# Patient Record
Sex: Male | Born: 1960
Health system: Southern US, Community
[De-identification: ages and names within clinical notes are randomized; demographics above are authoritative.]

## PROBLEM LIST (undated history)

## (undated) DIAGNOSIS — M1990 Unspecified osteoarthritis, unspecified site: Secondary | ICD-10-CM

## (undated) DIAGNOSIS — I739 Peripheral vascular disease, unspecified: Secondary | ICD-10-CM

## (undated) DIAGNOSIS — F40240 Claustrophobia: Secondary | ICD-10-CM

## (undated) DIAGNOSIS — T8189XA Other complications of procedures, not elsewhere classified, initial encounter: Secondary | ICD-10-CM

## (undated) DIAGNOSIS — J449 Chronic obstructive pulmonary disease, unspecified: Secondary | ICD-10-CM

## (undated) DIAGNOSIS — K469 Unspecified abdominal hernia without obstruction or gangrene: Secondary | ICD-10-CM

## (undated) DIAGNOSIS — E119 Type 2 diabetes mellitus without complications: Secondary | ICD-10-CM

## (undated) DIAGNOSIS — J40 Bronchitis, not specified as acute or chronic: Secondary | ICD-10-CM

## (undated) HISTORY — PX: CARPAL TUNNEL RELEASE: SHX101

## (undated) HISTORY — PX: SHOULDER SURGERY: SHX246

---

## 2010-08-06 ENCOUNTER — Emergency Department (HOSPITAL_COMMUNITY): Admission: EM | Admit: 2010-08-06 | Discharge: 2010-08-06 | Payer: Self-pay | Admitting: Emergency Medicine

## 2011-03-17 ENCOUNTER — Emergency Department (HOSPITAL_COMMUNITY)
Admission: EM | Admit: 2011-03-17 | Discharge: 2011-03-17 | Disposition: A | Discharge status: Home / Self Care | Payer: Self-pay | Attending: Emergency Medicine | Admitting: Emergency Medicine

## 2011-03-17 DIAGNOSIS — S43429A Sprain of unspecified rotator cuff capsule, initial encounter: Secondary | ICD-10-CM | POA: Insufficient documentation

## 2011-03-17 DIAGNOSIS — E119 Type 2 diabetes mellitus without complications: Secondary | ICD-10-CM | POA: Insufficient documentation

## 2011-03-17 DIAGNOSIS — M25519 Pain in unspecified shoulder: Secondary | ICD-10-CM | POA: Insufficient documentation

## 2013-01-01 DIAGNOSIS — N476 Balanoposthitis: Secondary | ICD-10-CM | POA: Insufficient documentation

## 2014-09-05 DIAGNOSIS — E782 Mixed hyperlipidemia: Secondary | ICD-10-CM | POA: Insufficient documentation

## 2014-09-05 DIAGNOSIS — F17200 Nicotine dependence, unspecified, uncomplicated: Secondary | ICD-10-CM | POA: Insufficient documentation

## 2014-09-05 DIAGNOSIS — IMO0002 Reserved for concepts with insufficient information to code with codable children: Secondary | ICD-10-CM | POA: Insufficient documentation

## 2014-09-05 DIAGNOSIS — E1149 Type 2 diabetes mellitus with other diabetic neurological complication: Secondary | ICD-10-CM | POA: Insufficient documentation

## 2014-09-10 DIAGNOSIS — E8881 Metabolic syndrome: Secondary | ICD-10-CM | POA: Insufficient documentation

## 2015-01-24 DIAGNOSIS — F325 Major depressive disorder, single episode, in full remission: Secondary | ICD-10-CM | POA: Insufficient documentation

## 2016-12-15 DIAGNOSIS — G619 Inflammatory polyneuropathy, unspecified: Secondary | ICD-10-CM | POA: Insufficient documentation

## 2017-04-14 DIAGNOSIS — F324 Major depressive disorder, single episode, in partial remission: Secondary | ICD-10-CM | POA: Diagnosis not present

## 2017-04-14 DIAGNOSIS — E1142 Type 2 diabetes mellitus with diabetic polyneuropathy: Secondary | ICD-10-CM | POA: Diagnosis not present

## 2017-04-14 DIAGNOSIS — E782 Mixed hyperlipidemia: Secondary | ICD-10-CM | POA: Diagnosis not present

## 2017-04-14 DIAGNOSIS — E8881 Metabolic syndrome: Secondary | ICD-10-CM | POA: Diagnosis not present

## 2017-04-17 DIAGNOSIS — E8881 Metabolic syndrome: Secondary | ICD-10-CM | POA: Diagnosis not present

## 2017-04-17 DIAGNOSIS — F17210 Nicotine dependence, cigarettes, uncomplicated: Secondary | ICD-10-CM | POA: Diagnosis not present

## 2017-04-17 DIAGNOSIS — E668 Other obesity: Secondary | ICD-10-CM | POA: Diagnosis not present

## 2017-04-17 DIAGNOSIS — E1165 Type 2 diabetes mellitus with hyperglycemia: Secondary | ICD-10-CM | POA: Diagnosis not present

## 2017-04-17 DIAGNOSIS — E782 Mixed hyperlipidemia: Secondary | ICD-10-CM | POA: Diagnosis not present

## 2017-07-24 ENCOUNTER — Emergency Department (HOSPITAL_COMMUNITY)
Admission: EM | Admit: 2017-07-24 | Discharge: 2017-07-24 | Disposition: A | Discharge status: Home / Self Care | Payer: BLUE CROSS/BLUE SHIELD | Attending: Emergency Medicine | Admitting: Emergency Medicine

## 2017-07-24 ENCOUNTER — Emergency Department (HOSPITAL_COMMUNITY): Payer: BLUE CROSS/BLUE SHIELD

## 2017-07-24 ENCOUNTER — Encounter (HOSPITAL_COMMUNITY): Payer: Self-pay | Admitting: *Deleted

## 2017-07-24 DIAGNOSIS — M25511 Pain in right shoulder: Secondary | ICD-10-CM

## 2017-07-24 DIAGNOSIS — E119 Type 2 diabetes mellitus without complications: Secondary | ICD-10-CM | POA: Insufficient documentation

## 2017-07-24 DIAGNOSIS — F17210 Nicotine dependence, cigarettes, uncomplicated: Secondary | ICD-10-CM | POA: Insufficient documentation

## 2017-07-24 DIAGNOSIS — R52 Pain, unspecified: Secondary | ICD-10-CM

## 2017-07-24 HISTORY — DX: Type 2 diabetes mellitus without complications: E11.9

## 2017-07-24 MED ORDER — HYDROCODONE-ACETAMINOPHEN 5-325 MG PO TABS: 1.0000 | ORAL_TABLET | Freq: Four times a day (QID) | ORAL | 0 refills | Status: DC | PRN

## 2017-07-24 MED ORDER — HYDROCODONE-ACETAMINOPHEN 5-325 MG PO TABS
1.0000 | ORAL_TABLET | Freq: Once | ORAL | Status: AC
  Administered 2017-07-24: 1 via ORAL
  Filled 2017-07-24: qty 1

## 2017-07-24 NOTE — Discharge Instructions (Signed)
Take Tylenol for mild pain or the pain medicine prescribed for severe pain. Don't take Tylenol together with the pain medicine prescribed as the combination can be dangerous to your liver. Call your primary care physician on Monday, 07/28/2017 if severe pain or limited motion of your shoulder continues. You may need referral to an orthopedic specialist

## 2017-07-24 NOTE — ED Provider Notes (Signed)
[facility] DEPT Provider Note   CSN: [id number] Arrival date & time: 07/24/17  1806     History   Chief Complaint Chief Complaint  Patient presents with  . Shoulder Pain    HPI Derrick Clark is a 56 y.o. male. [place] of right shoulder pain onset 4 days ago. Pain is overlying the deltoid area. He has exquisite pain on abduction of right shoulder and cannot fully abduct right shoulder due to pain. Pain is improved with holding his shoulder still. He's treated himself with ibuprofen without adequate relief. He is uncertain about weakness. No injury no fever no other associated symptoms. No neck pain pain is not exacerbated by moving his neck. No numbness. No trauma. HPI  Past Medical History:  Diagnosis Date  . Diabetes mellitus without complication ([place])     There are no active problems to display for this patient.   History reviewed. No pertinent surgical history.     Home Medications    Prior to Admission medications   Not on File    Family History No family history on file.  Social History Social History  Substance Use Topics  . Smoking status: Current Every Day Smoker    Packs/day: 0.75    Types: Cigarettes  . Smokeless tobacco: Never Used  . Alcohol use No     Allergies   Jardiance [empagliflozin] and Sulfa antibiotics   Review of Systems Review of Systems  Musculoskeletal: Positive for arthralgias.       Right shoulder pain  Allergic/Immunologic: Positive for immunocompromised state.       Diabetic  All other systems reviewed and are negative.    Physical Exam Updated Vital Signs BP 134/81   Pulse 64   Temp 98.4 F (36.9 C) (Oral)   Resp 16   Ht 5\' 10"  (1.778 m)   Wt 127 kg (280 lb)   SpO2 96%   BMI 40.18 kg/m   Physical Exam  Constitutional: He appears well-developed and well-nourished.  HENT:  Head: Normocephalic and atraumatic.  Eyes: Pupils are equal, round, and reactive to light. Conjunctivae are normal.  Neck: Neck  supple. No tracheal deviation present. No thyromegaly present.  Cardiovascular: Normal rate and regular rhythm.   No murmur heard. Pulmonary/Chest: Effort normal and breath sounds normal.  Abdominal: Soft. Bowel sounds are normal. He exhibits no distension. There is no tenderness.  Musculoskeletal: Normal range of motion. He exhibits no edema or tenderness.  Right upper extremity without redness swelling or muscular atrophy. Limited abduction secondary to pain. He is tender over lying deltoid area. Radial pulse 2+. Good capillary refill. Grip strength 5 over 5 bilaterally in all other extremities without redness swelling or tenderness neurovascularly intact  Neurological: He is alert. Coordination normal.  Skin: Skin is warm and dry. No rash noted.  Psychiatric: He has a normal mood and affect.  Nursing note and vitals reviewed.    ED Treatments / Results  Labs (all labs ordered are listed, but only abnormal results are displayed) Labs Reviewed - No data to display  EKG  EKG Interpretation None       Radiology No results found.  Procedures Procedures (including critical care time)  Medications Ordered in ED Medications - No data to display   Initial Impression / Assessment and Plan / ED Course  I have reviewed the triage vital signs and the nursing notes.  Pertinent labs & imaging results that were available during my care of the patient were reviewed by me and  considered in my medical decision making (see chart for details).    No results found for this or any previous visit. Dg Shoulder Right  Result Date: 07/24/2017 CLINICAL DATA:  Right shoulder pain for 1 week, no known injury, initial encounter EXAM: RIGHT SHOULDER - 2+ VIEW COMPARISON:  None. FINDINGS: Degenerative changes of the acromioclavicular joint are seen. No acute fracture or dislocation is noted. Underlying bony thorax is within normal limits. No gross soft tissue abnormality is seen. IMPRESSION: No acute  abnormality noted. Electronically Signed   By: Inez Catalina M.D.   On: 07/24/2017 20:57  X-ray viewed by me. Symptoms consistent with degenerative arthritis. I strongly doubt cervical radiculopathy. Plan prescription [place] Controlled Substance reporting System queried Follow-up with primary care physician in 3 days Final Clinical Impressions(s) / ED Diagnoses  Diagnosis right shoulder pain Final diagnoses:  Pain    New Prescriptions New Prescriptions   No medications on file     Orlie Dakin, MD 07/24/17 2133

## 2017-07-24 NOTE — ED Notes (Signed)
R shoulder pain

## 2017-07-24 NOTE — ED Triage Notes (Signed)
Pt c/o right shoulder pain and weakness x 1 week. Denies injury. Pt reports the pain goes up into his neck and down to his elbow. Pt has limited movement of left arm.

## 2017-08-18 DIAGNOSIS — E1142 Type 2 diabetes mellitus with diabetic polyneuropathy: Secondary | ICD-10-CM | POA: Diagnosis not present

## 2017-08-18 DIAGNOSIS — E1165 Type 2 diabetes mellitus with hyperglycemia: Secondary | ICD-10-CM | POA: Diagnosis not present

## 2017-08-18 DIAGNOSIS — E8881 Metabolic syndrome: Secondary | ICD-10-CM | POA: Diagnosis not present

## 2017-08-18 DIAGNOSIS — E782 Mixed hyperlipidemia: Secondary | ICD-10-CM | POA: Diagnosis not present

## 2017-08-21 DIAGNOSIS — F17210 Nicotine dependence, cigarettes, uncomplicated: Secondary | ICD-10-CM | POA: Diagnosis not present

## 2017-08-21 DIAGNOSIS — M545 Low back pain: Secondary | ICD-10-CM | POA: Diagnosis not present

## 2017-08-21 DIAGNOSIS — E8881 Metabolic syndrome: Secondary | ICD-10-CM | POA: Diagnosis not present

## 2017-08-21 DIAGNOSIS — E1165 Type 2 diabetes mellitus with hyperglycemia: Secondary | ICD-10-CM | POA: Diagnosis not present

## 2017-08-21 DIAGNOSIS — E782 Mixed hyperlipidemia: Secondary | ICD-10-CM | POA: Diagnosis not present

## 2017-09-16 DIAGNOSIS — M719 Bursopathy, unspecified: Secondary | ICD-10-CM | POA: Diagnosis not present

## 2017-09-16 DIAGNOSIS — M25511 Pain in right shoulder: Secondary | ICD-10-CM | POA: Diagnosis not present

## 2017-09-16 DIAGNOSIS — M67919 Unspecified disorder of synovium and tendon, unspecified shoulder: Secondary | ICD-10-CM | POA: Diagnosis not present

## 2017-09-16 DIAGNOSIS — IMO0002 Reserved for concepts with insufficient information to code with codable children: Secondary | ICD-10-CM | POA: Insufficient documentation

## 2017-09-22 DIAGNOSIS — M7551 Bursitis of right shoulder: Secondary | ICD-10-CM | POA: Diagnosis not present

## 2017-09-22 DIAGNOSIS — M19011 Primary osteoarthritis, right shoulder: Secondary | ICD-10-CM | POA: Diagnosis not present

## 2017-09-22 DIAGNOSIS — M7581 Other shoulder lesions, right shoulder: Secondary | ICD-10-CM | POA: Diagnosis not present

## 2017-09-30 DIAGNOSIS — Z6841 Body Mass Index (BMI) 40.0 and over, adult: Secondary | ICD-10-CM | POA: Diagnosis not present

## 2017-09-30 DIAGNOSIS — J189 Pneumonia, unspecified organism: Secondary | ICD-10-CM | POA: Diagnosis not present

## 2017-10-06 DIAGNOSIS — J189 Pneumonia, unspecified organism: Secondary | ICD-10-CM | POA: Diagnosis not present

## 2017-10-06 DIAGNOSIS — Z6841 Body Mass Index (BMI) 40.0 and over, adult: Secondary | ICD-10-CM | POA: Diagnosis not present

## 2017-10-21 DIAGNOSIS — M7542 Impingement syndrome of left shoulder: Secondary | ICD-10-CM | POA: Diagnosis not present

## 2017-10-21 DIAGNOSIS — M75121 Complete rotator cuff tear or rupture of right shoulder, not specified as traumatic: Secondary | ICD-10-CM | POA: Diagnosis not present

## 2017-10-21 DIAGNOSIS — M25511 Pain in right shoulder: Secondary | ICD-10-CM | POA: Diagnosis not present

## 2017-10-26 DIAGNOSIS — Z23 Encounter for immunization: Secondary | ICD-10-CM | POA: Diagnosis not present

## 2017-11-12 DIAGNOSIS — E785 Hyperlipidemia, unspecified: Secondary | ICD-10-CM | POA: Diagnosis not present

## 2017-11-12 DIAGNOSIS — M7522 Bicipital tendinitis, left shoulder: Secondary | ICD-10-CM | POA: Diagnosis not present

## 2017-11-12 DIAGNOSIS — F17210 Nicotine dependence, cigarettes, uncomplicated: Secondary | ICD-10-CM | POA: Diagnosis not present

## 2017-11-12 DIAGNOSIS — M75122 Complete rotator cuff tear or rupture of left shoulder, not specified as traumatic: Secondary | ICD-10-CM | POA: Diagnosis not present

## 2017-11-12 DIAGNOSIS — M1990 Unspecified osteoarthritis, unspecified site: Secondary | ICD-10-CM | POA: Diagnosis not present

## 2017-11-12 DIAGNOSIS — F419 Anxiety disorder, unspecified: Secondary | ICD-10-CM | POA: Diagnosis not present

## 2017-11-12 DIAGNOSIS — Z888 Allergy status to other drugs, medicaments and biological substances status: Secondary | ICD-10-CM | POA: Diagnosis not present

## 2017-11-12 DIAGNOSIS — I10 Essential (primary) hypertension: Secondary | ICD-10-CM | POA: Diagnosis not present

## 2017-11-12 DIAGNOSIS — M7542 Impingement syndrome of left shoulder: Secondary | ICD-10-CM | POA: Diagnosis not present

## 2017-11-12 DIAGNOSIS — Z882 Allergy status to sulfonamides status: Secondary | ICD-10-CM | POA: Diagnosis not present

## 2017-11-12 DIAGNOSIS — E119 Type 2 diabetes mellitus without complications: Secondary | ICD-10-CM | POA: Diagnosis not present

## 2017-11-13 DIAGNOSIS — M7522 Bicipital tendinitis, left shoulder: Secondary | ICD-10-CM | POA: Diagnosis not present

## 2017-11-13 DIAGNOSIS — E119 Type 2 diabetes mellitus without complications: Secondary | ICD-10-CM | POA: Diagnosis not present

## 2017-11-13 DIAGNOSIS — M75122 Complete rotator cuff tear or rupture of left shoulder, not specified as traumatic: Secondary | ICD-10-CM | POA: Diagnosis not present

## 2017-11-13 DIAGNOSIS — F17210 Nicotine dependence, cigarettes, uncomplicated: Secondary | ICD-10-CM | POA: Diagnosis not present

## 2017-11-13 DIAGNOSIS — M7542 Impingement syndrome of left shoulder: Secondary | ICD-10-CM | POA: Diagnosis not present

## 2017-11-13 DIAGNOSIS — E785 Hyperlipidemia, unspecified: Secondary | ICD-10-CM | POA: Diagnosis not present

## 2017-11-13 DIAGNOSIS — I10 Essential (primary) hypertension: Secondary | ICD-10-CM | POA: Diagnosis not present

## 2017-11-13 DIAGNOSIS — M1990 Unspecified osteoarthritis, unspecified site: Secondary | ICD-10-CM | POA: Diagnosis not present

## 2017-11-13 DIAGNOSIS — Z888 Allergy status to other drugs, medicaments and biological substances status: Secondary | ICD-10-CM | POA: Diagnosis not present

## 2017-11-13 DIAGNOSIS — F419 Anxiety disorder, unspecified: Secondary | ICD-10-CM | POA: Diagnosis not present

## 2017-11-13 DIAGNOSIS — Z882 Allergy status to sulfonamides status: Secondary | ICD-10-CM | POA: Diagnosis not present

## 2017-11-23 DIAGNOSIS — Z9889 Other specified postprocedural states: Secondary | ICD-10-CM | POA: Diagnosis not present

## 2017-12-01 ENCOUNTER — Encounter (HOSPITAL_COMMUNITY): Payer: Self-pay

## 2017-12-01 ENCOUNTER — Ambulatory Visit (HOSPITAL_COMMUNITY): Payer: BLUE CROSS/BLUE SHIELD | Attending: Orthopedic Surgery

## 2017-12-01 DIAGNOSIS — M25612 Stiffness of left shoulder, not elsewhere classified: Secondary | ICD-10-CM | POA: Insufficient documentation

## 2017-12-01 DIAGNOSIS — R29898 Other symptoms and signs involving the musculoskeletal system: Secondary | ICD-10-CM | POA: Insufficient documentation

## 2017-12-01 DIAGNOSIS — M25512 Pain in left shoulder: Secondary | ICD-10-CM

## 2017-12-01 NOTE — Therapy (Signed)
Derrick Clark, [place], [address] Phone: [phone]   Fax:  [phone]  Occupational Therapy Evaluation  Patient Details  Name: Derrick Clark MRN: [id number] Date of Birth: February 10, 1961 Referring Provider: Remo Lipps Case, MD   Encounter Date: 12/01/2017  OT End of Session - 12/01/17 1539    Visit Number  1    Number of Visits  24    Date for OT Re-Evaluation  01/30/18 mini reassess: 12/30/17    Authorization Type  BC[employer] other     Authorization Time Period  30 visits combined OT/chiropractor 0 used.    Authorization - Visit Number  1    Authorization - Number of Visits  30    OT Start Time  1430    OT Stop Time  1515    OT Time Calculation (min)  45 min    Activity Tolerance  Patient tolerated treatment well    Behavior During Therapy  WFL for tasks assessed/performed       Past Medical History:  Diagnosis Date  . Diabetes mellitus without complication ([place])     History reviewed. No pertinent surgical history.  There were no vitals filed for this visit.  Subjective Assessment - 12/01/17 1440    Subjective   S: I think it was just from work and overuse.    Pertinent History  Patient is a 57 y/o male S/P left RCR which was completed on 11/13/17. Pt is currently wearing sling upon arrival to therapy. Dr. Case has referred patient to occupational therapy for evaluation and treatment.     Special Tests  FOTO score: 36/100     Patient Stated Goals  To return to work.    Currently in Pain?  Yes    Pain Score  4     Pain Location  Shoulder    Pain Orientation  Right    Pain Descriptors / Indicators  Dull    Pain Type  Surgical pain    Pain Radiating Towards  down to elbow    Pain Onset  1 to 4 weeks ago    Pain Frequency  Constant    Aggravating Factors   quick movements.     Pain Relieving Factors  rest. sleep, pain medication, ice pack    Effect of Pain on Daily Activities  Pt is unable to use LUE right now for any  tasks.     Multiple Pain Sites  No        [facility] OT Assessment - 12/01/17 1443      Assessment   Diagnosis  Left RCR    Referring Provider  Remo Lipps Case, MD    Onset Date  11/13/17 surgery    Assessment  12/23/17 - follow up with Case    Prior Therapy  None      Precautions   Precautions  Shoulder    Type of Shoulder Precautions  See protocol from Dr. 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      ADL   ADL comments  Pt is unable to use his left UE for any daily tasks at this time.       Mobility   Mobility Status  Independent      Written Expression   Dominant Hand  Right      Vision - History   Baseline Vision  No visual deficits      Cognition   Overall Cognitive Status  Within Functional Limits for tasks assessed      ROM / Strength   AROM / PROM / Strength  AROM;PROM;Strength      Palpation   Palpation comment  Mod fascial restrictions in left upper arm, trapezius, and scapularis region.       AROM   Overall AROM   Unable to assess;Due to precautions    AROM Assessment Site  Shoulder      PROM   Overall PROM Comments  Assessed supine. IR/er adducted.    PROM Assessment Site  Shoulder    Right/Left Shoulder  Left    Left Shoulder Flexion  114 Degrees    Left Shoulder ABduction  60 Degrees per protocol    Left Shoulder Internal Rotation  90 Degrees    Left Shoulder External Rotation  40 Degrees      Strength   Overall Strength  Unable to assess;Due to precautions                       OT Education - 12/01/17 1538    Education provided  Yes    Education Details  table slides, Wrist and elbow A/ROM, pendulums    Person(s) Educated  Patient    Methods  Explanation;Demonstration;Tactile cues;Verbal cues;Handout    Comprehension  Returned demonstration;Verbalized understanding       OT Short Term Goals - 12/01/17 1545      OT SHORT TERM GOAL #1   Title  Patient will be educated and independent with HEP to increase functional use of LUE during daily tasks.     Time  6    Period  Weeks    Status  New    Target Date  01/12/18      OT SHORT TERM GOAL #2   Title  patient will increase P/ROM of LUE to [facility] in order to increase ability to complete dressing tasks.    Time  6    Period  Weeks    Status  New      OT SHORT TERM GOAL #3   Title  Patient will increase LUE strength to 3+/5 to increase ability to complete waist level activities.     Time  6    Period  Weeks    Status  New      OT SHORT TERM GOAL #4   Title  Patient will decrease pain level in LUE during daily tasks to 3/10 or less.     Time  6    Period  Weeks    Status  New      OT SHORT TERM GOAL #5   Title  Patient will decrease fascial restrictions in LUE to min amount in order to increase functional mobility needed for reaching tasks.     Time  6    Period  Weeks    Status  New        OT Long Term Goals - 12/01/17  Reader #1   Title  Patient will return to highest level of independence using his LUE for all daily tasks.     Time  12    Period  Weeks    Status  New    Target Date  02/23/18      OT LONG TERM GOAL #2   Title  Patient will increase A/ROM to WNL to increase ability to complete overhead reaching tasks.     Time  12    Period  Weeks    Status  New      OT LONG TERM GOAL #3   Title  Patient will increase LUE strength to 4+/5 to increase ability to return to work related tasks.     Time  12    Period  Weeks    Status   New      OT LONG TERM GOAL #4   Title  Patient will decrease pain level to 2/10 or less when completing daily tasks.     Time  12    Period  Weeks    Status  New      OT LONG TERM GOAL #5   Title  Patient to decrease fascial restrictions in LUE to trace amount in order to increase functional mobility needed to return to reaching tasks.     Time  12    Period  Weeks    Status  New            Plan - 12/01/17 1541    Clinical Impression Statement  A: patient is a 56 y/o male S/P left RCR causing increased pain, fascial restrictions, and decreased ROM and strengthening resulting in difficulty completing daily tasks with LUE.     Occupational Profile and client history currently impacting functional performance  motivated to return to prior level of function, strong social support at home.     Occupational performance deficits (Please refer to evaluation for details):  ADL's;Rest and Sleep;Work;Leisure;IADL's    Rehab Potential  Excellent    Current Impairments/barriers affecting progress:  None    OT Frequency  2x / week    OT Duration  12 weeks    OT Treatment/Interventions  Self-care/ADL training;Ultrasound;DME and/or AE instruction;Patient/family education;Passive range of motion;Cryotherapy;Electrical Stimulation;Moist Heat;Manual Therapy;Therapeutic activities;Therapeutic exercise    Plan  P: Patient will benefit from skilled OT services to increase functional performance during daily tasks using his LUE. Treatment plan: follow protocol from Dr. Case. myofascial release, manual stretching, P/ROM, AA/ROM, A/ROM, and general strengthening.     Clinical Decision Making  Limited treatment options, no task modification necessary    OT Home Exercise Plan  12/4: table slides, elbow/wrist A/ROM, pendulums    Consulted and Agree with Plan of Care  Patient       Patient will benefit from skilled therapeutic intervention in order to improve the following deficits and impairments:   Decreased strength, Decreased range of motion, Pain, Impaired UE functional use, Increased fascial restrictions  Visit Diagnosis: Acute pain of left shoulder - Plan: Ot plan of care cert/re-cert  Other symptoms and signs involving the musculoskeletal system - Plan: Ot plan of care cert/re-cert  Stiffness of left shoulder, not elsewhere classified - Plan: Ot plan of care cert/re-cert  G-Codes - 09/32/67 1554    Functional Assessment Tool Used (Outpatient only)  FOTO score: 36/100 (64% impaired)    Functional Limitation  Carrying, moving and handling objects  Carrying, Moving and Handling Objects Current Status [phone])  At least 60 percent but less than 80 percent impaired, limited or restricted    Carrying, Moving and Handling Objects Goal Status (M7672)  At least 20 percent but less than 40 percent impaired, limited or restricted       Problem List There are no active problems to display for this patient.  Ailene Ravel, OTR/L,CBIS  [phone]  12/01/2017, 3:56 PM  [facility] [address] [place], [place], [address] Phone: [phone]   Fax:  [phone]  Name: Derrick Clark MRN: [id number] Date of Birth: [birth year]

## 2017-12-01 NOTE — Patient Instructions (Signed)
TOWEL SLIDES COMPLETE FOR 1-3 MINUTES, 3-5 TIMES PER DAY  SHOULDER: Flexion On Table   Place hands on table, elbows straight. Move hips away from body. Press hands down into table. Hold ___ seconds. ___ reps per set, ___ sets per day, ___ days per week     Internal Rotation (Assistive)   Seated with elbow bent at right angle and held against side, slide arm on table surface in an inward arc. Repeat ____ times. Do ____ sessions per day. Activity: Use this motion to brush crumbs off the table.  Copyright  VHI. All rights reserved.    COMPLETE PENDULUM EXERCISES FOR 30 SECONDS TO A MINUTE EACH, 3-5 TIMES PER DAY. ROM: Pendulum (Side-to-Side)   Deje que el brazo derecho se balancee suavemente de lado a lado mientras oscila el cuerpo de [place]. Repita ____ veces por rutina. Realice ____ rutinas por sesin. Realice ____ sesiones por da.  http://orth.exer.us/792   Copyright  VHI. All rights reserved.  Pendulum Forward/Back   Bend forward 90 at waist, using table for support. Rock body forward and back to swing arm. Repeat ____ times. Do ____ sessions per day.   AROM: Wrist Extension   With right palm down, bend wrist up. Repeat 10____ times per set. Do ____ sets per session. Do __3__ sessions per day.  Copyright  VHI. All rights reserved.   AROM: Wrist Flexion   With right palm up, bend wrist up. Repeat ___10_ times per set. Do ____ sets per session. Do __3__ sessions per day.  Copyright  VHI. All rights reserved.   AROM: Forearm Pronation / Supination   With right arm in handshake position, slowly rotate palm down until stretch is felt. Relax. Then rotate palm up until stretch is felt. Repeat __10__ times per set. Do ____ sets per session. Do __3__ sessions per day.  Copyright  VHI. All rights reserved.   AFlexion (Passive)   Use other hand to bend elbow, with thumb toward same shoulder. Do NOT force this motion. Hold ____ seconds. Repeat ____  times. Do ____ sessions per day.

## 2017-12-02 ENCOUNTER — Ambulatory Visit (HOSPITAL_COMMUNITY): Payer: BLUE CROSS/BLUE SHIELD

## 2017-12-02 DIAGNOSIS — R29898 Other symptoms and signs involving the musculoskeletal system: Secondary | ICD-10-CM

## 2017-12-02 DIAGNOSIS — M25612 Stiffness of left shoulder, not elsewhere classified: Secondary | ICD-10-CM | POA: Diagnosis not present

## 2017-12-02 DIAGNOSIS — M25512 Pain in left shoulder: Secondary | ICD-10-CM

## 2017-12-03 NOTE — Therapy (Signed)
[facility] [place], [place], [address] Phone: [phone]   Fax:  [phone]  Occupational Therapy Treatment  Patient Details  Name: Derrick Clark MRN: [id number] Date of Birth: [birth year] Referring Provider: Remo Lipps Case, MD   Encounter Date: 12/02/2017  OT End of Session - 12/02/17 6384    Visit Number  2    Number of Visits  24    Date for OT Re-Evaluation  01/30/18 mini reassess: 12/30/17    Authorization Type  BCBS other     Authorization Time Period  30 visits combined OT/chiropractor 0 used.    Authorization - Visit Number  2    Authorization - Number of Visits  30    OT Start Time  1520    OT Stop Time  1600    OT Time Calculation (min)  40 min    Activity Tolerance  Patient tolerated treatment well    Behavior During Therapy  WFL for tasks assessed/performed       Past Medical History:  Diagnosis Date  . Diabetes mellitus without complication ([place])     No past surgical history on file.  There were no vitals filed for this visit.  Subjective Assessment - 12/02/17 0818    Subjective   S: I did the exercises yesterday.    Currently in Pain?  Yes    Pain Score  5     Pain Location  Shoulder    Pain Orientation  Right    Pain Descriptors / Indicators  Dull    Pain Type  Surgical pain         [facility] OT Assessment - 12/02/17 0819      Assessment   Diagnosis  Left RCR      Precautions   Precautions  Shoulder    Type of Shoulder Precautions  See protocol from Dr. Remo Lipps Case    Shoulder Interventions  Shoulder sling/immobilizer               OT Treatments/Exercises (OP) - 12/02/17 1546      Exercises   Exercises  Shoulder      Shoulder Exercises: Supine   Protraction  PROM;10 reps    Horizontal ABduction  PROM;10 reps    External Rotation  PROM;10 reps    Internal Rotation  PROM;10 reps    Flexion  PROM;10 reps    ABduction  PROM;10 reps      Shoulder Exercises: Seated   Extension   AROM;10 reps    Row  AROM;10 reps    Other Seated Exercises  Shoulder depression; 10X      Shoulder Exercises: Therapy Ball   Flexion  10 reps    ABduction  10 reps protocol      Shoulder Exercises: ROM/Strengthening   Thumb Tacks  1'    Anterior Glide  3x10"    Caudal Glide  3x10"      Manual Therapy   Manual Therapy  Myofascial release    Manual therapy comments  Manual therapy completed prior to exercises    Myofascial Release  Myofascial release and manual stretching completed to left upper arm, trapezius, and scapularis region to decrease fascial restrictions and increase joint mobility in a pain free zone.              OT Education - 12/02/17 0820    Education provided  Yes    Education Details  pt provided with OT evaluation print out. Reviewed goals  and plan of care.    Person(s) Educated  Patient    Methods  Explanation;Handout    Comprehension  Verbalized understanding       OT Short Term Goals - 12/02/17 0824      OT SHORT TERM GOAL #1   Title  Patient will be educated and independent with HEP to increase functional use of LUE during daily tasks.     Time  6    Period  Weeks    Status  On-going      OT SHORT TERM GOAL #2   Title  patient will increase P/ROM of LUE to [facility] in order to increase ability to complete dressing tasks.    Time  6    Period  Weeks    Status  On-going      OT SHORT TERM GOAL #3   Title  Patient will increase LUE strength to 3+/5 to increase ability to complete waist level activities.     Time  6    Period  Weeks    Status  On-going      OT SHORT TERM GOAL #4   Title  Patient will decrease pain level in LUE during daily tasks to 3/10 or less.     Time  6    Period  Weeks    Status  On-going      OT SHORT TERM GOAL #5   Title  Patient will decrease fascial restrictions in LUE to min amount in order to increase functional mobility needed for reaching tasks.     Time  6    Period  Weeks    Status  On-going        OT  Long Term Goals - 12/03/17 0825      OT LONG TERM GOAL #1   Title  Patient will return to highest level of independence using his LUE for all daily tasks.     Time  12    Period  Weeks    Status  On-going      OT LONG TERM GOAL #2   Title  Patient will increase A/ROM to WNL to increase ability to complete overhead reaching tasks.     Time  12    Period  Weeks    Status  On-going      OT LONG TERM GOAL #3   Title  Patient will increase LUE strength to 4+/5 to increase ability to return to work related tasks.     Time  12    Period  Weeks    Status  On-going      OT LONG TERM GOAL #4   Title  Patient will decrease pain level to 2/10 or less when completing daily tasks.     Time  12    Period  Weeks    Status  On-going      OT LONG TERM GOAL #5   Title  Patient to decrease fascial restrictions in LUE to trace amount in order to increase functional mobility needed to return to reaching tasks.     Time  12    Period  Weeks    Status  On-going            Plan - 12/02/17 5631    Clinical Impression Statement  A: Initiated myofascial release, manual stretching, therapy ball stretching following protocol. Patient was able to achieve more passive ROM for shoulder flexion this session. VC for form and technique.    Plan  P: Add pro/ret/elev/dep       Patient will benefit from skilled therapeutic intervention in order to improve the following deficits and impairments:  Decreased strength, Decreased range of motion, Pain, Impaired UE functional use, Increased fascial restrictions  Visit Diagnosis: Acute pain of left shoulder  Other symptoms and signs involving the musculoskeletal system  Stiffness of left shoulder, not elsewhere classified    Problem List There are no active problems to display for this patient.  Ailene Ravel, OTR/L,CBIS  [phone]  12/03/2017, 10:10 AM  [facility] [address] [place],  [place], [address] Phone: [phone]   Fax:  [phone]  Name: Derrick Clark MRN: [id number] Date of Birth: October 03, 1961

## 2017-12-09 ENCOUNTER — Ambulatory Visit (HOSPITAL_COMMUNITY): Payer: BLUE CROSS/BLUE SHIELD

## 2017-12-09 ENCOUNTER — Encounter (HOSPITAL_COMMUNITY): Payer: Self-pay

## 2017-12-09 DIAGNOSIS — M25612 Stiffness of left shoulder, not elsewhere classified: Secondary | ICD-10-CM | POA: Diagnosis not present

## 2017-12-09 DIAGNOSIS — R29898 Other symptoms and signs involving the musculoskeletal system: Secondary | ICD-10-CM

## 2017-12-09 DIAGNOSIS — M25512 Pain in left shoulder: Secondary | ICD-10-CM | POA: Diagnosis not present

## 2017-12-09 NOTE — Therapy (Signed)
[place], [place], [address] Phone: [phone]   Fax:  [phone]  Occupational Therapy Treatment  Patient Details  Name: Derrick Clark MRN: [id number] Date of Birth: 1961/09/27 Referring Provider (Historical): Remo Lipps Case, MD   Encounter Date: 12/09/2017  OT End of Session - 12/09/17 1723    Visit Number  3    Number of Visits  24    Date for OT Re-Evaluation  01/30/18 mini reassess: 12/30/17    Authorization Type  [employer] other     Authorization Time Period  30 visits combined OT/chiropractor 0 used.    Authorization - Visit Number  3    Authorization - Number of Visits  30    OT Start Time  2563    OT Stop Time  8937    OT Time Calculation (min)  30 min    Activity Tolerance  Patient tolerated treatment well    Behavior During Therapy  WFL for tasks assessed/performed       Past Medical History:  Diagnosis Date  . Diabetes mellitus without complication ([place])     History reviewed. No pertinent surgical history.  There were no vitals filed for this visit.  Subjective Assessment - 12/09/17 1704    Subjective   S: Nothing new to report.    Currently in Pain?  Yes    Pain Score  6     Pain Location  Shoulder    Pain Orientation  Left    Pain Descriptors / Indicators  Dull    Pain Type  Surgical pain    Pain Radiating Towards  down to elbow    Pain Onset  1 to 4 weeks ago    Pain Frequency  Constant    Aggravating Factors   quick movements    Pain Relieving Factors  rest, sleep, pain medication, ice pack    Effect of Pain on Daily Activities  Unable to use LUE for daily tasks at this time.                   OT Treatments/Exercises (OP) - 12/09/17 1706      Exercises   Exercises  Shoulder      Shoulder Exercises: Supine   Protraction  PROM;10 reps    Horizontal ABduction  PROM;10 reps    External Rotation  PROM;10 reps    Internal Rotation  PROM;10 reps    Flexion  PROM;10 reps     ABduction  PROM;10 reps      Shoulder Exercises: Seated   Extension  AROM;12 reps    Row  AROM;12 reps    Other Seated Exercises  Shoulder depression; 12X      Shoulder Exercises: Therapy Ball   Flexion  10 reps    ABduction  10 reps protocol      Shoulder Exercises: ROM/Strengthening   Thumb Tacks  1'    Anterior Glide  3x10"    Caudal Glide  3x10"    Prot/Ret//Elev/Dep  1'      Shoulder Exercises: Isometric Strengthening   Flexion  Supine;3X5"    Extension  Supine;3X5"    External Rotation  Supine;3X5"    Internal Rotation  Supine;3X5"    ABduction  Supine;3X5"    ADduction  Supine;3X5"      Manual Therapy   Manual Therapy  Myofascial release    Manual therapy comments  Manual therapy completed prior to exercises    Myofascial Release  Myofascial release and manual stretching completed to left upper arm, trapezius, and scapularis region to decrease fascial restrictions and increase joint mobility in a pain free zone.                OT Short Term Goals - 12/02/17 0998      OT SHORT TERM GOAL #1   Title  Patient will be educated and independent with HEP to increase functional use of LUE during daily tasks.     Time  6    Period  Weeks    Status  On-going      OT SHORT TERM GOAL #2   Title  patient will increase P/ROM of LUE to [facility] in order to increase ability to complete dressing tasks.    Time  6    Period  Weeks    Status  On-going      OT SHORT TERM GOAL #3   Title  Patient will increase LUE strength to 3+/5 to increase ability to complete waist level activities.     Time  6    Period  Weeks    Status  On-going      OT SHORT TERM GOAL #4   Title  Patient will decrease pain level in LUE during daily tasks to 3/10 or less.     Time  6    Period  Weeks    Status  On-going      OT SHORT TERM GOAL #5   Title  Patient will decrease fascial restrictions in LUE to min amount in order to increase functional mobility needed for reaching tasks.     Time   6    Period  Weeks    Status  On-going        OT Long Term Goals - 12/03/17 0825      OT LONG TERM GOAL #1   Title  Patient will return to highest level of independence using his LUE for all daily tasks.     Time  12    Period  Weeks    Status  On-going      OT LONG TERM GOAL #2   Title  Patient will increase A/ROM to WNL to increase ability to complete overhead reaching tasks.     Time  12    Period  Weeks    Status  On-going      OT LONG TERM GOAL #3   Title  Patient will increase LUE strength to 4+/5 to increase ability to return to work related tasks.     Time  12    Period  Weeks    Status  On-going      OT LONG TERM GOAL #4   Title  Patient will decrease pain level to 2/10 or less when completing daily tasks.     Time  12    Period  Weeks    Status  On-going      OT LONG TERM GOAL #5   Title  Patient to decrease fascial restrictions in LUE to trace amount in order to increase functional mobility needed to return to reaching tasks.     Time  12    Period  Weeks    Status  On-going            Plan - 12/09/17 1729    Clinical Impression Statement  A: Continued to follow protocol. Added isometrics this session and pro/ret/elev/dep. VC for form and technique.     Plan  P: Continue to follow protocol.        Patient will benefit from skilled therapeutic intervention in order to improve the following deficits and impairments:  Decreased strength, Decreased range of motion, Pain, Impaired UE functional use, Increased fascial restrictions  Visit Diagnosis: Other symptoms and signs involving the musculoskeletal system  Stiffness of left shoulder, not elsewhere classified  Acute pain of left shoulder    Problem List There are no active problems to display for this patient.  Ailene Ravel, OTR/L,CBIS  [phone]  12/09/2017, 5:31 PM  [facility] [address] [place], [place], [address] Phone:  [phone]   Fax:  [phone]  Name: Derrick Clark MRN: [id number] Date of Birth: [birth year]

## 2017-12-11 ENCOUNTER — Encounter (HOSPITAL_COMMUNITY): Payer: Self-pay

## 2017-12-11 ENCOUNTER — Ambulatory Visit (HOSPITAL_COMMUNITY): Payer: BLUE CROSS/BLUE SHIELD

## 2017-12-11 DIAGNOSIS — M25612 Stiffness of left shoulder, not elsewhere classified: Secondary | ICD-10-CM | POA: Diagnosis not present

## 2017-12-11 DIAGNOSIS — M25512 Pain in left shoulder: Secondary | ICD-10-CM | POA: Diagnosis not present

## 2017-12-11 DIAGNOSIS — R29898 Other symptoms and signs involving the musculoskeletal system: Secondary | ICD-10-CM | POA: Diagnosis not present

## 2017-12-11 NOTE — Therapy (Signed)
[place], [place], [address] Phone: [phone]   Fax:  [phone]  Occupational Therapy Treatment  Patient Details  Name: Derrick Clark MRN: [id number] Date of Birth: 06/10/1961 Referring Provider (Historical): Remo Lipps Case, MD   Encounter Date: 12/11/2017  OT End of Session - 12/11/17 2239    Visit Number  4    Number of Visits  24    Date for OT Re-Evaluation  01/30/18 mini reassess: 12/30/17    Authorization Type  [employer] other     Authorization Time Period  30 visits combined OT/chiropractor 0 used.    Authorization - Visit Number  3    Authorization - Number of Visits  30    OT Start Time  3295    OT Stop Time  1115    OT Time Calculation (min)  40 min    Activity Tolerance  Patient tolerated treatment well    Behavior During Therapy  WFL for tasks assessed/performed       Past Medical History:  Diagnosis Date  . Diabetes mellitus without complication ([place])     History reviewed. No pertinent surgical history.  There were no vitals filed for this visit.  Subjective Assessment - 12/11/17 1054    Subjective   S: Nothing new to report.    Currently in Pain?  Yes    Pain Score  6     Pain Location  Shoulder    Pain Orientation  Left                   OT Treatments/Exercises (OP) - 12/11/17 1055      Exercises   Exercises  Shoulder      Shoulder Exercises: Supine   Protraction  PROM;10 reps    External Rotation  PROM;10 reps    Internal Rotation  PROM;10 reps    Flexion  PROM;10 reps    ABduction  PROM;10 reps      Shoulder Exercises: Seated   Extension  AROM;12 reps    Row  AROM;12 reps    Other Seated Exercises  Shoulder depression; 12X      Shoulder Exercises: Therapy Ball   Flexion  15 reps    ABduction  15 reps protocol      Shoulder Exercises: ROM/Strengthening   Pendulum  forward/back and side to side using theraband for visual cue. 1 minute each.    Anterior Glide   5x10"    Caudal Glide  5x10"      Shoulder Exercises: Isometric Strengthening   Flexion  -- Hold isometrics until week 8.      Manual Therapy   Manual Therapy  Myofascial release    Manual therapy comments  Manual therapy completed prior to exercises    Myofascial Release  Myofascial release and manual stretching completed to left upper arm, trapezius, and scapularis region to decrease fascial restrictions and increase joint mobility in a pain free zone.                OT Short Term Goals - 12/02/17 1884      OT SHORT TERM GOAL #1   Title  Patient will be educated and independent with HEP to increase functional use of LUE during daily tasks.     Time  6    Period  Weeks    Status  On-going      OT SHORT TERM GOAL #2   Title  patient will increase P/ROM  of LUE to [facility] in order to increase ability to complete dressing tasks.    Time  6    Period  Weeks    Status  On-going      OT SHORT TERM GOAL #3   Title  Patient will increase LUE strength to 3+/5 to increase ability to complete waist level activities.     Time  6    Period  Weeks    Status  On-going      OT SHORT TERM GOAL #4   Title  Patient will decrease pain level in LUE during daily tasks to 3/10 or less.     Time  6    Period  Weeks    Status  On-going      OT SHORT TERM GOAL #5   Title  Patient will decrease fascial restrictions in LUE to min amount in order to increase functional mobility needed for reaching tasks.     Time  6    Period  Weeks    Status  On-going        OT Long Term Goals - 12/03/17 0825      OT LONG TERM GOAL #1   Title  Patient will return to highest level of independence using his LUE for all daily tasks.     Time  12    Period  Weeks    Status  On-going      OT LONG TERM GOAL #2   Title  Patient will increase A/ROM to WNL to increase ability to complete overhead reaching tasks.     Time  12    Period  Weeks    Status  On-going      OT LONG TERM GOAL #3   Title  Patient  will increase LUE strength to 4+/5 to increase ability to return to work related tasks.     Time  12    Period  Weeks    Status  On-going      OT LONG TERM GOAL #4   Title  Patient will decrease pain level to 2/10 or less when completing daily tasks.     Time  12    Period  Weeks    Status  On-going      OT LONG TERM GOAL #5   Title  Patient to decrease fascial restrictions in LUE to trace amount in order to increase functional mobility needed to return to reaching tasks.     Time  12    Period  Weeks    Status  On-going            Plan - 12/11/17 2240    Clinical Impression Statement  A: Continued to follow protocol. Pt required VC for form and technique especially for pendulum exercises.     Plan  P: Continue to follow protocol.        Patient will benefit from skilled therapeutic intervention in order to improve the following deficits and impairments:     Visit Diagnosis: Other symptoms and signs involving the musculoskeletal system  Stiffness of left shoulder, not elsewhere classified  Acute pain of left shoulder    Problem List There are no active problems to display for this patient.  Ailene Ravel, OTR/L,CBIS  [phone]  12/11/2017, 10:49 PM  [facility] [address] [place], [place], [address] Phone: [phone]   Fax:  [phone]  Name: Derrick Clark MRN: [id number] Date of Birth: [birth year]

## 2017-12-14 ENCOUNTER — Telehealth (HOSPITAL_COMMUNITY): Payer: Self-pay

## 2017-12-14 ENCOUNTER — Ambulatory Visit (HOSPITAL_COMMUNITY): Payer: BLUE CROSS/BLUE SHIELD

## 2017-12-14 NOTE — Telephone Encounter (Signed)
Patient is out of town and will not be back in time for his appt.

## 2017-12-16 ENCOUNTER — Encounter (HOSPITAL_COMMUNITY): Payer: Self-pay

## 2017-12-16 ENCOUNTER — Ambulatory Visit (HOSPITAL_COMMUNITY): Payer: BLUE CROSS/BLUE SHIELD

## 2017-12-16 DIAGNOSIS — R29898 Other symptoms and signs involving the musculoskeletal system: Secondary | ICD-10-CM | POA: Diagnosis not present

## 2017-12-16 DIAGNOSIS — M25512 Pain in left shoulder: Secondary | ICD-10-CM | POA: Diagnosis not present

## 2017-12-16 DIAGNOSIS — M25612 Stiffness of left shoulder, not elsewhere classified: Secondary | ICD-10-CM

## 2017-12-16 NOTE — Therapy (Signed)
[place], [place], [address] Phone: [phone]   Fax:  [phone]  Occupational Therapy Treatment  Patient Details  Name: Derrick Clark MRN: [id number] Date of Birth: 02/18/1961 Referring Provider (Historical): Remo Lipps Case, MD   Encounter Date: 12/16/2017  OT End of Session - 12/16/17 1736    Visit Number  5    Number of Visits  24    Date for OT Re-Evaluation  01/30/18 mini reassess: 12/30/17    Authorization Type  [employer] other     Authorization Time Period  30 visits combined OT/chiropractor 0 used.    Authorization - Visit Number  4    Authorization - Number of Visits  30    OT Start Time  1975    OT Stop Time  1730    OT Time Calculation (min)  40 min    Activity Tolerance  Patient tolerated treatment well    Behavior During Therapy  WFL for tasks assessed/performed       Past Medical History:  Diagnosis Date  . Diabetes mellitus without complication ([place])     History reviewed. No pertinent surgical history.  There were no vitals filed for this visit.  Subjective Assessment - 12/16/17 2244    Subjective   S: I have a little soreness.    Currently in Pain?  Yes    Pain Score  3     Pain Location  Shoulder    Pain Orientation  Left    Pain Descriptors / Indicators  Dull    Pain Type  Surgical pain    Pain Radiating Towards  down to elbow    Pain Frequency  Constant    Aggravating Factors   quick movements    Pain Relieving Factors  rest, sleep, ice pack, pain medication    Effect of Pain on Daily Activities  unable to use LUE for daily tasks at this time.    Multiple Pain Sites  No         [facility] OT Assessment - 12/16/17 2245      Assessment   Medical Diagnosis  Left RCR    Referring Provider  Remo Lipps Case, MD    Onset Date/Surgical Date  11/13/17      Precautions   Precautions  Shoulder    Type of Shoulder Precautions  See protocol from Dr. Remo Lipps Case    Shoulder Interventions   Shoulder sling/immobilizer               OT Treatments/Exercises (OP) - 12/16/17 2246      Exercises   Exercises  Shoulder      Shoulder Exercises: Supine   Protraction  PROM;10 reps    External Rotation  PROM;10 reps    Internal Rotation  PROM;10 reps    Flexion  PROM;10 reps    ABduction  PROM;10 reps      Shoulder Exercises: Seated   Extension  AROM;15 reps    Row  AROM;15 reps    Other Seated Exercises  Shoulder depression; 15X      Shoulder Exercises: Therapy Ball   Flexion  20 reps    ABduction  25 reps protocol      Shoulder Exercises: ROM/Strengthening   Thumb Tacks  1'    Anterior Glide  5x10"    Caudal Glide  5x10"    Prot/Ret//Elev/Dep  1'      Manual Therapy   Manual Therapy  Myofascial release  Manual therapy comments  Manual therapy completed prior to exercises    Myofascial Release  Myofascial release and manual stretching completed to left upper arm, trapezius, and scapularis region to decrease fascial restrictions and increase joint mobility in a pain free zone.                OT Short Term Goals - 12/02/17 2025      OT SHORT TERM GOAL #1   Title  Patient will be educated and independent with HEP to increase functional use of LUE during daily tasks.     Time  6    Period  Weeks    Status  On-going      OT SHORT TERM GOAL #2   Title  patient will increase P/ROM of LUE to [facility] in order to increase ability to complete dressing tasks.    Time  6    Period  Weeks    Status  On-going      OT SHORT TERM GOAL #3   Title  Patient will increase LUE strength to 3+/5 to increase ability to complete waist level activities.     Time  6    Period  Weeks    Status  On-going      OT SHORT TERM GOAL #4   Title  Patient will decrease pain level in LUE during daily tasks to 3/10 or less.     Time  6    Period  Weeks    Status  On-going      OT SHORT TERM GOAL #5   Title  Patient will decrease fascial restrictions in LUE to min amount in  order to increase functional mobility needed for reaching tasks.     Time  6    Period  Weeks    Status  On-going        OT Long Term Goals - 12/03/17 0825      OT LONG TERM GOAL #1   Title  Patient will return to highest level of independence using his LUE for all daily tasks.     Time  12    Period  Weeks    Status  On-going      OT LONG TERM GOAL #2   Title  Patient will increase A/ROM to WNL to increase ability to complete overhead reaching tasks.     Time  12    Period  Weeks    Status  On-going      OT LONG TERM GOAL #3   Title  Patient will increase LUE strength to 4+/5 to increase ability to return to work related tasks.     Time  12    Period  Weeks    Status  On-going      OT LONG TERM GOAL #4   Title  Patient will decrease pain level to 2/10 or less when completing daily tasks.     Time  12    Period  Weeks    Status  On-going      OT LONG TERM GOAL #5   Title  Patient to decrease fascial restrictions in LUE to trace amount in order to increase functional mobility needed to return to reaching tasks.     Time  12    Period  Weeks    Status  On-going            Plan - 12/16/17 2248    Clinical Impression Statement  A: Increased repetitions for seated A/ROM  scapular exercises and therapy ball stretches. VC for form and technique. Patient did have a muscle knot in left anterior deltoid region.     Plan  P: By 12/28, patient should be ready to progress to next step of protocol.    Consulted and Agree with Plan of Care  Patient       Patient will benefit from skilled therapeutic intervention in order to improve the following deficits and impairments:  Decreased strength, Decreased range of motion, Pain, Impaired UE functional use, Increased fascial restrictions  Visit Diagnosis: Other symptoms and signs involving the musculoskeletal system  Stiffness of left shoulder, not elsewhere classified  Acute pain of left shoulder    Problem List There are  no active problems to display for this patient.  Ailene Ravel, OTR/L,CBIS  [phone]  12/16/2017, 10:50 PM  [facility] [address] [place], [place], [address] Phone: [phone]   Fax:  [phone]  Name: Derrick Clark MRN: [id number] Date of Birth: 25-Jun-1961

## 2017-12-18 DIAGNOSIS — E1142 Type 2 diabetes mellitus with diabetic polyneuropathy: Secondary | ICD-10-CM | POA: Diagnosis not present

## 2017-12-18 DIAGNOSIS — E782 Mixed hyperlipidemia: Secondary | ICD-10-CM | POA: Diagnosis not present

## 2017-12-18 DIAGNOSIS — E1165 Type 2 diabetes mellitus with hyperglycemia: Secondary | ICD-10-CM | POA: Diagnosis not present

## 2017-12-18 DIAGNOSIS — E8881 Metabolic syndrome: Secondary | ICD-10-CM | POA: Diagnosis not present

## 2017-12-24 DIAGNOSIS — E1165 Type 2 diabetes mellitus with hyperglycemia: Secondary | ICD-10-CM | POA: Diagnosis not present

## 2017-12-24 DIAGNOSIS — M545 Low back pain: Secondary | ICD-10-CM | POA: Diagnosis not present

## 2017-12-24 DIAGNOSIS — E8881 Metabolic syndrome: Secondary | ICD-10-CM | POA: Diagnosis not present

## 2017-12-24 DIAGNOSIS — F17210 Nicotine dependence, cigarettes, uncomplicated: Secondary | ICD-10-CM | POA: Diagnosis not present

## 2017-12-24 DIAGNOSIS — E782 Mixed hyperlipidemia: Secondary | ICD-10-CM | POA: Diagnosis not present

## 2017-12-24 DIAGNOSIS — Z1212 Encounter for screening for malignant neoplasm of rectum: Secondary | ICD-10-CM | POA: Diagnosis not present

## 2017-12-25 ENCOUNTER — Encounter (HOSPITAL_COMMUNITY): Payer: Self-pay | Admitting: Occupational Therapy

## 2017-12-25 ENCOUNTER — Other Ambulatory Visit: Payer: Self-pay

## 2017-12-25 ENCOUNTER — Ambulatory Visit (HOSPITAL_COMMUNITY): Payer: BLUE CROSS/BLUE SHIELD | Admitting: Occupational Therapy

## 2017-12-25 DIAGNOSIS — M25612 Stiffness of left shoulder, not elsewhere classified: Secondary | ICD-10-CM

## 2017-12-25 DIAGNOSIS — R29898 Other symptoms and signs involving the musculoskeletal system: Secondary | ICD-10-CM | POA: Diagnosis not present

## 2017-12-25 DIAGNOSIS — M25512 Pain in left shoulder: Secondary | ICD-10-CM | POA: Diagnosis not present

## 2017-12-25 NOTE — Therapy (Signed)
[place], [place], [address] Phone: [phone]   Fax:  [phone]  Occupational Therapy Treatment  Patient Details  Name: Derrick Clark MRN: [id number] Date of Birth: [birth year] Referring Provider (Historical): Remo Lipps Case, MD   Encounter Date: 12/25/2017  OT End of Session - 12/25/17 1558    Visit Number  6    Number of Visits  24    Date for OT Re-Evaluation  01/30/18 mini reassess: 12/30/17    Authorization Type  [employer] other     Authorization Time Period  30 visits combined OT/chiropractor 0 used.    Authorization - Visit Number  5    Authorization - Number of Visits  30    OT Start Time  9390    OT Stop Time  1600    OT Time Calculation (min)  44 min    Activity Tolerance  Patient tolerated treatment well    Behavior During Therapy  WFL for tasks assessed/performed       Past Medical History:  Diagnosis Date  . Diabetes mellitus without complication ([place])     History reviewed. No pertinent surgical history.  There were no vitals filed for this visit.  Subjective Assessment - 12/25/17 1514    Subjective   S: It's been really sore the last two days.     Currently in Pain?  Yes    Pain Score  9     Pain Location  Shoulder    Pain Orientation  Left    Pain Descriptors / Indicators  Aching;Sore    Pain Type  Acute pain    Pain Radiating Towards  down to elbow    Pain Onset  More than a month ago    Pain Frequency  Constant    Aggravating Factors   certain movements    Pain Relieving Factors  rest, sleep, pain medication, heat    Effect of Pain on Daily Activities  unable to use LUE for daily tasks at this time.          [facility] OT Assessment - 12/25/17 1514      Assessment   Medical Diagnosis  Left RCR      Precautions   Precautions  Shoulder    Type of Shoulder Precautions  See protocol from Dr. Remo Lipps Case    Shoulder Interventions  Shoulder sling/immobilizer               OT  Treatments/Exercises (OP) - 12/25/17 1521      Exercises   Exercises  Shoulder      Shoulder Exercises: Supine   Protraction  PROM;AAROM;10 reps    Horizontal ABduction  PROM;AAROM;10 reps    External Rotation  PROM;AAROM;10 reps    Internal Rotation  PROM;AAROM;10 reps    Flexion  PROM;AAROM;10 reps    ABduction  PROM;AAROM;10 reps      Shoulder Exercises: Seated   Extension  AROM;15 reps    Row  AROM;15 reps    Protraction  AAROM;10 reps    External Rotation  AAROM;10 reps    Internal Rotation  AAROM;10 reps    Other Seated Exercises  Shoulder depression; 15X      Shoulder Exercises: Therapy Ball   Flexion  20 reps    ABduction  20 reps protocol      Shoulder Exercises: ROM/Strengthening   Wall Wash  1'    Thumb Tacks  1'    Prot/Ret//Elev/Dep  1'  Manual Therapy   Manual Therapy  Myofascial release    Manual therapy comments  Manual therapy completed prior to exercises    Myofascial Release  Myofascial release and manual stretching completed to left upper arm, trapezius, and scapularis region to decrease fascial restrictions and increase joint mobility in a pain free zone.                OT Short Term Goals - 12/02/17 8811      OT SHORT TERM GOAL #1   Title  Patient will be educated and independent with HEP to increase functional use of LUE during daily tasks.     Time  6    Period  Weeks    Status  On-going      OT SHORT TERM GOAL #2   Title  patient will increase P/ROM of LUE to [facility] in order to increase ability to complete dressing tasks.    Time  6    Period  Weeks    Status  On-going      OT SHORT TERM GOAL #3   Title  Patient will increase LUE strength to 3+/5 to increase ability to complete waist level activities.     Time  6    Period  Weeks    Status  On-going      OT SHORT TERM GOAL #4   Title  Patient will decrease pain level in LUE during daily tasks to 3/10 or less.     Time  6    Period  Weeks    Status  On-going      OT SHORT  TERM GOAL #5   Title  Patient will decrease fascial restrictions in LUE to min amount in order to increase functional mobility needed for reaching tasks.     Time  6    Period  Weeks    Status  On-going        OT Long Term Goals - 12/03/17 0825      OT LONG TERM GOAL #1   Title  Patient will return to highest level of independence using his LUE for all daily tasks.     Time  12    Period  Weeks    Status  On-going      OT LONG TERM GOAL #2   Title  Patient will increase A/ROM to WNL to increase ability to complete overhead reaching tasks.     Time  12    Period  Weeks    Status  On-going      OT LONG TERM GOAL #3   Title  Patient will increase LUE strength to 4+/5 to increase ability to return to work related tasks.     Time  12    Period  Weeks    Status  On-going      OT LONG TERM GOAL #4   Title  Patient will decrease pain level to 2/10 or less when completing daily tasks.     Time  12    Period  Weeks    Status  On-going      OT LONG TERM GOAL #5   Title  Patient to decrease fascial restrictions in LUE to trace amount in order to increase functional mobility needed to return to reaching tasks.     Time  12    Period  Weeks    Status  On-going            Plan - 12/25/17 1539  Clinical Impression Statement  A: Progressed to phase II of protocol adding AA/ROM in supine and some exercises in sitting, and added wall wash. Pt required initial verbal cuing for form and technique. Continued with therapy ball stretches and AA/ROM exercises at doorway. No increased pain reported, moderate muscle fatigue.     Plan  P: Continue with phase II adding remaining seated AA/ROM when able to tolerate. Add AA/ROM to HEP    Consulted and Agree with Plan of Care  Patient       Patient will benefit from skilled therapeutic intervention in order to improve the following deficits and impairments:  Decreased strength, Decreased range of motion, Pain, Impaired UE functional use,  Increased fascial restrictions  Visit Diagnosis: Other symptoms and signs involving the musculoskeletal system  Stiffness of left shoulder, not elsewhere classified  Acute pain of left shoulder    Problem List There are no active problems to display for this patient.  Guadelupe Sabin, OTR/L  [phone] 12/25/2017, 4:01 PM  [facility] [address] [place], [place], [address] Phone: [phone]   Fax:  [phone]  Name: Derrick Clark MRN: [id number] Date of Birth: 11/17/1961

## 2017-12-29 HISTORY — PX: COLONOSCOPY: SHX174

## 2017-12-30 ENCOUNTER — Ambulatory Visit (HOSPITAL_COMMUNITY): Payer: BLUE CROSS/BLUE SHIELD | Attending: Orthopedic Surgery

## 2017-12-30 ENCOUNTER — Encounter (HOSPITAL_COMMUNITY): Payer: Self-pay

## 2017-12-30 DIAGNOSIS — R29898 Other symptoms and signs involving the musculoskeletal system: Secondary | ICD-10-CM | POA: Insufficient documentation

## 2017-12-30 DIAGNOSIS — M25612 Stiffness of left shoulder, not elsewhere classified: Secondary | ICD-10-CM

## 2017-12-30 DIAGNOSIS — M25512 Pain in left shoulder: Secondary | ICD-10-CM

## 2017-12-30 NOTE — Patient Instructions (Signed)

## 2017-12-30 NOTE — Therapy (Signed)
[facility] [place], [place], [address] Phone: [phone]   Fax:  [phone]  Occupational Therapy Treatment  Patient Details  Name: Derrick Clark MRN: [id number] Date of Birth: 08-19-1961 Referring Provider (Historical): Remo Lipps Case, MD   Encounter Date: 12/30/2017  OT End of Session - 12/30/17 1711    Visit Number  7    Number of Visits  24    Date for OT Re-Evaluation  01/30/18 mini reassess: 12/30/17    Authorization Type  [employer] other     Authorization Time Period  30 visits combined OT/chiropractor 0 used. Benefit period: calendar year    Authorization - Visit Number  1    Authorization - Number of Visits  30    OT Start Time  8250 mini reasssess    OT Stop Time  1730    OT Time Calculation (min)  40 min    Activity Tolerance  Patient tolerated treatment well    Behavior During Therapy  WFL for tasks assessed/performed       Past Medical History:  Diagnosis Date  . Diabetes mellitus without complication ([place])     History reviewed. No pertinent surgical history.  There were no vitals filed for this visit.  Subjective Assessment - 12/30/17 1651    Subjective   S: It's not as sore as it was before.    Currently in Pain?  Yes    Pain Score  5     Pain Location  Shoulder    Pain Orientation  Left    Pain Descriptors / Indicators  Aching;Sore    Pain Type  Acute pain         [facility] OT Assessment - 12/30/17 1652      Assessment   Medical Diagnosis  Left RCR    Onset Date/Surgical Date  11/13/17      Precautions   Precautions  Shoulder    Type of Shoulder Precautions  See protocol from Dr. Remo Lipps Case    Shoulder Interventions  Shoulder sling/immobilizer      Prior Function   Level of Independence  Independent      ROM / Strength   AROM / PROM / Strength  AROM;PROM;Strength      AROM   Overall AROM   Unable to assess;Due to precautions      PROM   Overall PROM Comments  Assessed supine. IR/er  adducted.    PROM Assessment Site  Shoulder    Right/Left Shoulder  Left    Left Shoulder Flexion  150 Degrees previous: 114    Left Shoulder ABduction  160 Degrees previous: 60    Left Shoulder Internal Rotation  90 Degrees previous: same    Left Shoulder External Rotation  50 Degrees previous: 40      Strength   Overall Strength  Unable to assess;Due to precautions               OT Treatments/Exercises (OP) - 12/30/17 1708      Exercises   Exercises  Shoulder      Shoulder Exercises: Supine   Protraction  PROM;10 reps;AAROM;12 reps    Horizontal ABduction  PROM;10 reps;AAROM;12 reps    External Rotation  PROM;10 reps;AAROM;12 reps    Internal Rotation  PROM;10 reps;AAROM;12 reps    Flexion  PROM;10 reps;AAROM;12 reps    ABduction  PROM;10 reps;AAROM;12 reps      Shoulder Exercises: Seated   Protraction  AAROM;10 reps  External Rotation  AAROM;10 reps    Internal Rotation  AAROM;10 reps    Flexion  AAROM;10 reps    Abduction  AAROM;10 reps      Shoulder Exercises: Pulleys   Flexion  1 minute    ABduction  1 minute      Manual Therapy   Manual Therapy  Myofascial release    Manual therapy comments  Manual therapy completed prior to exercises             OT Education - 12/30/17 1710    Education provided  Yes    Education Details  AA/ROM shoulder exercises    Person(s) Educated  Patient    Methods  Explanation;Handout;Verbal cues;Demonstration    Comprehension  Verbalized understanding       OT Short Term Goals - 12/30/17 1712      OT SHORT TERM GOAL #1   Title  Patient will be educated and independent with HEP to increase functional use of LUE during daily tasks.     Time  6    Period  Weeks      OT SHORT TERM GOAL #2   Title  patient will increase P/ROM of LUE to [facility] in order to increase ability to complete dressing tasks.    Time  6    Period  Weeks    Status  Achieved      OT SHORT TERM GOAL #3   Title  Patient will increase LUE  strength to 3+/5 to increase ability to complete waist level activities.     Time  6    Period  Weeks    Status  On-going      OT SHORT TERM GOAL #4   Title  Patient will decrease pain level in LUE during daily tasks to 3/10 or less.     Time  6    Period  Weeks    Status  On-going      OT SHORT TERM GOAL #5   Title  Patient will decrease fascial restrictions in LUE to min amount in order to increase functional mobility needed for reaching tasks.     Time  6    Period  Weeks    Status  Achieved        OT Long Term Goals - 12/03/17 0825      OT LONG TERM GOAL #1   Title  Patient will return to highest level of independence using his LUE for all daily tasks.     Time  12    Period  Weeks    Status  On-going      OT LONG TERM GOAL #2   Title  Patient will increase A/ROM to WNL to increase ability to complete overhead reaching tasks.     Time  12    Period  Weeks    Status  On-going      OT LONG TERM GOAL #3   Title  Patient will increase LUE strength to 4+/5 to increase ability to return to work related tasks.     Time  12    Period  Weeks    Status  On-going      OT LONG TERM GOAL #4   Title  Patient will decrease pain level to 2/10 or less when completing daily tasks.     Time  12    Period  Weeks    Status  On-going      OT LONG TERM GOAL #5  Title  Patient to decrease fascial restrictions in LUE to trace amount in order to increase functional mobility needed to return to reaching tasks.     Time  12    Period  Weeks    Status  On-going            Plan - 12/30/17 1713    Clinical Impression Statement  A: Mini reassessment completed this date. 2/5 short term goals met at this time. Patient is now out of the sling and is progressing to AA/ROM exercises. HEP was updated.     Plan  P: Continue with AA/ROM and increase repetitions as able. Follow up on HEP.    Consulted and Agree with Plan of Care  Patient       Patient will benefit from skilled  therapeutic intervention in order to improve the following deficits and impairments:  Decreased strength, Decreased range of motion, Pain, Impaired UE functional use, Increased fascial restrictions  Visit Diagnosis: Other symptoms and signs involving the musculoskeletal system  Stiffness of left shoulder, not elsewhere classified  Acute pain of left shoulder    Problem List There are no active problems to display for this patient.  Ailene Ravel, OTR/L,CBIS  [phone]  12/30/2017, 5:39 PM  [facility] [address] [place], [place], [address] Phone: [phone]   Fax:  [phone]  Name: RENARD CAPERTON MRN: [id number] Date of Birth: 19-Jun-1961

## 2018-01-01 ENCOUNTER — Ambulatory Visit (HOSPITAL_COMMUNITY): Payer: BLUE CROSS/BLUE SHIELD

## 2018-01-04 ENCOUNTER — Ambulatory Visit (HOSPITAL_COMMUNITY): Payer: BLUE CROSS/BLUE SHIELD

## 2018-01-04 ENCOUNTER — Encounter (HOSPITAL_COMMUNITY): Payer: Self-pay

## 2018-01-04 DIAGNOSIS — M25512 Pain in left shoulder: Secondary | ICD-10-CM

## 2018-01-04 DIAGNOSIS — R29898 Other symptoms and signs involving the musculoskeletal system: Secondary | ICD-10-CM | POA: Diagnosis not present

## 2018-01-04 DIAGNOSIS — M25612 Stiffness of left shoulder, not elsewhere classified: Secondary | ICD-10-CM | POA: Diagnosis not present

## 2018-01-04 NOTE — Therapy (Signed)
[place], [place], [address] Phone: [phone]   Fax:  [phone]  Occupational Therapy Treatment  Patient Details  Name: Derrick Clark MRN: [id number] Date of Birth: 12/07/1961 Referring Provider (Historical): Remo Lipps Case, MD   Encounter Date: 01/04/2018  OT End of Session - 01/04/18 1719    Visit Number  8    Number of Visits  24    Date for OT Re-Evaluation  01/30/18    Authorization Type  [employer] other     Authorization Time Period  30 visits combined OT/chiropractor 0 used. Benefit period: calendar year    Authorization - Visit Number  2    Authorization - Number of Visits  30    OT Start Time  2094    OT Stop Time  1730    OT Time Calculation (min)  42 min    Activity Tolerance  Patient tolerated treatment well    Behavior During Therapy  WFL for tasks assessed/performed       Past Medical History:  Diagnosis Date  . Diabetes mellitus without complication ([place])     History reviewed. No pertinent surgical history.  There were no vitals filed for this visit.  Subjective Assessment - 01/04/18 1712    Currently in Pain?  Yes    Pain Score  7     Pain Location  Shoulder    Pain Orientation  Left    Pain Descriptors / Indicators  Sore    Pain Type  Acute pain    Pain Radiating Towards  N/A    Pain Onset  More than a month ago    Pain Frequency  Constant    Aggravating Factors   new exercises    Pain Relieving Factors  rest, sleep, pain medication, Heat    Effect of Pain on Daily Activities  Mod effect    Multiple Pain Sites  No         [facility] OT Assessment - 01/04/18 1713      Assessment   Medical Diagnosis  Left RCR      Precautions   Precautions  Shoulder    Type of Shoulder Precautions  See protocol from Dr. Remo Lipps Case               OT Treatments/Exercises (OP) - 01/04/18 1713      Exercises   Exercises  Shoulder      Shoulder Exercises: Supine   Protraction  PROM;5  reps;AAROM;15 reps;AROM;10 reps    Horizontal ABduction  PROM;5 reps;AROM;10 reps    External Rotation  PROM;5 reps;AROM;10 reps    Internal Rotation  PROM;5 reps;AROM;10 reps    Flexion  PROM;5 reps;AAROM;15 reps;AROM;10 reps    ABduction  PROM;5 reps;AAROM;15 reps      Shoulder Exercises: Seated   Protraction  AAROM;15 reps    Horizontal ABduction  AAROM;15 reps    External Rotation  AAROM;15 reps    Internal Rotation  AAROM;15 reps    Flexion  AAROM;15 reps    Abduction  AAROM;15 reps      Shoulder Exercises: Pulleys   Flexion  1 minute    ABduction  1 minute      Shoulder Exercises: ROM/Strengthening   Wall Wash  1'    Proximal Shoulder Strengthening, Seated  10 times no rest breaks      Manual Therapy   Manual Therapy  Myofascial release    Manual therapy comments  Manual therapy completed  prior to exercises    Myofascial Release  Myofascial release and manual stretching completed to left upper arm, trapezius, and scapularis region to decrease fascial restrictions and increase joint mobility in a pain free zone.                OT Short Term Goals - 01/04/18 1738      OT SHORT TERM GOAL #1   Title  Patient will be educated and independent with HEP to increase functional use of LUE during daily tasks.     Time  6    Period  Weeks      OT SHORT TERM GOAL #2   Title  patient will increase P/ROM of LUE to [facility] in order to increase ability to complete dressing tasks.    Time  6    Period  Weeks      OT SHORT TERM GOAL #3   Title  Patient will increase LUE strength to 3+/5 to increase ability to complete waist level activities.     Time  6    Period  Weeks    Status  On-going      OT SHORT TERM GOAL #4   Title  Patient will decrease pain level in LUE during daily tasks to 3/10 or less.     Time  6    Period  Weeks    Status  On-going      OT SHORT TERM GOAL #5   Title  Patient will decrease fascial restrictions in LUE to min amount in order to increase  functional mobility needed for reaching tasks.     Time  6    Period  Weeks        OT Long Term Goals - 12/03/17 0825      OT LONG TERM GOAL #1   Title  Patient will return to highest level of independence using his LUE for all daily tasks.     Time  12    Period  Weeks    Status  On-going      OT LONG TERM GOAL #2   Title  Patient will increase A/ROM to WNL to increase ability to complete overhead reaching tasks.     Time  12    Period  Weeks    Status  On-going      OT LONG TERM GOAL #3   Title  Patient will increase LUE strength to 4+/5 to increase ability to return to work related tasks.     Time  12    Period  Weeks    Status  On-going      OT LONG TERM GOAL #4   Title  Patient will decrease pain level to 2/10 or less when completing daily tasks.     Time  12    Period  Weeks    Status  On-going      OT LONG TERM GOAL #5   Title  Patient to decrease fascial restrictions in LUE to trace amount in order to increase functional mobility needed to return to reaching tasks.     Time  12    Period  Weeks    Status  On-going            Plan - 01/04/18 1726    Clinical Impression Statement  A: Increased to A/ROM supine for a portion of shoulder exercises. Increased AA/ROM seated repetitions. Pt reports a catching sensation in left shoulder. Sensation was felt during passive stretching with abduction.  Plan  P: Continue with A/ROM supine as able. Continue with AA/ROM seated. Add therapy ball circles.    Consulted and Agree with Plan of Care  Patient       Patient will benefit from skilled therapeutic intervention in order to improve the following deficits and impairments:  Decreased strength, Decreased range of motion, Pain, Impaired UE functional use, Increased fascial restrictions  Visit Diagnosis: Other symptoms and signs involving the musculoskeletal system  Stiffness of left shoulder, not elsewhere classified  Acute pain of left shoulder    Problem  List There are no active problems to display for this patient.  Ailene Ravel, OTR/L,CBIS  [phone]  01/04/2018, 5:40 PM  [facility] [address] [place], [place], [address] Phone: [phone]   Fax:  [phone]  Name: Derrick Clark MRN: [id number] Date of Birth: [birth year]

## 2018-01-06 ENCOUNTER — Encounter (HOSPITAL_COMMUNITY): Payer: Self-pay

## 2018-01-06 ENCOUNTER — Ambulatory Visit (HOSPITAL_COMMUNITY): Payer: BLUE CROSS/BLUE SHIELD

## 2018-01-06 DIAGNOSIS — M25512 Pain in left shoulder: Secondary | ICD-10-CM

## 2018-01-06 DIAGNOSIS — R29898 Other symptoms and signs involving the musculoskeletal system: Secondary | ICD-10-CM | POA: Diagnosis not present

## 2018-01-06 DIAGNOSIS — M25612 Stiffness of left shoulder, not elsewhere classified: Secondary | ICD-10-CM

## 2018-01-07 NOTE — Therapy (Signed)
[place], [place], [address] Phone: [phone]   Fax:  [phone]  Occupational Therapy Treatment  Patient Details  Name: Derrick Clark MRN: [id number] Date of Birth: Sep 04, 1961 Referring Provider: Remo Lipps Case, MD   Encounter Date: 01/06/2018  OT End of Session - 01/06/18 1712    Visit Number  9    Number of Visits  24    Date for OT Re-Evaluation  01/30/18    Authorization Type  [employer] other     Authorization Time Period  30 visits combined OT/chiropractor 0 used. Benefit period: calendar year    Authorization - Visit Number  3    Authorization - Number of Visits  30    OT Start Time  2355    OT Stop Time  1730    OT Time Calculation (min)  40 min    Activity Tolerance  Patient tolerated treatment well    Behavior During Therapy  WFL for tasks assessed/performed       Past Medical History:  Diagnosis Date  . Diabetes mellitus without complication ([place])     History reviewed. No pertinent surgical history.  There were no vitals filed for this visit.  Subjective Assessment - 01/06/18 1710    Subjective   S: It's sore from last time.     Currently in Pain?  Yes    Pain Score  7     Pain Location  Shoulder    Pain Orientation  Left    Pain Descriptors / Indicators  Sore    Pain Type  Acute pain                   OT Treatments/Exercises (OP) - 01/06/18 1710      Exercises   Exercises  Shoulder      Shoulder Exercises: Supine   Protraction  PROM;5 reps;AAROM;15 reps;AROM;10 reps    Horizontal ABduction  PROM;5 reps;AROM;10 reps    External Rotation  PROM;5 reps;AROM;10 reps    Internal Rotation  PROM;5 reps;AROM;10 reps    Flexion  PROM;5 reps;AAROM;15 reps;AROM;10 reps    ABduction  PROM;5 reps;AAROM;15 reps      Shoulder Exercises: Seated   Protraction  AAROM;15 reps    Horizontal ABduction  AAROM;15 reps    External Rotation  AAROM;15 reps    Internal Rotation  AAROM;15 reps     Flexion  AAROM;15 reps    Abduction  AAROM;15 reps      Shoulder Exercises: Pulleys   Flexion  1 minute    ABduction  1 minute      Shoulder Exercises: Therapy Ball   Right/Left  5 reps      Shoulder Exercises: ROM/Strengthening   Wall Wash  1'    Over Head Lace  2'    Proximal Shoulder Strengthening, Seated  10 times no rest breaks      Manual Therapy   Manual Therapy  Myofascial release    Manual therapy comments  Manual therapy completed prior to exercises    Myofascial Release  Myofascial release and manual stretching completed to left upper arm, trapezius, and scapularis region to decrease fascial restrictions and increase joint mobility in a pain free zone.                OT Short Term Goals - 01/04/18 1738      OT SHORT TERM GOAL #1   Title  Patient will be educated and independent with HEP  to increase functional use of LUE during daily tasks.     Time  6    Period  Weeks      OT SHORT TERM GOAL #2   Title  patient will increase P/ROM of LUE to [facility] in order to increase ability to complete dressing tasks.    Time  6    Period  Weeks      OT SHORT TERM GOAL #3   Title  Patient will increase LUE strength to 3+/5 to increase ability to complete waist level activities.     Time  6    Period  Weeks    Status  On-going      OT SHORT TERM GOAL #4   Title  Patient will decrease pain level in LUE during daily tasks to 3/10 or less.     Time  6    Period  Weeks    Status  On-going      OT SHORT TERM GOAL #5   Title  Patient will decrease fascial restrictions in LUE to min amount in order to increase functional mobility needed for reaching tasks.     Time  6    Period  Weeks        OT Long Term Goals - 12/03/17 0825      OT LONG TERM GOAL #1   Title  Patient will return to highest level of independence using his LUE for all daily tasks.     Time  12    Period  Weeks    Status  On-going      OT LONG TERM GOAL #2   Title  Patient will increase A/ROM  to WNL to increase ability to complete overhead reaching tasks.     Time  12    Period  Weeks    Status  On-going      OT LONG TERM GOAL #3   Title  Patient will increase LUE strength to 4+/5 to increase ability to return to work related tasks.     Time  12    Period  Weeks    Status  On-going      OT LONG TERM GOAL #4   Title  Patient will decrease pain level to 2/10 or less when completing daily tasks.     Time  12    Period  Weeks    Status  On-going      OT LONG TERM GOAL #5   Title  Patient to decrease fascial restrictions in LUE to trace amount in order to increase functional mobility needed to return to reaching tasks.     Time  12    Period  Weeks    Status  On-going            Plan - 01/06/18 1713    Clinical Impression Statement  A: Continued with A/ROM supine and AA/ROM seated. Pt reports some soreness and fatigue during exercises. VC for form and technique.    Plan  P: Progress to A/ROM when able to tolerate.     Consulted and Agree with Plan of Care  Patient       Patient will benefit from skilled therapeutic intervention in order to improve the following deficits and impairments:  Decreased strength, Decreased range of motion, Pain, Impaired UE functional use, Increased fascial restrictions  Visit Diagnosis: Other symptoms and signs involving the musculoskeletal system  Stiffness of left shoulder, not elsewhere classified  Acute pain of left shoulder  Problem List There are no active problems to display for this patient.    Ailene Ravel, OTR/L,CBIS  [phone]  01/07/2018, 9:22 AM  [facility] [address] [place], [place], [address] Phone: [phone]   Fax:  [phone]  Name: MIKO SIRICO MRN: [id number] Date of Birth: 1961/09/17

## 2018-01-11 ENCOUNTER — Ambulatory Visit (HOSPITAL_COMMUNITY): Payer: BLUE CROSS/BLUE SHIELD | Admitting: Specialist

## 2018-01-11 DIAGNOSIS — M25612 Stiffness of left shoulder, not elsewhere classified: Secondary | ICD-10-CM

## 2018-01-11 DIAGNOSIS — M25512 Pain in left shoulder: Secondary | ICD-10-CM

## 2018-01-11 DIAGNOSIS — R29898 Other symptoms and signs involving the musculoskeletal system: Secondary | ICD-10-CM | POA: Diagnosis not present

## 2018-01-12 ENCOUNTER — Encounter (HOSPITAL_COMMUNITY): Payer: Self-pay | Admitting: Specialist

## 2018-01-12 NOTE — Therapy (Signed)
[place], [place], [address] Phone: [phone]   Fax:  [phone]  Occupational Therapy Treatment  Patient Details  Name: Derrick Clark MRN: [id number] Date of Birth: [birth year] Referring Provider: Remo Lipps Case, MD   Encounter Date: 01/11/2018  OT End of Session - 01/12/18 0856    Visit Number  10    Number of Visits  24    Date for OT Re-Evaluation  01/30/18    Authorization Type  [employer] other     Authorization Time Period  30 visits combined OT/chiropractor 0 used. Benefit period: calendar year    Authorization - Visit Number  4    Authorization - Number of Visits  30    OT Start Time  7106    OT Stop Time  1645    OT Time Calculation (min)  40 min    Activity Tolerance  Patient tolerated treatment well    Behavior During Therapy  WFL for tasks assessed/performed       Past Medical History:  Diagnosis Date  . Diabetes mellitus without complication ([place])     History reviewed. No pertinent surgical history.  There were no vitals filed for this visit.  Subjective Assessment - 01/12/18 0855    Subjective   S:  I have had a tight feeling in my shoulder since last week.     Currently in Pain?  Yes    Pain Score  4     Pain Location  Shoulder    Pain Orientation  Left    Pain Descriptors / Indicators  Sore    Pain Type  Acute pain         [facility] OT Assessment - 01/12/18 0001      Assessment   Medical Diagnosis  Left RCR      Precautions   Precautions  Shoulder    Type of Shoulder Precautions  See protocol from Dr. Remo Lipps Case               OT Treatments/Exercises (OP) - 01/12/18 0001      Exercises   Exercises  Shoulder      Manual Therapy   Manual Therapy  Myofascial release    Manual therapy comments  Manual therapy completed prior to exercises    Myofascial Release  Myofascial release and manual stretching completed to left upper arm, trapezius, and scapularis region to decrease  fascial restrictions and increase joint mobility in a pain free zone.                OT Short Term Goals - 01/04/18 1738      OT SHORT TERM GOAL #1   Title  Patient will be educated and independent with HEP to increase functional use of LUE during daily tasks.     Time  6    Period  Weeks      OT SHORT TERM GOAL #2   Title  patient will increase P/ROM of LUE to [facility] in order to increase ability to complete dressing tasks.    Time  6    Period  Weeks      OT SHORT TERM GOAL #3   Title  Patient will increase LUE strength to 3+/5 to increase ability to complete waist level activities.     Time  6    Period  Weeks    Status  On-going      OT SHORT TERM GOAL #4   Title  Patient will decrease pain level in LUE during daily tasks to 3/10 or less.     Time  6    Period  Weeks    Status  On-going      OT SHORT TERM GOAL #5   Title  Patient will decrease fascial restrictions in LUE to min amount in order to increase functional mobility needed for reaching tasks.     Time  6    Period  Weeks        OT Long Term Goals - 12/03/17 0825      OT LONG TERM GOAL #1   Title  Patient will return to highest level of independence using his LUE for all daily tasks.     Time  12    Period  Weeks    Status  On-going      OT LONG TERM GOAL #2   Title  Patient will increase A/ROM to WNL to increase ability to complete overhead reaching tasks.     Time  12    Period  Weeks    Status  On-going      OT LONG TERM GOAL #3   Title  Patient will increase LUE strength to 4+/5 to increase ability to return to work related tasks.     Time  12    Period  Weeks    Status  On-going      OT LONG TERM GOAL #4   Title  Patient will decrease pain level to 2/10 or less when completing daily tasks.     Time  12    Period  Weeks    Status  On-going      OT LONG TERM GOAL #5   Title  Patient to decrease fascial restrictions in LUE to trace amount in order to increase functional mobility needed  to return to reaching tasks.     Time  12    Period  Weeks    Status  On-going            Plan - 01/12/18 3532    Clinical Impression Statement  A:  Patient has some difficulty with A/ROM in seated this date.  Noted popping and grinding sound/sensation with abduction at 90 and above in supine and seated position.    Plan  P:  Attempt A/ROM flexion in seated as proximal shoulder strength improves.         Patient will benefit from skilled therapeutic intervention in order to improve the following deficits and impairments:  Decreased strength, Decreased range of motion, Pain, Impaired UE functional use, Increased fascial restrictions  Visit Diagnosis: Other symptoms and signs involving the musculoskeletal system  Stiffness of left shoulder, not elsewhere classified  Acute pain of left shoulder    Problem List There are no active problems to display for this patient.   Vangie Bicker, [place], OTR/L [phone]  01/12/2018, 9:15 AM  [facility] [place], [place], [address] Phone: [phone]   Fax:  [phone]  Name: Derrick Clark MRN: [id number] Date of Birth: [birth year]

## 2018-01-13 ENCOUNTER — Ambulatory Visit (HOSPITAL_COMMUNITY): Payer: BLUE CROSS/BLUE SHIELD

## 2018-01-13 ENCOUNTER — Encounter (HOSPITAL_COMMUNITY): Payer: Self-pay

## 2018-01-13 DIAGNOSIS — R29898 Other symptoms and signs involving the musculoskeletal system: Secondary | ICD-10-CM | POA: Diagnosis not present

## 2018-01-13 DIAGNOSIS — M25612 Stiffness of left shoulder, not elsewhere classified: Secondary | ICD-10-CM

## 2018-01-13 DIAGNOSIS — M25512 Pain in left shoulder: Secondary | ICD-10-CM | POA: Diagnosis not present

## 2018-01-13 NOTE — Therapy (Signed)
[place], [place], [address] Phone: [phone]   Fax:  [phone]  Occupational Therapy Treatment  Patient Details  Name: Derrick Clark MRN: [id number] Date of Birth: [birth year] Referring Provider: Remo Lipps Case, MD   Encounter Date: 01/13/2018  OT End of Session - 01/13/18 1645    Visit Number  11    Number of Visits  24    Date for OT Re-Evaluation  01/30/18    Authorization Type  [employer] other     Authorization Time Period  30 visits combined OT/chiropractor 0 used. Benefit period: calendar year    Authorization - Visit Number  5    Authorization - Number of Visits  30    OT Start Time  1520    OT Stop Time  1600    OT Time Calculation (min)  40 min    Activity Tolerance  Patient tolerated treatment well    Behavior During Therapy  WFL for tasks assessed/performed       Past Medical History:  Diagnosis Date  . Diabetes mellitus without complication ([place])     History reviewed. No pertinent surgical history.  There were no vitals filed for this visit.  Subjective Assessment - 01/13/18 1545    Subjective   S: It's sore from the new exercises last time.     Currently in Pain?  Yes    Pain Score  5     Pain Location  Shoulder    Pain Descriptors / Indicators  Sore    Pain Type  Acute pain    Pain Radiating Towards  N/A    Pain Onset  In the past 7 days    Pain Frequency  Constant    Aggravating Factors   New exercises    Pain Relieving Factors  rest, sleep, pain medication, heat    Effect of Pain on Daily Activities  mod effect    Multiple Pain Sites  No         [facility] OT Assessment - 01/13/18 0001      Assessment   Medical Diagnosis  Left RCR      Precautions   Precautions  Shoulder    Type of Shoulder Precautions  See protocol from Dr. Remo Lipps Case               OT Treatments/Exercises (OP) - 01/13/18 1523      Exercises   Exercises  Shoulder      Shoulder Exercises: Supine   Protraction  PROM;5 reps;AROM;10 reps    Horizontal ABduction  PROM;5 reps;AROM;10 reps    External Rotation  PROM;5 reps    Internal Rotation  PROM;5 reps;AROM;10 reps    Flexion  PROM;5 reps;AROM;10 reps    ABduction  PROM;5 reps;AAROM;10 reps      Shoulder Exercises: Standing   Protraction  AROM;10 reps    Horizontal ABduction  AROM;10 reps    External Rotation  AROM;10 reps    Internal Rotation  AROM;10 reps    Flexion  AROM;10 reps    ABduction  AROM;10 reps;Limitations    ABduction Limitations  90 degrees      Shoulder Exercises: ROM/Strengthening   Wall Wash  1'    Over Head Lace  2'    Proximal Shoulder Strengthening, Supine  10 times no rest breaks     Proximal Shoulder Strengthening, Seated  10 times no rest breaks    Other ROM/Strengthening Exercises  finger ladder ascending and  descending to 18 3 repetitions. flexion and abduction complete.       Manual Therapy   Manual Therapy  Myofascial release    Manual therapy comments  Manual therapy completed prior to exercises    Myofascial Release  Myofascial release and manual stretching completed to left upper arm, trapezius, and scapularis region to decrease fascial restrictions and increase joint mobility in a pain free zone.                OT Short Term Goals - 01/04/18 1738      OT SHORT TERM GOAL #1   Title  Patient will be educated and independent with HEP to increase functional use of LUE during daily tasks.     Time  6    Period  Weeks      OT SHORT TERM GOAL #2   Title  patient will increase P/ROM of LUE to [facility] in order to increase ability to complete dressing tasks.    Time  6    Period  Weeks      OT SHORT TERM GOAL #3   Title  Patient will increase LUE strength to 3+/5 to increase ability to complete waist level activities.     Time  6    Period  Weeks    Status  On-going      OT SHORT TERM GOAL #4   Title  Patient will decrease pain level in LUE during daily tasks to 3/10 or less.     Time   6    Period  Weeks    Status  On-going      OT SHORT TERM GOAL #5   Title  Patient will decrease fascial restrictions in LUE to min amount in order to increase functional mobility needed for reaching tasks.     Time  6    Period  Weeks        OT Long Term Goals - 12/03/17 0825      OT LONG TERM GOAL #1   Title  Patient will return to highest level of independence using his LUE for all daily tasks.     Time  12    Period  Weeks    Status  On-going      OT LONG TERM GOAL #2   Title  Patient will increase A/ROM to WNL to increase ability to complete overhead reaching tasks.     Time  12    Period  Weeks    Status  On-going      OT LONG TERM GOAL #3   Title  Patient will increase LUE strength to 4+/5 to increase ability to return to work related tasks.     Time  12    Period  Weeks    Status  On-going      OT LONG TERM GOAL #4   Title  Patient will decrease pain level to 2/10 or less when completing daily tasks.     Time  12    Period  Weeks    Status  On-going      OT LONG TERM GOAL #5   Title  Patient to decrease fascial restrictions in LUE to trace amount in order to increase functional mobility needed to return to reaching tasks.     Time  12    Period  Weeks    Status  On-going            Plan - 01/13/18 1646    Clinical  Impression Statement  A: Continued with A/ROM supine and standing. While supine, patient was unable to complete Abduction without assistance from dowel rod. Unable to achieve full ROM at this time when standing. Abduction is limited the most. VC for form and technique.     Plan  P: Continue to work on proximal shoulder strengthening in order to be able to complete A/ROM exercises with better form and technique. Complete scapular strengthening.     Consulted and Agree with Plan of Care  Patient       Patient will benefit from skilled therapeutic intervention in order to improve the following deficits and impairments:  Decreased strength,  Decreased range of motion, Pain, Impaired UE functional use, Increased fascial restrictions  Visit Diagnosis: Other symptoms and signs involving the musculoskeletal system  Stiffness of left shoulder, not elsewhere classified  Acute pain of left shoulder    Problem List There are no active problems to display for this patient.   Ailene Ravel, OTR/L,CBIS  [phone]  01/13/2018, 4:48 PM  [place] [address] [place], [place], [address] Phone: [phone]   Fax:  [phone]  Name: Derrick Clark MRN: [id number] Date of Birth: May 25, 1961

## 2018-01-18 ENCOUNTER — Ambulatory Visit (HOSPITAL_COMMUNITY): Payer: BLUE CROSS/BLUE SHIELD

## 2018-01-18 ENCOUNTER — Encounter (HOSPITAL_COMMUNITY): Payer: Self-pay

## 2018-01-18 DIAGNOSIS — M25512 Pain in left shoulder: Secondary | ICD-10-CM

## 2018-01-18 DIAGNOSIS — R29898 Other symptoms and signs involving the musculoskeletal system: Secondary | ICD-10-CM

## 2018-01-18 DIAGNOSIS — M25612 Stiffness of left shoulder, not elsewhere classified: Secondary | ICD-10-CM

## 2018-01-18 NOTE — Therapy (Signed)
[place], [place], [address] Phone: [phone]   Fax:  [phone]  Occupational Therapy Treatment  Patient Details  Name: Derrick Clark MRN: [id number] Date of Birth: May 15, 1961 Referring Provider: Remo Lipps Case, MD   Encounter Date: 01/18/2018  OT End of Session - 01/18/18 1728    Visit Number  12    Number of Visits  24    Date for OT Re-Evaluation  01/30/18    Authorization Type  [employer] other     Authorization Time Period  30 visits combined OT/chiropractor 0 used. Benefit period: calendar year    Authorization - Visit Number  6    Authorization - Number of Visits  30    OT Start Time  6160    OT Stop Time  1730    OT Time Calculation (min)  43 min    Activity Tolerance  Patient tolerated treatment well    Behavior During Therapy  WFL for tasks assessed/performed       Past Medical History:  Diagnosis Date  . Diabetes mellitus without complication ([place])     History reviewed. No pertinent surgical history.  There were no vitals filed for this visit.  Subjective Assessment - 01/18/18 1700    Subjective   S: It's grinding a lot in there.     Currently in Pain?  Yes    Pain Score  5     Pain Location  Shoulder    Pain Orientation  Left    Pain Descriptors / Indicators  Sore         [facility] OT Assessment - 01/18/18 1700      Assessment   Medical Diagnosis  Left RCR      Precautions   Precautions  Shoulder    Type of Shoulder Precautions  See protocol from Dr. Remo Lipps Case               OT Treatments/Exercises (OP) - 01/18/18 1700      Exercises   Exercises  Shoulder      Shoulder Exercises: Supine   Protraction  PROM;5 reps;AROM;15 reps    Horizontal ABduction  PROM;5 reps;AROM;15 reps    External Rotation  PROM;5 reps;AROM;15 reps    Internal Rotation  PROM;5 reps;AROM;15 reps    Flexion  PROM;5 reps;AROM;15 reps    ABduction  PROM;5 reps;AAROM AA/ROM: 3-4 reps. A/ROM completed  with remaining repetitions      Shoulder Exercises: Standing   Protraction  AROM;12 reps    Horizontal ABduction  AROM;12 reps    External Rotation  AROM;Theraband;12 reps slightly abducted    Theraband Level (Shoulder External Rotation)  Level 2 (Red)    Internal Rotation  AROM;12 reps slightly abducted    Flexion  AROM;15 reps    ABduction  AROM;12 reps;Limitations    ABduction Limitations  90 degrees    Extension  Theraband;12 reps    Theraband Level (Shoulder Extension)  Level 2 (Red)    Row  Theraband;12 reps    Theraband Level (Shoulder Row)  Level 2 (Red)    Retraction  Theraband;12 reps    Theraband Level (Shoulder Retraction)  Level 2 (Red)    Other Standing Exercises  Horzontal abduction with serratus anterior plus, red band, 3 times going up.      Shoulder Exercises: ROM/Strengthening   UBE (Upper Arm Bike)  Level 1 2' forward 2' reverse    Over Head Lace  2'  Proximal Shoulder Strengthening, Supine  15 times no rest breaks     Proximal Shoulder Strengthening, Seated  12X no rest breaks      Manual Therapy   Manual Therapy  Joint mobilization    Manual therapy comments  Manual therapy completed prior to exercises    Joint Mobilization  Joint mobilization completed to left upper arm to increase ROM and mobilize soft tissue and joint.                OT Short Term Goals - 01/04/18 1738      OT SHORT TERM GOAL #1   Title  Patient will be educated and independent with HEP to increase functional use of LUE during daily tasks.     Time  6    Period  Weeks      OT SHORT TERM GOAL #2   Title  patient will increase P/ROM of LUE to [facility] in order to increase ability to complete dressing tasks.    Time  6    Period  Weeks      OT SHORT TERM GOAL #3   Title  Patient will increase LUE strength to 3+/5 to increase ability to complete waist level activities.     Time  6    Period  Weeks    Status  On-going      OT SHORT TERM GOAL #4   Title  Patient will decrease  pain level in LUE during daily tasks to 3/10 or less.     Time  6    Period  Weeks    Status  On-going      OT SHORT TERM GOAL #5   Title  Patient will decrease fascial restrictions in LUE to min amount in order to increase functional mobility needed for reaching tasks.     Time  6    Period  Weeks        OT Long Term Goals - 12/03/17 0825      OT LONG TERM GOAL #1   Title  Patient will return to highest level of independence using his LUE for all daily tasks.     Time  12    Period  Weeks    Status  On-going      OT LONG TERM GOAL #2   Title  Patient will increase A/ROM to WNL to increase ability to complete overhead reaching tasks.     Time  12    Period  Weeks    Status  On-going      OT LONG TERM GOAL #3   Title  Patient will increase LUE strength to 4+/5 to increase ability to return to work related tasks.     Time  12    Period  Weeks    Status  On-going      OT LONG TERM GOAL #4   Title  Patient will decrease pain level to 2/10 or less when completing daily tasks.     Time  12    Period  Weeks    Status  On-going      OT LONG TERM GOAL #5   Title  Patient to decrease fascial restrictions in LUE to trace amount in order to increase functional mobility needed to return to reaching tasks.     Time  12    Period  Weeks    Status  On-going            Plan - 01/18/18 1728  Clinical Impression Statement  A: While supine, patient did use dowel rod for 3-4 repetitions then completed remaining repetitions without. Added scapular theraband exercises and UBE bike. Form was better during overhead lacing.     Plan  P: Continue to work on scapular strengthening. Attempt sidelying A/ROM.        Patient will benefit from skilled therapeutic intervention in order to improve the following deficits and impairments:  Decreased strength, Decreased range of motion, Pain, Impaired UE functional use, Increased fascial restrictions  Visit Diagnosis: Other symptoms and  signs involving the musculoskeletal system  Stiffness of left shoulder, not elsewhere classified  Acute pain of left shoulder    Problem List There are no active problems to display for this patient.   Ailene Ravel, OTR/L,CBIS  [phone]  01/18/2018, 5:37 PM  [facility] [address] [place], [place], [address] Phone: [phone]   Fax:  [phone]  Name: TORSTEN WENIGER MRN: [id number] Date of Birth: [birth year]

## 2018-01-20 ENCOUNTER — Encounter (HOSPITAL_COMMUNITY): Payer: Self-pay

## 2018-01-20 ENCOUNTER — Ambulatory Visit (HOSPITAL_COMMUNITY): Payer: BLUE CROSS/BLUE SHIELD

## 2018-01-20 DIAGNOSIS — M25612 Stiffness of left shoulder, not elsewhere classified: Secondary | ICD-10-CM

## 2018-01-20 DIAGNOSIS — R29898 Other symptoms and signs involving the musculoskeletal system: Secondary | ICD-10-CM | POA: Diagnosis not present

## 2018-01-20 DIAGNOSIS — M25512 Pain in left shoulder: Secondary | ICD-10-CM

## 2018-01-20 NOTE — Therapy (Signed)
[place], [place], [address] Phone: [phone]   Fax:  [phone]  Occupational Therapy Treatment  Patient Details  Name: Derrick Clark MRN: [id number] Date of Birth: 02-24-1961 Referring Provider: Remo Lipps Case, MD   Encounter Date: 01/20/2018  OT End of Session - 01/20/18 1341    Visit Number  13    Number of Visits  24    Date for OT Re-Evaluation  01/30/18    Authorization Type  [employer] other     Authorization Time Period  30 visits combined OT/chiropractor 0 used. Benefit period: calendar year    Authorization - Visit Number  7    Authorization - Number of Visits  30    OT Start Time  2542    OT Stop Time  1345    OT Time Calculation (min)  42 min    Activity Tolerance  Patient tolerated treatment well    Behavior During Therapy  WFL for tasks assessed/performed       Past Medical History:  Diagnosis Date  . Diabetes mellitus without complication ([place])     History reviewed. No pertinent surgical history.  There were no vitals filed for this visit.  Subjective Assessment - 01/20/18 1323    Subjective   S: It feels tight in the front today.    Currently in Pain?  Yes    Pain Score  5     Pain Location  Shoulder    Pain Orientation  Left    Pain Descriptors / Indicators  Sore         [facility] OT Assessment - 01/20/18 1325      Assessment   Medical Diagnosis  Left RCR      Precautions   Precautions  Shoulder    Type of Shoulder Precautions  See protocol from Dr. Remo Lipps Case               OT Treatments/Exercises (OP) - 01/20/18 1324      Exercises   Exercises  Shoulder      Shoulder Exercises: Supine   Protraction  PROM;5 reps;AROM;15 reps    Horizontal ABduction  PROM;5 reps;AROM;15 reps    External Rotation  PROM;5 reps;AROM;15 reps    Internal Rotation  PROM;5 reps;AROM;15 reps    Flexion  PROM;5 reps;AROM;15 reps    ABduction  PROM;5 reps;AROM;15 reps      Shoulder  Exercises: Sidelying   External Rotation  AROM;15 reps    Internal Rotation  AROM;15 reps    Flexion  AROM;15 reps    ABduction  AROM;15 reps    Other Sidelying Exercises  Protraction, 15X, A/ROM    Other Sidelying Exercises  Horziontal abduction; 15X; A/ROM      Shoulder Exercises: Standing   Internal Rotation  Theraband;12 reps    Theraband Level (Shoulder Internal Rotation)  Level 2 (Red)    Extension  Theraband;12 reps    Theraband Level (Shoulder Extension)  Level 2 (Red)    Row  Theraband;12 reps    Theraband Level (Shoulder Row)  Level 2 (Red)    Retraction  Theraband;12 reps    Theraband Level (Shoulder Retraction)  Level 2 (Red)    Other Standing Exercises  Horzontal abduction with serratus anterior plus, red band, 3 times going up.      Shoulder Exercises: ROM/Strengthening   UBE (Upper Arm Bike)  Level 2 2' forward 2' reverse    Proximal Shoulder Strengthening, Supine  15  times no rest breaks       Manual Therapy   Manual Therapy  Joint mobilization;Myofascial release    Manual therapy comments  Manual therapy completed prior to exercises    Joint Mobilization  Joint mobilization completed to left upper arm to increase ROM and mobilize soft tissue and joint.     Myofascial Release  Myofascial release and manual stretching completed to left upper arm, trapezius, and scapularis region to decrease fascial restrictions and increase joint mobility in a pain free zone.              OT Education - 01/20/18 1405    Education provided  Yes    Education Details  scapular theraband strengthening and external rotation strengthening with red band.     Person(s) Educated  Patient    Methods  Explanation;Handout;Demonstration;Verbal cues    Comprehension  Verbalized understanding;Returned demonstration       OT Short Term Goals - 01/04/18 1738      OT SHORT TERM GOAL #1   Title  Patient will be educated and independent with HEP to increase functional use of LUE during  daily tasks.     Time  6    Period  Weeks      OT SHORT TERM GOAL #2   Title  patient will increase P/ROM of LUE to [facility] in order to increase ability to complete dressing tasks.    Time  6    Period  Weeks      OT SHORT TERM GOAL #3   Title  Patient will increase LUE strength to 3+/5 to increase ability to complete waist level activities.     Time  6    Period  Weeks    Status  On-going      OT SHORT TERM GOAL #4   Title  Patient will decrease pain level in LUE during daily tasks to 3/10 or less.     Time  6    Period  Weeks    Status  On-going      OT SHORT TERM GOAL #5   Title  Patient will decrease fascial restrictions in LUE to min amount in order to increase functional mobility needed for reaching tasks.     Time  6    Period  Weeks        OT Long Term Goals - 12/03/17 0825      OT LONG TERM GOAL #1   Title  Patient will return to highest level of independence using his LUE for all daily tasks.     Time  12    Period  Weeks    Status  On-going      OT LONG TERM GOAL #2   Title  Patient will increase A/ROM to WNL to increase ability to complete overhead reaching tasks.     Time  12    Period  Weeks    Status  On-going      OT LONG TERM GOAL #3   Title  Patient will increase LUE strength to 4+/5 to increase ability to return to work related tasks.     Time  12    Period  Weeks    Status  On-going      OT LONG TERM GOAL #4   Title  Patient will decrease pain level to 2/10 or less when completing daily tasks.     Time  12    Period  Weeks    Status  On-going  OT LONG TERM GOAL #5   Title  Patient to decrease fascial restrictions in LUE to trace amount in order to increase functional mobility needed to return to reaching tasks.     Time  12    Period  Weeks    Status  On-going            Plan - 01/20/18 1406    Clinical Impression Statement  A: Continued with joint mobilization techniques. patient did have some audible popping in joint prior to  joint positioning. Updated HEP and reviwed. VC for form and technique.    Plan  P: Add 1# weight to supine exercises.        Patient will benefit from skilled therapeutic intervention in order to improve the following deficits and impairments:  Decreased strength, Decreased range of motion, Pain, Impaired UE functional use, Increased fascial restrictions  Visit Diagnosis: Other symptoms and signs involving the musculoskeletal system  Stiffness of left shoulder, not elsewhere classified  Acute pain of left shoulder    Problem List There are no active problems to display for this patient.  Ailene Ravel, OTR/L,CBIS  [phone]  01/20/2018, 2:08 PM  [facility] [address] [place], [place], [address] Phone: [phone]   Fax:  [phone]  Name: Derrick Clark MRN: [id number] Date of Birth: 1961-12-01

## 2018-01-20 NOTE — Patient Instructions (Signed)
(  Home) Extension: Isometric / Bilateral Arm Retraction - Sitting   Facing anchor, hold hands and elbow at shoulder height, with elbow bent.  Pull arms back to squeeze shoulder blades together. Repeat 10-15 times. 1-3 times/day.   (Clinic) Extension / Flexion (Assist)   Face anchor, pull arms back, keeping elbow straight, and squeze shoulder blades together. Repeat 10-15 times. 1-3 times/day.   Copyright  VHI. All rights reserved.   (Home) Retraction: Row - Bilateral (Anchor)   Facing anchor, arms reaching forward, pull hands toward stomach, keeping elbows bent and at your sides and pinching shoulder blades together. Repeat 10-15 times. 1-3 times/day.   Copyright  VHI. All rights reserved.   ELASTIC BAND SHOULDER EXTERNAL ROTATION - ER  While holding an elastic band at your side with your elbow bent, start with your hand near your stomach and then pull the band away. Keep your elbow at your side the entire time. Complete 12 times.

## 2018-01-25 ENCOUNTER — Telehealth (HOSPITAL_COMMUNITY): Payer: Self-pay | Admitting: *Deleted

## 2018-01-25 ENCOUNTER — Ambulatory Visit (HOSPITAL_COMMUNITY): Payer: BLUE CROSS/BLUE SHIELD

## 2018-01-25 NOTE — Telephone Encounter (Signed)
01/25/18  wife called and cx appt said her husband just wasn't feeling good

## 2018-01-27 ENCOUNTER — Ambulatory Visit (HOSPITAL_COMMUNITY): Payer: BLUE CROSS/BLUE SHIELD

## 2018-01-27 ENCOUNTER — Telehealth (HOSPITAL_COMMUNITY): Payer: Self-pay | Admitting: *Deleted

## 2018-01-27 NOTE — Telephone Encounter (Signed)
01/27/18  pt cx said something going on with stomach... has some kind of a virus

## 2018-02-01 ENCOUNTER — Ambulatory Visit (HOSPITAL_COMMUNITY): Payer: BLUE CROSS/BLUE SHIELD | Attending: Orthopedic Surgery

## 2018-02-01 DIAGNOSIS — M25612 Stiffness of left shoulder, not elsewhere classified: Secondary | ICD-10-CM | POA: Diagnosis not present

## 2018-02-01 DIAGNOSIS — M25512 Pain in left shoulder: Secondary | ICD-10-CM | POA: Diagnosis not present

## 2018-02-01 DIAGNOSIS — R29898 Other symptoms and signs involving the musculoskeletal system: Secondary | ICD-10-CM | POA: Insufficient documentation

## 2018-02-01 NOTE — Therapy (Signed)
[place], [place], [address] Phone: [phone]   Fax:  [phone]  Occupational Therapy Treatment  Patient Details  Name: Derrick Clark MRN: [id number] Date of Birth: [birth year] Referring Provider: Remo Lipps Case, MD   Encounter Date: 02/01/2018  OT End of Session - 02/01/18 1754    Visit Number  14    Number of Visits  24    Date for OT Re-Evaluation  03/03/18    Authorization Type  [employer] other     Authorization Time Period  30 visits combined OT/chiropractor 0 used. Benefit period: calendar year    Authorization - Visit Number  8    Authorization - Number of Visits  30    OT Start Time  1324    OT Stop Time  1730    OT Time Calculation (min)  40 min    Activity Tolerance  Patient tolerated treatment well    Behavior During Therapy  WFL for tasks assessed/performed       Past Medical History:  Diagnosis Date  . Diabetes mellitus without complication ([place])     No past surgical history on file.  There were no vitals filed for this visit.  Subjective Assessment - 02/01/18 1751    Subjective   S: I saw the doctor today and he gave me a whole bunch of exercises that I should be doing.     Special Tests  FOTO score: 59/100    Currently in Pain?  Yes    Pain Score  4     Pain Location  Shoulder    Pain Orientation  Left    Pain Descriptors / Indicators  Sore    Pain Type  Acute pain    Pain Radiating Towards  N/A    Pain Onset  1 to 4 weeks ago    Pain Frequency  Constant    Aggravating Factors   new exercises and increased use.    Pain Relieving Factors  rest, sleep, pain medication, heat    Effect of Pain on Daily Activities  mod effect    Multiple Pain Sites  No         [facility] OT Assessment - 02/01/18 1653      Assessment   Medical Diagnosis  Left RCR    Onset Date/Surgical Date  11/13/17      Precautions   Precautions  Shoulder    Type of Shoulder Precautions  See protocol from Dr. Remo Lipps  Case      Prior Function   Level of Independence  Independent      Observation/Other Assessments   Focus on Therapeutic Outcomes (FOTO)   59/100      ROM / Strength   AROM / PROM / Strength  AROM;PROM;Strength      AROM   Overall AROM Comments  Assessed standing. IR/er adducted.    AROM Assessment Site  Shoulder    Right/Left Shoulder  Left    Left Shoulder Flexion  150 Degrees    Left Shoulder ABduction  131 Degrees    Left Shoulder Internal Rotation  90 Degrees    Left Shoulder External Rotation  55 Degrees      PROM   Overall PROM Comments  Assessed supine. IR/er adducted.    PROM Assessment Site  Shoulder    Right/Left Shoulder  Left    Left Shoulder Flexion  160 Degrees previous: 160    Left Shoulder ABduction  180 Degrees previous:  160    Left Shoulder Internal Rotation  90 Degrees previous: same    Left Shoulder External Rotation  70 Degrees previous: 50      Strength   Overall Strength Comments  Assessed standing. IR/er adducted    Strength Assessment Site  Shoulder    Right/Left Shoulder  Left    Left Shoulder Flexion  3/5    Left Shoulder ABduction  3/5    Left Shoulder Internal Rotation  5/5    Left Shoulder External Rotation  3/5               OT Treatments/Exercises (OP) - 02/01/18 1704      Exercises   Exercises  Shoulder      Shoulder Exercises: Supine   Protraction  PROM;5 reps;Strengthening;10 reps    Protraction Weight (lbs)  1    Horizontal ABduction  PROM;5 reps;Strengthening;10 reps    Horizontal ABduction Weight (lbs)  1    External Rotation  PROM;5 reps;Strengthening;10 reps    External Rotation Weight (lbs)  1    Internal Rotation  PROM;5 reps;Strengthening;10 reps    Internal Rotation Weight (lbs)  1    Flexion  PROM;5 reps;Strengthening;10 reps    Shoulder Flexion Weight (lbs)  1    ABduction  PROM;5 reps;Strengthening;10 reps    Shoulder ABduction Weight (lbs)  1      Shoulder Exercises: Standing   Protraction   Strengthening;10 reps    Protraction Weight (lbs)  1    Horizontal ABduction  Strengthening;10 reps;Theraband;12 reps    Theraband Level (Shoulder Horizontal ABduction)  Level 3 (Green)    Horizontal ABduction Weight (lbs)  1    External Rotation  Strengthening;10 reps;Theraband;12 reps    Theraband Level (Shoulder External Rotation)  Level 2 (Red)    External Rotation Weight (lbs)  1    Internal Rotation  Strengthening;10 reps    Internal Rotation Weight (lbs)  1    Flexion  Strengthening;10 reps    Shoulder Flexion Weight (lbs)  1    ABduction  AROM;12 reps    ABduction Limitations  90 degrees    Extension  Theraband;12 reps    Theraband Level (Shoulder Extension)  Level 3 (Green)    Row  [employer] reps    Theraband Level (Shoulder Row)  Level 3 (Green)    Retraction  Theraband;12 reps    Theraband Level (Shoulder Retraction)  Level 3 (Green)      Shoulder Exercises: ROM/Strengthening   UBE (Upper Arm Bike)  Level 2 2' forward 2' reverse    X to V Arms  10X    Proximal Shoulder Strengthening, Supine  10X no rest breaks. 1#    Proximal Shoulder Strengthening, Seated  10X with13      Manual Therapy   Manual Therapy  Myofascial release    Manual therapy comments  Manual therapy completed prior to exercises    Myofascial Release  Myofascial release and manual stretching completed to left upper arm, trapezius, and scapularis region to decrease fascial restrictions and increase joint mobility in a pain free zone.                OT Short Term Goals - 02/01/18 1720      OT SHORT TERM GOAL #1   Title  Patient will be educated and independent with HEP to increase functional use of LUE during daily tasks.     Time  6    Period  Weeks  OT SHORT TERM GOAL #2   Title  patient will increase P/ROM of LUE to [facility] in order to increase ability to complete dressing tasks.    Time  6    Period  Weeks      OT SHORT TERM GOAL #3   Title  Patient will increase LUE strength to  3+/5 to increase ability to complete waist level activities.     Time  6    Period  Weeks    Status  On-going      OT SHORT TERM GOAL #4   Title  Patient will decrease pain level in LUE during daily tasks to 3/10 or less.     Time  6    Period  Weeks    Status  Partially Met      OT SHORT TERM GOAL #5   Title  Patient will decrease fascial restrictions in LUE to min amount in order to increase functional mobility needed for reaching tasks.     Time  6    Period  Weeks        OT Long Term Goals - 02/01/18 1721      OT LONG TERM GOAL #1   Title  Patient will return to highest level of independence using his LUE for all daily tasks.     Time  12    Period  Weeks    Status  On-going      OT LONG TERM GOAL #2   Title  Patient will increase A/ROM to WNL to increase ability to complete overhead reaching tasks.     Time  12    Period  Weeks    Status  On-going      OT LONG TERM GOAL #3   Title  Patient will increase LUE strength to 4+/5 to increase ability to return to work related tasks.     Time  12    Period  Weeks    Status  On-going      OT LONG TERM GOAL #4   Title  Patient will decrease pain level to 2/10 or less when completing daily tasks.     Time  12    Period  Weeks    Status  On-going      OT LONG TERM GOAL #5   Title  Patient to decrease fascial restrictions in LUE to trace amount in order to increase functional mobility needed to return to reaching tasks.     Time  12    Period  Weeks    Status  On-going            Plan - 02/01/18 1755    Clinical Impression Statement  A: Reassessment completed this date. Patient has progressed to strengthening with a 1# hand weight and using green theraband at this point in therapy. He has met 3/5 short term goals overall. Continues to have deficits with achieving full A/ROM, as well as his LUE strength. Pt reports that his MD appointment went well and he was given exercises in addition to the ones he has been  provided in clinic. Therapist requested that he bring those exercises in so we can review and select the appropriate ones for him to continue.     Plan  P: Review exercises from MD and recommend which ones are appropriate for him to continue. Continue OT services to focus on mentioned deficits. Complete sidelying-strengthening. Update HEP. Add Cybex row and press.     Consulted and Agree  with Plan of Care  Patient       Patient will benefit from skilled therapeutic intervention in order to improve the following deficits and impairments:  Decreased strength, Decreased range of motion, Pain, Impaired UE functional use, Increased fascial restrictions  Visit Diagnosis: Other symptoms and signs involving the musculoskeletal system - Plan: Ot plan of care cert/re-cert  Stiffness of left shoulder, not elsewhere classified - Plan: Ot plan of care cert/re-cert  Acute pain of left shoulder - Plan: Ot plan of care cert/re-cert    Problem List There are no active problems to display for this patient.  Ailene Ravel, OTR/L,CBIS  [phone]  02/01/2018, 6:00 PM  [facility] [address] [place], [place], [address] Phone: [phone]   Fax:  [phone]  Name: Derrick Clark MRN: [id number] Date of Birth: [birth year]

## 2018-02-03 ENCOUNTER — Ambulatory Visit (HOSPITAL_COMMUNITY): Payer: BLUE CROSS/BLUE SHIELD

## 2018-02-03 ENCOUNTER — Telehealth (HOSPITAL_COMMUNITY): Payer: Self-pay

## 2018-02-03 NOTE — Telephone Encounter (Signed)
he had to take his wife to the MD and they will not get back in town to make this apptment

## 2018-02-08 ENCOUNTER — Other Ambulatory Visit: Payer: Self-pay

## 2018-02-08 ENCOUNTER — Ambulatory Visit (HOSPITAL_COMMUNITY): Payer: BLUE CROSS/BLUE SHIELD

## 2018-02-08 ENCOUNTER — Encounter (HOSPITAL_COMMUNITY): Payer: Self-pay

## 2018-02-08 DIAGNOSIS — M25512 Pain in left shoulder: Secondary | ICD-10-CM | POA: Diagnosis not present

## 2018-02-08 DIAGNOSIS — R29898 Other symptoms and signs involving the musculoskeletal system: Secondary | ICD-10-CM | POA: Diagnosis not present

## 2018-02-08 DIAGNOSIS — M25612 Stiffness of left shoulder, not elsewhere classified: Secondary | ICD-10-CM

## 2018-02-08 NOTE — Therapy (Signed)
[place], [place], [address] Phone: [phone]   Fax:  [phone]  Occupational Therapy Treatment  Patient Details  Name: Derrick Clark MRN: [id number] Date of Birth: [birth year] Referring Provider: Remo Lipps Case, MD   Encounter Date: 02/08/2018  OT End of Session - 02/08/18 1724    Visit Number  15    Number of Visits  24    Date for OT Re-Evaluation  03/03/18    Authorization Type  [employer] other     Authorization Time Period  30 visits combined OT/chiropractor 0 used. Benefit period: calendar year    Authorization - Visit Number  9    Authorization - Number of Visits  30    OT Start Time  6945    OT Stop Time  1730    OT Time Calculation (min)  45 min    Activity Tolerance  Patient tolerated treatment well    Behavior During Therapy  WFL for tasks assessed/performed       Past Medical History:  Diagnosis Date  . Diabetes mellitus without complication ([place])     History reviewed. No pertinent surgical history.  There were no vitals filed for this visit.  Subjective Assessment - 02/08/18 1730    Subjective   S: My back pain is taking my mind off my arm.     Currently in Pain?  No/denies         [facility] OT Assessment - 02/08/18 1703      Assessment   Medical Diagnosis  Left RCR      Precautions   Precautions  Shoulder    Type of Shoulder Precautions  See protocol from Dr. Remo Lipps Case               OT Treatments/Exercises (OP) - 02/08/18 1703      Bed Mobility   Bed Mobility  --      Exercises   Exercises  Shoulder      Shoulder Exercises: Supine   Protraction  PROM;5 reps    Horizontal ABduction  PROM;5 reps    External Rotation  PROM;5 reps    Internal Rotation  PROM;5 reps    Flexion  PROM;5 reps;Strengthening;15 reps    Shoulder Flexion Weight (lbs)  1    ABduction  PROM;5 reps;AROM;15 reps      Shoulder Exercises: Sidelying   External Rotation  Strengthening;15 reps    External Rotation Weight (lbs)  1    Internal Rotation  Strengthening;15 reps    Internal Rotation Weight (lbs)  1    Flexion  Strengthening;15 reps    Flexion Weight (lbs)  1    ABduction  Strengthening;15 reps    ABduction Weight (lbs)  1    Other Sidelying Exercises  Protraction, 15X, 1@      Shoulder Exercises: Standing   Protraction  Strengthening;15 reps    Protraction Weight (lbs)  1    Horizontal ABduction  Strengthening;15 reps    Horizontal ABduction Weight (lbs)  1    External Rotation  Strengthening;15 reps    External Rotation Weight (lbs)  1    Internal Rotation  Strengthening;15 reps    Internal Rotation Weight (lbs)  1    Flexion  Strengthening;15 reps    Shoulder Flexion Weight (lbs)  1    ABduction  Strengthening;15 reps    ABduction Limitations  90 degrees      Shoulder Exercises: ROM/Strengthening   UBE (Upper Arm  Bike)  level 3 reverse 2' forward 2'     Proximal Shoulder Strengthening, Supine  15X no rest breaks. 1#    Proximal Shoulder Strengthening, Seated  12X with1#    Ball on Wall  1' flexion 1' abduction      Shoulder Exercises: Stretch   Corner Stretch  2 reps 15 seconds. Doorway    External Rotation Stretch  2 reps 15 seconds    Wall Stretch - Flexion  2 reps 15 seconds      Manual Therapy   Manual Therapy  Myofascial release    Manual therapy comments  Manual therapy completed prior to exercises    Myofascial Release  Myofascial release and manual stretching completed to left upper arm, trapezius, and scapularis region to decrease fascial restrictions and increase joint mobility in a pain free zone.                OT Short Term Goals - 02/01/18 1720      OT SHORT TERM GOAL #1   Title  Patient will be educated and independent with HEP to increase functional use of LUE during daily tasks.     Time  6    Period  Weeks      OT SHORT TERM GOAL #2   Title  patient will increase P/ROM of LUE to [facility] in order to increase ability to  complete dressing tasks.    Time  6    Period  Weeks      OT SHORT TERM GOAL #3   Title  Patient will increase LUE strength to 3+/5 to increase ability to complete waist level activities.     Time  6    Period  Weeks    Status  On-going      OT SHORT TERM GOAL #4   Title  Patient will decrease pain level in LUE during daily tasks to 3/10 or less.     Time  6    Period  Weeks    Status  Partially Met      OT SHORT TERM GOAL #5   Title  Patient will decrease fascial restrictions in LUE to min amount in order to increase functional mobility needed for reaching tasks.     Time  6    Period  Weeks        OT Long Term Goals - 02/01/18 1721      OT LONG TERM GOAL #1   Title  Patient will return to highest level of independence using his LUE for all daily tasks.     Time  12    Period  Weeks    Status  On-going      OT LONG TERM GOAL #2   Title  Patient will increase A/ROM to WNL to increase ability to complete overhead reaching tasks.     Time  12    Period  Weeks    Status  On-going      OT LONG TERM GOAL #3   Title  Patient will increase LUE strength to 4+/5 to increase ability to return to work related tasks.     Time  12    Period  Weeks    Status  On-going      OT LONG TERM GOAL #4   Title  Patient will decrease pain level to 2/10 or less when completing daily tasks.     Time  12    Period  Weeks    Status  On-going      OT LONG TERM GOAL #5   Title  Patient to decrease fascial restrictions in LUE to trace amount in order to increase functional mobility needed to return to reaching tasks.     Time  12    Period  Weeks    Status  On-going            Plan - 02/08/18 1727    Clinical Impression Statement  A: Increased repetitions for strengthening using 1#. Pt has increased popping and grinding with supine and standing abduction. Added shoulder stretches and increased UBE resistance. VC for form and technique as needed.     Plan  P: Add cybex row and press.  Continue with scapular strengthening with bands.     Consulted and Agree with Plan of Care  Patient       Patient will benefit from skilled therapeutic intervention in order to improve the following deficits and impairments:  Decreased strength, Decreased range of motion, Pain, Impaired UE functional use, Increased fascial restrictions  Visit Diagnosis: Other symptoms and signs involving the musculoskeletal system  Stiffness of left shoulder, not elsewhere classified  Acute pain of left shoulder    Problem List There are no active problems to display for this patient.  Ailene Ravel, OTR/L,CBIS  [phone]  02/08/2018, 5:31 PM  [facility] [address] [place], [place], [address] Phone: [phone]   Fax:  [phone]  Name: Derrick Clark MRN: [id number] Date of Birth: 1961/03/11

## 2018-02-10 ENCOUNTER — Ambulatory Visit (HOSPITAL_COMMUNITY): Payer: BLUE CROSS/BLUE SHIELD

## 2018-02-10 ENCOUNTER — Encounter (HOSPITAL_COMMUNITY): Payer: Self-pay

## 2018-02-10 ENCOUNTER — Other Ambulatory Visit: Payer: Self-pay

## 2018-02-10 DIAGNOSIS — M25612 Stiffness of left shoulder, not elsewhere classified: Secondary | ICD-10-CM | POA: Diagnosis not present

## 2018-02-10 DIAGNOSIS — R29898 Other symptoms and signs involving the musculoskeletal system: Secondary | ICD-10-CM | POA: Diagnosis not present

## 2018-02-10 DIAGNOSIS — M25512 Pain in left shoulder: Secondary | ICD-10-CM

## 2018-02-10 NOTE — Therapy (Signed)
[place], [place], [address] Phone: [phone]   Fax:  [phone]  Occupational Therapy Treatment  Patient Details  Name: Derrick Clark MRN: [id number] Date of Birth: [birth year] Referring Provider: Remo Lipps Case, MD   Encounter Date: 02/10/2018  OT End of Session - 02/10/18 1739    Visit Number  16    Number of Visits  24    Date for OT Re-Evaluation  03/03/18    Authorization Type  [employer] other     Authorization Time Period  30 visits combined OT/chiropractor 0 used. Benefit period: calendar year    Authorization - Visit Number  10    Authorization - Number of Visits  30    OT Start Time  1540    OT Stop Time  1745    OT Time Calculation (min)  48 min    Activity Tolerance  Patient tolerated treatment well    Behavior During Therapy  WFL for tasks assessed/performed       Past Medical History:  Diagnosis Date  . Diabetes mellitus without complication ([place])     History reviewed. No pertinent surgical history.  There were no vitals filed for this visit.      [facility] OT Assessment - 02/10/18 1715      Assessment   Medical Diagnosis  Left RCR      Precautions   Precautions  Shoulder    Type of Shoulder Precautions  See protocol from Dr. Remo Lipps Case               OT Treatments/Exercises (OP) - 02/10/18 1715      Exercises   Exercises  Shoulder      Shoulder Exercises: Seated   Other Seated Exercises  [place] A/ROM, H1, H3 10X      Shoulder Exercises: Standing   Protraction  Strengthening;15 reps    Protraction Weight (lbs)  1    Horizontal ABduction  Strengthening;15 reps    Horizontal ABduction Weight (lbs)  1    External Rotation  Strengthening;15 reps    External Rotation Weight (lbs)  1    Internal Rotation  Strengthening;15 reps    Internal Rotation Weight (lbs)  1    Flexion  Strengthening;15 reps    Shoulder Flexion Weight (lbs)  1    ABduction  Strengthening;15 reps    ABduction Limitations  90 degrees    Extension  Theraband;12 reps    Theraband Level (Shoulder Extension)  Level 3 (Green)    Row  [employer] reps    Theraband Level (Shoulder Row)  Level 3 (Green)    Retraction  Theraband;12 reps    Theraband Level (Shoulder Retraction)  Level 3 (Green)    Other Standing Exercises  Horzontal abduction with serratus anterior plus, red band, 5 times going up.      Shoulder Exercises: ROM/Strengthening   UBE (Upper Arm Bike)  level 3 reverse 2' forward 2'     Cybex Press  15 reps 7 plate    Cybex Row  15 reps 5 plate    X to V Arms  10X with 1#    Proximal Shoulder Strengthening, Seated  12X with1#    Ball on Wall  1' flexion 1' abduction      Manual Therapy   Manual Therapy  Myofascial release    Manual therapy comments  Manual therapy completed prior to exercises    Myofascial Release  Myofascial release and manual stretching completed  to left upper arm, trapezius, and scapularis region to decrease fascial restrictions and increase joint mobility in a pain free zone.                OT Short Term Goals - 02/01/18 1720      OT SHORT TERM GOAL #1   Title  Patient will be educated and independent with HEP to increase functional use of LUE during daily tasks.     Time  6    Period  Weeks      OT SHORT TERM GOAL #2   Title  patient will increase P/ROM of LUE to [facility] in order to increase ability to complete dressing tasks.    Time  6    Period  Weeks      OT SHORT TERM GOAL #3   Title  Patient will increase LUE strength to 3+/5 to increase ability to complete waist level activities.     Time  6    Period  Weeks    Status  On-going      OT SHORT TERM GOAL #4   Title  Patient will decrease pain level in LUE during daily tasks to 3/10 or less.     Time  6    Period  Weeks    Status  Partially Met      OT SHORT TERM GOAL #5   Title  Patient will decrease fascial restrictions in LUE to min amount in order to increase functional mobility  needed for reaching tasks.     Time  6    Period  Weeks        OT Long Term Goals - 02/01/18 1721      OT LONG TERM GOAL #1   Title  Patient will return to highest level of independence using his LUE for all daily tasks.     Time  12    Period  Weeks    Status  On-going      OT LONG TERM GOAL #2   Title  Patient will increase A/ROM to WNL to increase ability to complete overhead reaching tasks.     Time  12    Period  Weeks    Status  On-going      OT LONG TERM GOAL #3   Title  Patient will increase LUE strength to 4+/5 to increase ability to return to work related tasks.     Time  12    Period  Weeks    Status  On-going      OT LONG TERM GOAL #4   Title  Patient will decrease pain level to 2/10 or less when completing daily tasks.     Time  12    Period  Weeks    Status  On-going      OT LONG TERM GOAL #5   Title  Patient to decrease fascial restrictions in LUE to trace amount in order to increase functional mobility needed to return to reaching tasks.     Time  12    Period  Weeks    Status  On-going            Plan - 02/10/18 1740    Clinical Impression Statement  A: Added cybex row and press. Patient continues to have limitations with strength and has decreased ROM during flexion and abduction when strengthening. VC for form and technique as needed. Trigger points decreased this session.    Plan  P: Add horizontal abduction and  flexion with red band supine to increase scapular strengthening.        Patient will benefit from skilled therapeutic intervention in order to improve the following deficits and impairments:  Decreased strength, Decreased range of motion, Pain, Impaired UE functional use, Increased fascial restrictions  Visit Diagnosis: Other symptoms and signs involving the musculoskeletal system  Stiffness of left shoulder, not elsewhere classified  Acute pain of left shoulder    Problem List There are no active problems to display for this  patient.  Derrick Clark, OTR/L,CBIS  [phone]  02/10/2018, 5:53 PM  [facility] [address] [place], [place], [address] Phone: [phone]   Fax:  [phone]  Name: Derrick Clark MRN: [id number] Date of Birth: Jul 02, 1961

## 2018-02-15 ENCOUNTER — Ambulatory Visit (HOSPITAL_COMMUNITY): Payer: BLUE CROSS/BLUE SHIELD

## 2018-02-15 ENCOUNTER — Telehealth (HOSPITAL_COMMUNITY): Payer: Self-pay

## 2018-02-15 NOTE — Telephone Encounter (Signed)
Wife called stating he is stuck in traffic coming back to town from his job and will not be able to keep this appointment.

## 2018-02-17 ENCOUNTER — Encounter (HOSPITAL_COMMUNITY): Payer: Self-pay

## 2018-02-17 ENCOUNTER — Other Ambulatory Visit: Payer: Self-pay

## 2018-02-17 ENCOUNTER — Ambulatory Visit (HOSPITAL_COMMUNITY): Payer: BLUE CROSS/BLUE SHIELD

## 2018-02-17 DIAGNOSIS — R29898 Other symptoms and signs involving the musculoskeletal system: Secondary | ICD-10-CM | POA: Diagnosis not present

## 2018-02-17 DIAGNOSIS — M25612 Stiffness of left shoulder, not elsewhere classified: Secondary | ICD-10-CM

## 2018-02-17 DIAGNOSIS — M25512 Pain in left shoulder: Secondary | ICD-10-CM | POA: Diagnosis not present

## 2018-02-17 NOTE — Therapy (Signed)
[place], [place], [address] Phone: [phone]   Fax:  [phone]  Occupational Therapy Treatment  Patient Details  Name: Derrick Clark MRN: [id number] Date of Birth: Apr 25, 1961 Referring Provider: Remo Lipps Case, MD   Encounter Date: 02/17/2018  OT End of Session - 02/17/18 1749    Visit Number  17    Number of Visits  24    Date for OT Re-Evaluation  03/03/18    Authorization Type  [employer] other     Authorization Time Period  30 visits combined OT/chiropractor 0 used. Benefit period: calendar year    Authorization - Visit Number  11    Authorization - Number of Visits  30    OT Start Time  2426    OT Stop Time  1730    OT Time Calculation (min)  40 min    Activity Tolerance  Patient tolerated treatment well    Behavior During Therapy  WFL for tasks assessed/performed       Past Medical History:  Diagnosis Date  . Diabetes mellitus without complication ([place])     History reviewed. No pertinent surgical history.  There were no vitals filed for this visit.  Subjective Assessment - 02/17/18 1707    Subjective   S: It feels better now that we've done the weights.     Currently in Pain?  No/denies                   OT Treatments/Exercises (OP) - 02/17/18 1707      Exercises   Exercises  Shoulder      Shoulder Exercises: Supine   Protraction  PROM;Theraband;5 reps    Theraband Level (Shoulder Protraction)  Level 2 (Red)    Horizontal ABduction  PROM;5 reps;Theraband;10 reps    Theraband Level (Shoulder Horizontal ABduction)  Level 2 (Red)    External Rotation  PROM;5 reps    Internal Rotation  PROM;5 reps    Flexion  PROM;5 reps;Strengthening;15 reps;Theraband;10 reps    Theraband Level (Shoulder Flexion)  Level 2 (Red)    ABduction  PROM;5 reps;AROM;15 reps      Shoulder Exercises: Seated   Other Seated Exercises  [place] A/ROM, H1, H3 10X. Facing the wall.      Shoulder Exercises:  Sidelying   External Rotation  Strengthening;12 reps    External Rotation Weight (lbs)  2    Internal Rotation  Strengthening;12 reps    Internal Rotation Weight (lbs)  2    Flexion  Strengthening;12 reps    Flexion Weight (lbs)  2    ABduction  Strengthening;12 reps    ABduction Weight (lbs)  2    Other Sidelying Exercises  Protraction, 12X, 2#    Other Sidelying Exercises  Horzion[place] abduction; 12X 3#      Shoulder Exercises: Standing   Protraction  Strengthening;10 reps    Protraction Weight (lbs)  2    Horizontal ABduction  Strengthening;10 reps;Theraband;12 reps    Theraband Level (Shoulder Horizontal ABduction)  Level 3 (Green)    Horizontal ABduction Weight (lbs)  2    External Rotation  Strengthening;10 reps    External Rotation Weight (lbs)  2    Internal Rotation  Strengthening;10 reps    Internal Rotation Weight (lbs)  2    Flexion  Strengthening;10 reps;Theraband;12 reps    Theraband Level (Shoulder Flexion)  Level 3 (Green)    Shoulder Flexion Weight (lbs)  2    ABduction  Strengthening;12 reps    Shoulder ABduction Weight (lbs)  1    ABduction Limitations  90 degrees      Shoulder Exercises: ROM/Strengthening   X to V Arms  10X with 1#    Proximal Shoulder Strengthening, Seated  12X with1#    Ball on Wall  1' flexion 1' abduction    Other ROM/Strengthening Exercises  Ball drop 1' 90 degrees flexion      Manual Therapy   Manual Therapy  Myofascial release    Manual therapy comments  Manual therapy completed prior to exercises    Myofascial Release  Myofascial release and manual stretching completed to left upper arm, trapezius, and scapularis region to decrease fascial restrictions and increase joint mobility in a pain free zone.                OT Short Term Goals - 02/01/18 1720      OT SHORT TERM GOAL #1   Title  Patient will be educated and independent with HEP to increase functional use of LUE during daily tasks.     Time  6    Period  Weeks       OT SHORT TERM GOAL #2   Title  patient will increase P/ROM of LUE to [facility] in order to increase ability to complete dressing tasks.    Time  6    Period  Weeks      OT SHORT TERM GOAL #3   Title  Patient will increase LUE strength to 3+/5 to increase ability to complete waist level activities.     Time  6    Period  Weeks    Status  On-going      OT SHORT TERM GOAL #4   Title  Patient will decrease pain level in LUE during daily tasks to 3/10 or less.     Time  6    Period  Weeks    Status  Partially Met      OT SHORT TERM GOAL #5   Title  Patient will decrease fascial restrictions in LUE to min amount in order to increase functional mobility needed for reaching tasks.     Time  6    Period  Weeks        OT Long Term Goals - 02/01/18 1721      OT LONG TERM GOAL #1   Title  Patient will return to highest level of independence using his LUE for all daily tasks.     Time  12    Period  Weeks    Status  On-going      OT LONG TERM GOAL #2   Title  Patient will increase A/ROM to WNL to increase ability to complete overhead reaching tasks.     Time  12    Period  Weeks    Status  On-going      OT LONG TERM GOAL #3   Title  Patient will increase LUE strength to 4+/5 to increase ability to return to work related tasks.     Time  12    Period  Weeks    Status  On-going      OT LONG TERM GOAL #4   Title  Patient will decrease pain level to 2/10 or less when completing daily tasks.     Time  12    Period  Weeks    Status  On-going      OT LONG TERM GOAL #5  Title  Patient to decrease fascial restrictions in LUE to trace amount in order to increase functional mobility needed to return to reaching tasks.     Time  12    Period  Weeks    Status  On-going            Plan - 02/17/18 1750    Clinical Impression Statement  A: Pt continues to have deficits with strength more so when standing against gravity. Added theraband shoulder strengthening supine to focus on  weakness and patient posed with increased difficulty. Pt continues to have feeling of grinding and popping with shoulder movements especially abduction.     Plan  P: Continue with supine theraband strengthening.     Consulted and Agree with Plan of Care  Patient       Patient will benefit from skilled therapeutic intervention in order to improve the following deficits and impairments:  Decreased strength, Decreased range of motion, Pain, Impaired UE functional use, Increased fascial restrictions  Visit Diagnosis: Other symptoms and signs involving the musculoskeletal system  Stiffness of left shoulder, not elsewhere classified  Acute pain of left shoulder    Problem List There are no active problems to display for this patient.  Ailene Ravel, OTR/L,CBIS  [phone]  02/17/2018, 5:55 PM  [facility] [address] [place], [place], [address] Phone: [phone]   Fax:  [phone]  Name: LEAVY HEATHERLY MRN: [id number] Date of Birth: Oct 02, 1961

## 2018-02-24 ENCOUNTER — Encounter (HOSPITAL_COMMUNITY): Payer: Self-pay | Admitting: Occupational Therapy

## 2018-02-24 ENCOUNTER — Ambulatory Visit (HOSPITAL_COMMUNITY): Payer: BLUE CROSS/BLUE SHIELD | Admitting: Occupational Therapy

## 2018-02-24 DIAGNOSIS — M25512 Pain in left shoulder: Secondary | ICD-10-CM | POA: Diagnosis not present

## 2018-02-24 DIAGNOSIS — M25612 Stiffness of left shoulder, not elsewhere classified: Secondary | ICD-10-CM

## 2018-02-24 DIAGNOSIS — R29898 Other symptoms and signs involving the musculoskeletal system: Secondary | ICD-10-CM

## 2018-02-24 NOTE — Therapy (Signed)
[place], [place], [address] Phone: [phone]   Fax:  [phone]  Occupational Therapy Treatment  Patient Details  Name: Derrick Clark MRN: [id number] Date of Birth: May 03, 1961 Referring Provider: Remo Lipps Case, MD   Encounter Date: 02/24/2018  OT End of Session - 02/24/18 1601    Visit Number  18    Number of Visits  24    Date for OT Re-Evaluation  03/03/18    Authorization Type  [employer] other     Authorization Time Period  30 visits combined OT/chiropractor 0 used. Benefit period: calendar year    Authorization - Visit Number  12    Authorization - Number of Visits  30    OT Start Time  6962    OT Stop Time  1558    OT Time Calculation (min)  40 min    Activity Tolerance  Patient tolerated treatment well    Behavior During Therapy  WFL for tasks assessed/performed       Past Medical History:  Diagnosis Date  . Diabetes mellitus without complication ([place])     History reviewed. No pertinent surgical history.  There were no vitals filed for this visit.  Subjective Assessment - 02/24/18 1518    Subjective   S: It's a little sore today.     Currently in Pain?  Yes    Pain Score  6     Pain Location  Shoulder    Pain Orientation  Left    Pain Descriptors / Indicators  Aching;Sore    Pain Type  Acute pain    Pain Radiating Towards  n/a    Pain Onset  1 to 4 weeks ago    Pain Frequency  Constant    Aggravating Factors   weather, new exercises    Pain Relieving Factors  rest, sleep, pain medication, heat    Effect of Pain on Daily Activities  mod effect    Multiple Pain Sites  No         [facility] OT Assessment - 02/24/18 1518      Assessment   Medical Diagnosis  Left RCR      Precautions   Precautions  Shoulder    Type of Shoulder Precautions  See protocol from Dr. Remo Lipps Case               OT Treatments/Exercises (OP) - 02/24/18 1522      Exercises   Exercises  Shoulder      Shoulder Exercises: Supine   Protraction  PROM;5 reps;Theraband;10 reps    Theraband Level (Shoulder Protraction)  Level 2 (Red)    Horizontal ABduction  PROM;5 reps;Theraband;10 reps    Theraband Level (Shoulder Horizontal ABduction)  Level 2 (Red)    External Rotation  PROM;5 reps    Internal Rotation  PROM;5 reps    Flexion  PROM;5 reps;Strengthening;15 reps;Theraband;10 reps    Theraband Level (Shoulder Flexion)  Level 2 (Red)    ABduction  PROM;5 reps;AROM;15 reps      Shoulder Exercises: Sidelying   External Rotation  Strengthening;12 reps    External Rotation Weight (lbs)  2    Internal Rotation  Strengthening;12 reps    Internal Rotation Weight (lbs)  2    Flexion  Strengthening;12 reps    Flexion Weight (lbs)  2    ABduction  Strengthening;12 reps    ABduction Weight (lbs)  2    Other Sidelying Exercises  Protraction, 12X, 2#  Other Sidelying Exercises  Horziontal abduction; 12X 2#      Shoulder Exercises: Standing   Protraction  Strengthening;10 reps    Protraction Weight (lbs)  2    Horizontal ABduction  Strengthening;10 reps;Theraband;12 reps    Theraband Level (Shoulder Horizontal ABduction)  Level 3 (Green)    Horizontal ABduction Weight (lbs)  2    External Rotation  Strengthening;10 reps    External Rotation Weight (lbs)  2    Internal Rotation  Strengthening;10 reps    Internal Rotation Weight (lbs)  2    Flexion  Strengthening;10 reps    Theraband Level (Shoulder Flexion)  --    Shoulder Flexion Weight (lbs)  2    Flexion Limitations  90 degrees    ABduction  Strengthening;12 reps    Shoulder ABduction Weight (lbs)  1    ABduction Limitations  90 degrees    Extension  Theraband;15 reps    Theraband Level (Shoulder Extension)  Level 3 (Green)    Row  [employer] reps    Theraband Level (Shoulder Row)  Level 3 (Green)    Retraction  Theraband;15 reps    Theraband Level (Shoulder Retraction)  Level 3 (Green)    Other Standing Exercises  [place] A/ROM,  H1, H3 10X. Facing the wall.      Shoulder Exercises: ROM/Strengthening   X to V Arms  12X with 2#    Proximal Shoulder Strengthening, Seated  12X with 2#    Other ROM/Strengthening Exercises  Ball drop 1' 90 degrees flexion, 30" 90 degrees abduction      Manual Therapy   Manual Therapy  Myofascial release    Manual therapy comments  Manual therapy completed prior to exercises    Myofascial Release  Myofascial release and manual stretching completed to left upper arm, trapezius, and scapularis region to decrease fascial restrictions and increase joint mobility in a pain free zone.                OT Short Term Goals - 02/01/18 1720      OT SHORT TERM GOAL #1   Title  Patient will be educated and independent with HEP to increase functional use of LUE during daily tasks.     Time  6    Period  Weeks      OT SHORT TERM GOAL #2   Title  patient will increase P/ROM of LUE to [facility] in order to increase ability to complete dressing tasks.    Time  6    Period  Weeks      OT SHORT TERM GOAL #3   Title  Patient will increase LUE strength to 3+/5 to increase ability to complete waist level activities.     Time  6    Period  Weeks    Status  On-going      OT SHORT TERM GOAL #4   Title  Patient will decrease pain level in LUE during daily tasks to 3/10 or less.     Time  6    Period  Weeks    Status  Partially Met      OT SHORT TERM GOAL #5   Title  Patient will decrease fascial restrictions in LUE to min amount in order to increase functional mobility needed for reaching tasks.     Time  6    Period  Weeks        OT Long Term Goals - 02/01/18 1721      OT LONG  TERM GOAL #1   Title  Patient will return to highest level of independence using his LUE for all daily tasks.     Time  12    Period  Weeks    Status  On-going      OT LONG TERM GOAL #2   Title  Patient will increase A/ROM to WNL to increase ability to complete overhead reaching tasks.     Time  12    Period   Weeks    Status  On-going      OT LONG TERM GOAL #3   Title  Patient will increase LUE strength to 4+/5 to increase ability to return to work related tasks.     Time  12    Period  Weeks    Status  On-going      OT LONG TERM GOAL #4   Title  Patient will decrease pain level to 2/10 or less when completing daily tasks.     Time  12    Period  Weeks    Status  On-going      OT LONG TERM GOAL #5   Title  Patient to decrease fascial restrictions in LUE to trace amount in order to increase functional mobility needed to return to reaching tasks.     Time  12    Period  Weeks    Status  On-going            Plan - 02/24/18 1601    Clinical Impression Statement  A: Continued with shoulder strengthening using red band in supine, green band and 2# weights in stanidng. Pt struggles with achieving ROM greater than 90 degrees in standing during flexion and abduction. Pt continues to have popping with abduction. Verbal cuing for form and technique during session.      Plan  P: Add lateral and diagonal wall slides with red band       Patient will benefit from skilled therapeutic intervention in order to improve the following deficits and impairments:  Decreased strength, Decreased range of motion, Pain, Impaired UE functional use, Increased fascial restrictions  Visit Diagnosis: Other symptoms and signs involving the musculoskeletal system  Stiffness of left shoulder, not elsewhere classified  Acute pain of left shoulder    Problem List There are no active problems to display for this patient.  Guadelupe Sabin, OTR/L  [phone] 02/24/2018, 4:03 PM  [facility] [address] [place], [place], [address] Phone: [phone]   Fax:  [phone]  Name: Derrick Clark MRN: [id number] Date of Birth: Jul 19, 1961

## 2018-02-26 ENCOUNTER — Telehealth (HOSPITAL_COMMUNITY): Payer: Self-pay | Admitting: *Deleted

## 2018-02-26 ENCOUNTER — Ambulatory Visit (HOSPITAL_COMMUNITY): Payer: BLUE CROSS/BLUE SHIELD | Admitting: Occupational Therapy

## 2018-02-26 NOTE — Telephone Encounter (Signed)
02/26/18  caller said to cx because he is working out of town and doesn't think he will make it back in time for therapy

## 2018-03-02 ENCOUNTER — Encounter (HOSPITAL_COMMUNITY): Payer: Self-pay

## 2018-03-02 ENCOUNTER — Ambulatory Visit (HOSPITAL_COMMUNITY): Payer: BLUE CROSS/BLUE SHIELD | Attending: Orthopedic Surgery

## 2018-03-02 ENCOUNTER — Other Ambulatory Visit: Payer: Self-pay

## 2018-03-02 DIAGNOSIS — M25512 Pain in left shoulder: Secondary | ICD-10-CM

## 2018-03-02 DIAGNOSIS — M25612 Stiffness of left shoulder, not elsewhere classified: Secondary | ICD-10-CM | POA: Diagnosis not present

## 2018-03-02 DIAGNOSIS — R29898 Other symptoms and signs involving the musculoskeletal system: Secondary | ICD-10-CM | POA: Diagnosis not present

## 2018-03-02 NOTE — Therapy (Signed)
[place], [place], [address] Phone: [phone]   Fax:  [phone]  Occupational Therapy Treatment And reassessment Patient Details  Name: Derrick Clark MRN: [id number] Date of Birth: May 19, 1961 Referring Provider: Remo Lipps Case, MD   Encounter Date: 03/02/2018  OT End of Session - 03/02/18 1505    Visit Number  19    Number of Visits  24    Date for OT Re-Evaluation  04/01/18    Authorization Type  [employer] other     Authorization Time Period  30 visits combined OT/chiropractor 0 used. Benefit period: calendar year    Authorization - Visit Number  13    Authorization - Number of Visits  30    OT Start Time  0370    OT Stop Time  1520    OT Time Calculation (min)  44 min    Activity Tolerance  Patient tolerated treatment well    Behavior During Therapy  WFL for tasks assessed/performed       Past Medical History:  Diagnosis Date  . Diabetes mellitus without complication ([place])     History reviewed. No pertinent surgical history.  There were no vitals filed for this visit.  Subjective Assessment - 03/02/18 1527    Subjective   S: It pops when it gets up there. (90 degrees shoulder flexion and abduction)    Currently in Pain?  Yes    Pain Score  4     Pain Location  Shoulder    Pain Orientation  Left    Pain Descriptors / Indicators  Sore    Pain Type  Acute pain         [facility] OT Assessment - 03/02/18 1437      Assessment   Medical Diagnosis  Left RCR      Precautions   Precautions  Shoulder    Type of Shoulder Precautions  See protocol from Dr. Remo Lipps Case      Observation/Other Assessments   Focus on Therapeutic Outcomes (FOTO)   62/100      ROM / Strength   AROM / PROM / Strength  AROM;Strength      AROM   Overall AROM Comments  Assessed standing. IR/er adducted.    AROM Assessment Site  Shoulder    Right/Left Shoulder  Left    Left Shoulder Flexion  156 Degrees previous: 150    Left  Shoulder ABduction  134 Degrees previous: 131    Left Shoulder Internal Rotation  90 Degrees previous: same    Left Shoulder External Rotation  50 Degrees previous: 55      PROM   Overall PROM   Within functional limits for tasks performed      Strength   Overall Strength Comments  Assessed standing. IR/er adducted    Strength Assessment Site  Shoulder    Right/Left Shoulder  Left    Left Shoulder Flexion  3+/5 previous: 3/5    Left Shoulder ABduction  3+/5 previous; 3+/5    Left Shoulder Internal Rotation  5/5 previous: same    Left Shoulder External Rotation  4-/5 previous: 3/5               OT Treatments/Exercises (OP) - 03/02/18 1447      Exercises   Exercises  Shoulder      Shoulder Exercises: Supine   Protraction  PROM;5 reps    Horizontal ABduction  PROM;5 reps    External Rotation  PROM;5  reps    Internal Rotation  PROM;5 reps    Flexion  PROM;5 reps    ABduction  PROM;5 reps      Shoulder Exercises: Sidelying   External Rotation  Strengthening;10 reps    External Rotation Weight (lbs)  2    Internal Rotation  Strengthening;10 reps    Internal Rotation Weight (lbs)  2    Flexion  Strengthening;10 reps    Flexion Weight (lbs)  2    ABduction  Strengthening;10 reps    ABduction Weight (lbs)  2    Other Sidelying Exercises  Protraction 10X 2#    Other Sidelying Exercises  Horizontal abduction 10X 2#      Shoulder Exercises: Standing   External Rotation  Theraband;10 reps    Theraband Level (Shoulder External Rotation)  Level 2 (Red)    Flexion  Theraband;10 reps    Theraband Level (Shoulder Flexion)  Level 2 (Red)    ABduction  Theraband;10 reps    Theraband Level (Shoulder ABduction)  Level 2 (Red)    Extension  Theraband;10 reps    Theraband Level (Shoulder Extension)  Level 2 (Red)    Retraction  Theraband;10 reps    Theraband Level (Shoulder Retraction)  Level 2 (Red)             OT Education - 03/02/18 1536    Education provided  Yes     Education Details  shoulder theraband strengthening for abduction, horizontal abduction, and flexion.     Person(s) Educated  Patient    Methods  Explanation;Handout;Verbal cues;Demonstration    Comprehension  Returned demonstration;Verbalized understanding       OT Short Term Goals - 03/02/18 1457      OT SHORT TERM GOAL #1   Title  Patient will be educated and independent with HEP to increase functional use of LUE during daily tasks.     Time  6    Period  Weeks      OT SHORT TERM GOAL #2   Title  patient will increase P/ROM of LUE to [facility] in order to increase ability to complete dressing tasks.    Time  6    Period  Weeks      OT SHORT TERM GOAL #3   Title  Patient will increase LUE strength to 3+/5 to increase ability to complete waist level activities.     Time  6    Period  Weeks    Status  Achieved      OT SHORT TERM GOAL #4   Title  Patient will decrease pain level in LUE during daily tasks to 3/10 or less.     Time  6    Period  Weeks    Status  Partially Met      OT SHORT TERM GOAL #5   Title  Patient will decrease fascial restrictions in LUE to min amount in order to increase functional mobility needed for reaching tasks.     Time  6    Period  Weeks        OT Long Term Goals - 03/02/18 1458      OT LONG TERM GOAL #1   Title  Patient will return to highest level of independence using his LUE for all daily tasks.     Time  12    Period  Weeks    Status  On-going      OT LONG TERM GOAL #2   Title  Patient will increase A/ROM  to WNL to increase ability to complete overhead reaching tasks.     Time  12    Period  Weeks    Status  On-going      OT LONG TERM GOAL #3   Title  Patient will increase LUE strength to 4+/5 to increase ability to return to work related tasks.     Time  12    Period  Weeks    Status  On-going      OT LONG TERM GOAL #4   Title  Patient will decrease pain level to 2/10 or less when completing daily tasks.     Time  12     Period  Weeks    Status  On-going      OT LONG TERM GOAL #5   Title  Patient to decrease fascial restrictions in LUE to trace amount in order to increase functional mobility needed to return to reaching tasks.     Time  12    Period  Weeks    Status  On-going            Plan - 03/02/18 1539    Clinical Impression Statement  A: Reassessment completed today. Pt. has met 4/5 STGs with the fifth goal partially met. He hs not met any long term goals at this point although is making slow, steady progress towards meeting them. Pt.'s biggest deficit is his strength. Pt. states he has difficulty reaching above shoulder height and is unable to raise any type of weight about shoulder level. OT reccomends pt. continue therapy to focus on mainly strength. Discussed with pt. continuing updated HEP and desire to continue therapy. Pt. expressed desire to continue. therapy to continue increasing strength.     OT Frequency  2x / week    OT Duration  4 weeks    Plan  P: Continue therapy services to work on deficits mentioned above. Add lateral and diagonal wall slides with red band.    Consulted and Agree with Plan of Care  Patient       Patient will benefit from skilled therapeutic intervention in order to improve the following deficits and impairments:  Decreased strength, Decreased range of motion, Pain, Impaired UE functional use, Increased fascial restrictions  Visit Diagnosis: Other symptoms and signs involving the musculoskeletal system - Plan: Ot plan of care cert/re-cert  Stiffness of left shoulder, not elsewhere classified - Plan: Ot plan of care cert/re-cert  Acute pain of left shoulder - Plan: Ot plan of care cert/re-cert    Problem List There are no active problems to display for this patient.   Ailene Ravel, OTR/L,CBIS  [phone]  03/02/2018, 3:54 PM  [facility] [address] [place], [place], [address] Phone: [phone]    Fax:  [phone]  Name: Derrick Clark MRN: [id number] Date of Birth: 1961/05/17

## 2018-03-02 NOTE — Patient Instructions (Signed)
Complete 10-15 repetitions. 1 set. 2 times a day.  ELASTIC BAND SHOULDER FLEXION  While holding an elastic band at your side, draw up your arm up in front of you keeping your elbow straight.   ELASTIC BAND SHOULDER ABDUCTION  While holding an elastic band at your side, draw up your arm to the side keeping your elbow straight.      ELASTIC BAND - HORIZONTAL ABDUCTION  Start by holding an elastic band or tubing with your arm out-stretched in front of you and across your body towards the opposite side.  Next, pull the elastic band or cord horizontally and outward as shown.   Your elbow should be straight or slightly bent the entire time.

## 2018-03-05 ENCOUNTER — Ambulatory Visit (HOSPITAL_COMMUNITY): Payer: BLUE CROSS/BLUE SHIELD | Admitting: Occupational Therapy

## 2018-03-05 ENCOUNTER — Encounter (HOSPITAL_COMMUNITY): Payer: Self-pay | Admitting: Occupational Therapy

## 2018-03-05 DIAGNOSIS — M25512 Pain in left shoulder: Secondary | ICD-10-CM

## 2018-03-05 DIAGNOSIS — M25612 Stiffness of left shoulder, not elsewhere classified: Secondary | ICD-10-CM | POA: Diagnosis not present

## 2018-03-05 DIAGNOSIS — R29898 Other symptoms and signs involving the musculoskeletal system: Secondary | ICD-10-CM

## 2018-03-05 NOTE — Therapy (Signed)
[facility] [place], [place], [address] Phone: [phone]   Fax:  [phone]  Occupational Therapy Treatment  Patient Details  Name: Derrick Clark MRN: [id number] Date of Birth: March 25, 1961 Referring Provider: Remo Lipps Case, MD   Encounter Date: 03/05/2018  OT End of Session - 03/05/18 1736    Visit Number  20    Number of Visits  24    Date for OT Re-Evaluation  04/01/18    Authorization Type  [employer] other     Authorization Time Period  30 visits combined OT/chiropractor 0 used. Benefit period: calendar year    Authorization - Visit Number  14    Authorization - Number of Visits  30    OT Start Time  1324    OT Stop Time  1730    OT Time Calculation (min)  43 min    Activity Tolerance  Patient tolerated treatment well    Behavior During Therapy  WFL for tasks assessed/performed       Past Medical History:  Diagnosis Date  . Diabetes mellitus without complication ([place])     History reviewed. No pertinent surgical history.  There were no vitals filed for this visit.  Subjective Assessment - 03/05/18 1646    Subjective   S: The right arm is worse today than the left.     Currently in Pain?  Yes    Pain Score  2     Pain Location  Shoulder    Pain Orientation  Left    Pain Descriptors / Indicators  Sore    Pain Type  Acute pain    Pain Radiating Towards  n/a    Pain Onset  1 to 4 weeks ago    Pain Frequency  Constant    Aggravating Factors   weather, new exercises    Pain Relieving Factors  rest, sleep, pain medication, heat    Effect of Pain on Daily Activities  mod effect    Multiple Pain Sites  No         [facility] OT Assessment - 03/05/18 1646      Assessment   Medical Diagnosis  Left RCR      Precautions   Precautions  Shoulder    Type of Shoulder Precautions  See protocol from Dr. Remo Lipps Case               OT Treatments/Exercises (OP) - 03/05/18 1650      Exercises   Exercises  Shoulder       Shoulder Exercises: Supine   Protraction  PROM;5 reps    Horizontal ABduction  PROM;5 reps    External Rotation  PROM;5 reps    Internal Rotation  PROM;5 reps    Flexion  PROM;5 reps    ABduction  PROM;5 reps      Shoulder Exercises: Sidelying   External Rotation  Strengthening;12 reps    External Rotation Weight (lbs)  2    Internal Rotation  Strengthening;12 reps    Internal Rotation Weight (lbs)  2    Flexion  Strengthening;12 reps    Flexion Weight (lbs)  2    ABduction  Strengthening;12 reps    ABduction Weight (lbs)  2    Other Sidelying Exercises  Protraction 12X 2#    Other Sidelying Exercises  Horizontal abduction 12X 2#      Shoulder Exercises: Standing   Protraction  Strengthening;12 reps    Protraction Weight (lbs)  2  Horizontal ABduction  Theraband;12 reps    Theraband Level (Shoulder Horizontal ABduction)  Level 3 (Green)    External Rotation  Theraband;12 reps    Theraband Level (Shoulder External Rotation)  Level 3 (Green)    Flexion  Strengthening;10 reps;Theraband;12 reps    Theraband Level (Shoulder Flexion)  Level 3 (Green)    Shoulder Flexion Weight (lbs)  2    ABduction  Strengthening;10 reps    Shoulder ABduction Weight (lbs)  2    Other Standing Exercises  [place] A/ROM, H1, H2, H4, 10X. Facing the wall.      Shoulder Exercises: ROM/Strengthening   UBE (Upper Arm Bike)  Level 3 3' forward 3' reverse    Ball on Wall  1' flexion 1' abduction    Other ROM/Strengthening Exercises  green therapy ball: chest press, overhead press, wood chops right to left 10X; unable to complete wood chops left to right due to painful popping    Other ROM/Strengthening Exercises  horizontal abduction with serratus anterior, lateral wall walks, diagonal wall walks, 10X each      Functional Reaching Activities   High Level  Pt placed and removed 10 cones from top shelf of cabinet in flexion. Attempted to place cones on middle shelf with abduction, unable to place  more than five due to painful popping/grinding in shoulder.                OT Short Term Goals - 03/02/18 1457      OT SHORT TERM GOAL #1   Title  Patient will be educated and independent with HEP to increase functional use of LUE during daily tasks.     Time  6    Period  Weeks      OT SHORT TERM GOAL #2   Title  patient will increase P/ROM of LUE to [facility] in order to increase ability to complete dressing tasks.    Time  6    Period  Weeks      OT SHORT TERM GOAL #3   Title  Patient will increase LUE strength to 3+/5 to increase ability to complete waist level activities.     Time  6    Period  Weeks    Status  Achieved      OT SHORT TERM GOAL #4   Title  Patient will decrease pain level in LUE during daily tasks to 3/10 or less.     Time  6    Period  Weeks    Status  Partially Met      OT SHORT TERM GOAL #5   Title  Patient will decrease fascial restrictions in LUE to min amount in order to increase functional mobility needed for reaching tasks.     Time  6    Period  Weeks        OT Long Term Goals - 03/02/18 1458      OT LONG TERM GOAL #1   Title  Patient will return to highest level of independence using his LUE for all daily tasks.     Time  12    Period  Weeks    Status  On-going      OT LONG TERM GOAL #2   Title  Patient will increase A/ROM to WNL to increase ability to complete overhead reaching tasks.     Time  12    Period  Weeks    Status  On-going      OT LONG TERM GOAL #3  Title  Patient will increase LUE strength to 4+/5 to increase ability to return to work related tasks.     Time  12    Period  Weeks    Status  On-going      OT LONG TERM GOAL #4   Title  Patient will decrease pain level to 2/10 or less when completing daily tasks.     Time  12    Period  Weeks    Status  On-going      OT LONG TERM GOAL #5   Title  Patient to decrease fascial restrictions in LUE to trace amount in order to increase functional mobility needed to  return to reaching tasks.     Time  12    Period  Weeks    Status  On-going            Plan - 03/05/18 1736    Clinical Impression Statement  A: Focused on strengthening during session, adding lateral and diagonal wall slides, green therapy ball exercises, and functional reaching task. Pt has painful popping/grinding with overhead reaching moreso with abduction than flexion. Verbal cuing for form and technique during exercises, rest breaks as needed for fatigue.     Plan  P: Continue with strengthening and functional reaching, attempting to find tolerate plane for abduction exercises.        Patient will benefit from skilled therapeutic intervention in order to improve the following deficits and impairments:  Decreased strength, Decreased range of motion, Pain, Impaired UE functional use, Increased fascial restrictions  Visit Diagnosis: Other symptoms and signs involving the musculoskeletal system  Stiffness of left shoulder, not elsewhere classified  Acute pain of left shoulder    Problem List There are no active problems to display for this patient.   Guadelupe Sabin, OTR/L  [phone] 03/05/2018, 5:39 PM  [facility] [address] [place], [place], [address] Phone: [phone]   Fax:  [phone]  Name: Derrick Clark MRN: [id number] Date of Birth: 1961-07-21

## 2018-03-08 ENCOUNTER — Other Ambulatory Visit: Payer: Self-pay

## 2018-03-08 ENCOUNTER — Ambulatory Visit (HOSPITAL_COMMUNITY): Payer: BLUE CROSS/BLUE SHIELD

## 2018-03-08 ENCOUNTER — Encounter (HOSPITAL_COMMUNITY): Payer: Self-pay

## 2018-03-08 DIAGNOSIS — R29898 Other symptoms and signs involving the musculoskeletal system: Secondary | ICD-10-CM | POA: Diagnosis not present

## 2018-03-08 DIAGNOSIS — M25612 Stiffness of left shoulder, not elsewhere classified: Secondary | ICD-10-CM | POA: Diagnosis not present

## 2018-03-08 DIAGNOSIS — M25512 Pain in left shoulder: Secondary | ICD-10-CM | POA: Diagnosis not present

## 2018-03-09 NOTE — Therapy (Signed)
[facility] [place], [place], [address] Phone: [phone]   Fax:  [phone]  Occupational Therapy Treatment  Patient Details  Name: Derrick Clark MRN: [id number] Date of Birth: 07/28/1961 Referring Provider: Remo Lipps Case, MD   Encounter Date: 03/08/2018  OT End of Session - 03/09/18 0807    Visit Number  21    Number of Visits  24    Date for OT Re-Evaluation  04/01/18    Authorization Type  [employer] other     Authorization Time Period  30 visits combined OT/chiropractor 0 used. Benefit period: calendar year    Authorization - Visit Number  15    Authorization - Number of Visits  30    OT Start Time  7588    OT Stop Time  1730    OT Time Calculation (min)  40 min    Activity Tolerance  Patient tolerated treatment well    Behavior During Therapy  WFL for tasks assessed/performed       Past Medical History:  Diagnosis Date  . Diabetes mellitus without complication ([place])     History reviewed. No pertinent surgical history.  There were no vitals filed for this visit.  Subjective Assessment - 03/08/18 1651    Subjective   S: It was popping and grinding really bad last time. I hope it's not messed up.    Currently in Pain?  Yes    Pain Score  2     Pain Location  Shoulder    Pain Orientation  Left    Pain Descriptors / Indicators  Sore    Pain Type  Acute pain         [facility] OT Assessment - 03/08/18 0805      Assessment   Medical Diagnosis  Left RCR      Precautions   Precautions  Shoulder    Type of Shoulder Precautions  See protocol from Dr. Remo Lipps Case               OT Treatments/Exercises (OP) - 03/08/18 1702      Exercises   Exercises  Shoulder      Shoulder Exercises: Supine   Protraction  PROM;5 reps    Horizontal ABduction  PROM;5 reps    External Rotation  PROM;5 reps    Internal Rotation  PROM;5 reps    Flexion  PROM;5 reps    ABduction  PROM;5 reps      Shoulder Exercises:  Sidelying   External Rotation  Strengthening;12 reps    External Rotation Weight (lbs)  3    Internal Rotation  Strengthening;12 reps    Internal Rotation Weight (lbs)  3    Flexion  Strengthening;12 reps    Flexion Weight (lbs)  3    ABduction  Strengthening;12 reps    ABduction Weight (lbs)  3    Other Sidelying Exercises  Protraction 12X 3#    Other Sidelying Exercises  Horizontal abduction 12X 3#      Shoulder Exercises: Standing   Protraction  Strengthening;10 reps    Protraction Weight (lbs)  3    Horizontal ABduction  Strengthening;10 reps    Horizontal ABduction Weight (lbs)  3    External Rotation  Strengthening;10 reps    External Rotation Weight (lbs)  3    Internal Rotation  Strengthening;10 reps    Internal Rotation Weight (lbs)  3    Flexion  Strengthening;10 reps    Shoulder Flexion Weight (  lbs)  3    ABduction  Strengthening;10 reps    Shoulder ABduction Weight (lbs)  3      Shoulder Exercises: ROM/Strengthening   UBE (Upper Arm Bike)  Level 4 2' reverse 2' forward  pace: 4.5-5.5    Cybex Press  15 reps 5 plate    Cybex Row  15 reps 5 plate    Over Head Lace  2'    Ball on Wall  1' flexion 1' abduction green ball      Manual Therapy   Manual Therapy  Myofascial release    Manual therapy comments  Manual therapy completed prior to exercises    Myofascial Release  Myofascial release and manual stretching completed to left upper arm, trapezius, and scapularis region to decrease fascial restrictions and increase joint mobility in a pain free zone.                OT Short Term Goals - 03/02/18 1457      OT SHORT TERM GOAL #1   Title  Patient will be educated and independent with HEP to increase functional use of LUE during daily tasks.     Time  6    Period  Weeks      OT SHORT TERM GOAL #2   Title  patient will increase P/ROM of LUE to [facility] in order to increase ability to complete dressing tasks.    Time  6    Period  Weeks      OT SHORT TERM  GOAL #3   Title  Patient will increase LUE strength to 3+/5 to increase ability to complete waist level activities.     Time  6    Period  Weeks    Status  Achieved      OT SHORT TERM GOAL #4   Title  Patient will decrease pain level in LUE during daily tasks to 3/10 or less.     Time  6    Period  Weeks    Status  Partially Met      OT SHORT TERM GOAL #5   Title  Patient will decrease fascial restrictions in LUE to min amount in order to increase functional mobility needed for reaching tasks.     Time  6    Period  Weeks        OT Long Term Goals - 03/02/18 1458      OT LONG TERM GOAL #1   Title  Patient will return to highest level of independence using his LUE for all daily tasks.     Time  12    Period  Weeks    Status  On-going      OT LONG TERM GOAL #2   Title  Patient will increase A/ROM to WNL to increase ability to complete overhead reaching tasks.     Time  12    Period  Weeks    Status  On-going      OT LONG TERM GOAL #3   Title  Patient will increase LUE strength to 4+/5 to increase ability to return to work related tasks.     Time  12    Period  Weeks    Status  On-going      OT LONG TERM GOAL #4   Title  Patient will decrease pain level to 2/10 or less when completing daily tasks.     Time  12    Period  Weeks    Status  On-going      OT LONG TERM GOAL #5   Title  Patient to decrease fascial restrictions in LUE to trace amount in order to increase functional mobility needed to return to reaching tasks.     Time  12    Period  Weeks    Status  On-going            Plan - 03/09/18 0807    Clinical Impression Statement  A: Progressed to 3# sidelying and standing to continue working on scapular and shoulder strengthening. Patient continues to have a popping and grinding feeling when passively and actively completing abduction. This sensation happens when supine, sidelying and standing. Pt was educated to complete ROM only until right before that  sensation was to happen. VC for form and technique.     Plan  P: Follow up on MD appointment (wednesday). Continue with functional reaching and strengthening. Diagonal and lateral wall slides.     Consulted and Agree with Plan of Care  Patient       Patient will benefit from skilled therapeutic intervention in order to improve the following deficits and impairments:     Visit Diagnosis: Other symptoms and signs involving the musculoskeletal system  Stiffness of left shoulder, not elsewhere classified  Acute pain of left shoulder    Problem List There are no active problems to display for this patient.  Ailene Ravel, OTR/L,CBIS  [phone]  03/09/2018, 8:10 AM  [facility], [place], [address] Phone: [phone]   Fax:  [phone]  Name: Derrick Clark MRN: [id number] Date of Birth: 1961/02/20

## 2018-03-10 ENCOUNTER — Telehealth (HOSPITAL_COMMUNITY): Payer: Self-pay

## 2018-03-10 ENCOUNTER — Ambulatory Visit (HOSPITAL_COMMUNITY): Payer: BLUE CROSS/BLUE SHIELD

## 2018-03-10 DIAGNOSIS — Z6841 Body Mass Index (BMI) 40.0 and over, adult: Secondary | ICD-10-CM | POA: Diagnosis not present

## 2018-03-10 DIAGNOSIS — Z9889 Other specified postprocedural states: Secondary | ICD-10-CM | POA: Diagnosis not present

## 2018-03-10 DIAGNOSIS — M7542 Impingement syndrome of left shoulder: Secondary | ICD-10-CM | POA: Diagnosis not present

## 2018-03-10 DIAGNOSIS — M25512 Pain in left shoulder: Secondary | ICD-10-CM | POA: Diagnosis not present

## 2018-03-10 NOTE — Telephone Encounter (Signed)
Patient reports he saw the MD today and MD said not to do no more OT-requested to be d/c

## 2018-03-15 ENCOUNTER — Encounter (HOSPITAL_COMMUNITY): Payer: BLUE CROSS/BLUE SHIELD

## 2018-03-17 ENCOUNTER — Encounter (HOSPITAL_COMMUNITY): Payer: BLUE CROSS/BLUE SHIELD

## 2018-03-23 ENCOUNTER — Encounter (HOSPITAL_COMMUNITY): Payer: BLUE CROSS/BLUE SHIELD

## 2018-03-25 ENCOUNTER — Encounter (HOSPITAL_COMMUNITY): Payer: BLUE CROSS/BLUE SHIELD

## 2018-04-01 ENCOUNTER — Encounter (HOSPITAL_COMMUNITY): Payer: Self-pay | Admitting: Occupational Therapy

## 2018-04-01 NOTE — Therapy (Signed)
[facility] [address] [place], [place], [address] Phone: [phone]   Fax:  [phone]  Patient Details  Name: Derrick Clark MRN: [id number] Date of Birth: 03/26/1961 Referring Provider:  No ref. provider found  Encounter Date: 04/01/2018   OCCUPATIONAL THERAPY DISCHARGE SUMMARY  Visits from Start of Care: 21  Current functional level related to goals / functional outcomes: Pt has improved ROM, strength, and functional use during B/ADLs. Pt physician has requested discharge at this time.    Remaining deficits: Continues to have strength deficits above 50% range limiting ability to complete work tasks.    Education / Equipment: HEP for ROM and strengthening Plan: Patient agrees to discharge.  Patient goals were partially met. Patient is being discharged due to the physician's request.  ?????        , OTR/L  [phone] 04/01/2018, 9:22 AM  [facility] [address] [place], [place], [address] Phone: [phone]   Fax:  [phone] 

## 2018-04-15 DIAGNOSIS — M67919 Unspecified disorder of synovium and tendon, unspecified shoulder: Secondary | ICD-10-CM | POA: Diagnosis not present

## 2018-04-15 DIAGNOSIS — Z6841 Body Mass Index (BMI) 40.0 and over, adult: Secondary | ICD-10-CM | POA: Diagnosis not present

## 2018-04-15 DIAGNOSIS — M719 Bursopathy, unspecified: Secondary | ICD-10-CM | POA: Diagnosis not present

## 2018-04-15 DIAGNOSIS — M25511 Pain in right shoulder: Secondary | ICD-10-CM | POA: Diagnosis not present

## 2018-04-22 DIAGNOSIS — E1165 Type 2 diabetes mellitus with hyperglycemia: Secondary | ICD-10-CM | POA: Diagnosis not present

## 2018-04-22 DIAGNOSIS — Z720 Tobacco use: Secondary | ICD-10-CM | POA: Diagnosis not present

## 2018-04-22 DIAGNOSIS — E782 Mixed hyperlipidemia: Secondary | ICD-10-CM | POA: Diagnosis not present

## 2018-04-22 DIAGNOSIS — E1142 Type 2 diabetes mellitus with diabetic polyneuropathy: Secondary | ICD-10-CM | POA: Diagnosis not present

## 2018-04-22 DIAGNOSIS — Z9189 Other specified personal risk factors, not elsewhere classified: Secondary | ICD-10-CM | POA: Diagnosis not present

## 2018-04-22 DIAGNOSIS — F331 Major depressive disorder, recurrent, moderate: Secondary | ICD-10-CM | POA: Diagnosis not present

## 2018-04-26 DIAGNOSIS — E1165 Type 2 diabetes mellitus with hyperglycemia: Secondary | ICD-10-CM | POA: Diagnosis not present

## 2018-04-26 DIAGNOSIS — E1142 Type 2 diabetes mellitus with diabetic polyneuropathy: Secondary | ICD-10-CM | POA: Diagnosis not present

## 2018-04-26 DIAGNOSIS — Z23 Encounter for immunization: Secondary | ICD-10-CM | POA: Diagnosis not present

## 2018-04-26 DIAGNOSIS — Z1331 Encounter for screening for depression: Secondary | ICD-10-CM | POA: Diagnosis not present

## 2018-04-26 DIAGNOSIS — F17210 Nicotine dependence, cigarettes, uncomplicated: Secondary | ICD-10-CM | POA: Diagnosis not present

## 2018-04-26 DIAGNOSIS — E782 Mixed hyperlipidemia: Secondary | ICD-10-CM | POA: Diagnosis not present

## 2018-04-26 DIAGNOSIS — Z1389 Encounter for screening for other disorder: Secondary | ICD-10-CM | POA: Diagnosis not present

## 2018-05-10 DIAGNOSIS — Z7984 Long term (current) use of oral hypoglycemic drugs: Secondary | ICD-10-CM | POA: Diagnosis not present

## 2018-05-10 DIAGNOSIS — Z800 Family history of malignant neoplasm of digestive organs: Secondary | ICD-10-CM | POA: Diagnosis not present

## 2018-05-10 DIAGNOSIS — Z882 Allergy status to sulfonamides status: Secondary | ICD-10-CM | POA: Diagnosis not present

## 2018-05-10 DIAGNOSIS — Z79899 Other long term (current) drug therapy: Secondary | ICD-10-CM | POA: Diagnosis not present

## 2018-05-10 DIAGNOSIS — E785 Hyperlipidemia, unspecified: Secondary | ICD-10-CM | POA: Diagnosis not present

## 2018-05-10 DIAGNOSIS — Z888 Allergy status to other drugs, medicaments and biological substances status: Secondary | ICD-10-CM | POA: Diagnosis not present

## 2018-05-10 DIAGNOSIS — Z1211 Encounter for screening for malignant neoplasm of colon: Secondary | ICD-10-CM | POA: Diagnosis not present

## 2018-05-10 DIAGNOSIS — F17200 Nicotine dependence, unspecified, uncomplicated: Secondary | ICD-10-CM | POA: Diagnosis not present

## 2018-05-10 DIAGNOSIS — I10 Essential (primary) hypertension: Secondary | ICD-10-CM | POA: Diagnosis not present

## 2018-05-10 DIAGNOSIS — E119 Type 2 diabetes mellitus without complications: Secondary | ICD-10-CM | POA: Diagnosis not present

## 2018-05-21 DIAGNOSIS — M5430 Sciatica, unspecified side: Secondary | ICD-10-CM | POA: Insufficient documentation

## 2018-06-02 DIAGNOSIS — M25511 Pain in right shoulder: Secondary | ICD-10-CM | POA: Diagnosis not present

## 2018-06-02 DIAGNOSIS — M7502 Adhesive capsulitis of left shoulder: Secondary | ICD-10-CM | POA: Insufficient documentation

## 2018-06-02 DIAGNOSIS — Z6841 Body Mass Index (BMI) 40.0 and over, adult: Secondary | ICD-10-CM | POA: Diagnosis not present

## 2018-06-03 DIAGNOSIS — M545 Low back pain: Secondary | ICD-10-CM | POA: Diagnosis not present

## 2018-06-03 DIAGNOSIS — M48061 Spinal stenosis, lumbar region without neurogenic claudication: Secondary | ICD-10-CM | POA: Diagnosis not present

## 2018-06-03 DIAGNOSIS — M5136 Other intervertebral disc degeneration, lumbar region: Secondary | ICD-10-CM | POA: Diagnosis not present

## 2018-06-03 DIAGNOSIS — M4807 Spinal stenosis, lumbosacral region: Secondary | ICD-10-CM | POA: Diagnosis not present

## 2018-06-03 DIAGNOSIS — E278 Other specified disorders of adrenal gland: Secondary | ICD-10-CM | POA: Diagnosis not present

## 2018-06-07 DIAGNOSIS — E279 Disorder of adrenal gland, unspecified: Secondary | ICD-10-CM | POA: Insufficient documentation

## 2018-06-24 DIAGNOSIS — Z6841 Body Mass Index (BMI) 40.0 and over, adult: Secondary | ICD-10-CM | POA: Diagnosis not present

## 2018-06-24 DIAGNOSIS — M545 Low back pain: Secondary | ICD-10-CM | POA: Diagnosis not present

## 2018-06-29 DIAGNOSIS — K8020 Calculus of gallbladder without cholecystitis without obstruction: Secondary | ICD-10-CM | POA: Diagnosis not present

## 2018-06-29 DIAGNOSIS — E279 Disorder of adrenal gland, unspecified: Secondary | ICD-10-CM | POA: Diagnosis not present

## 2018-06-29 DIAGNOSIS — K760 Fatty (change of) liver, not elsewhere classified: Secondary | ICD-10-CM | POA: Diagnosis not present

## 2018-06-29 DIAGNOSIS — D1779 Benign lipomatous neoplasm of other sites: Secondary | ICD-10-CM | POA: Diagnosis not present

## 2018-08-06 DIAGNOSIS — Z6841 Body Mass Index (BMI) 40.0 and over, adult: Secondary | ICD-10-CM | POA: Diagnosis not present

## 2018-08-06 DIAGNOSIS — M47816 Spondylosis without myelopathy or radiculopathy, lumbar region: Secondary | ICD-10-CM | POA: Diagnosis not present

## 2018-08-06 DIAGNOSIS — M5126 Other intervertebral disc displacement, lumbar region: Secondary | ICD-10-CM | POA: Diagnosis not present

## 2018-08-23 DIAGNOSIS — Z23 Encounter for immunization: Secondary | ICD-10-CM | POA: Diagnosis not present

## 2018-08-23 DIAGNOSIS — E782 Mixed hyperlipidemia: Secondary | ICD-10-CM | POA: Diagnosis not present

## 2018-08-23 DIAGNOSIS — E1165 Type 2 diabetes mellitus with hyperglycemia: Secondary | ICD-10-CM | POA: Diagnosis not present

## 2018-08-23 DIAGNOSIS — E8881 Metabolic syndrome: Secondary | ICD-10-CM | POA: Diagnosis not present

## 2018-08-24 DIAGNOSIS — M5136 Other intervertebral disc degeneration, lumbar region: Secondary | ICD-10-CM | POA: Diagnosis not present

## 2018-08-24 DIAGNOSIS — M47816 Spondylosis without myelopathy or radiculopathy, lumbar region: Secondary | ICD-10-CM | POA: Diagnosis not present

## 2018-09-02 DIAGNOSIS — M719 Bursopathy, unspecified: Secondary | ICD-10-CM | POA: Diagnosis not present

## 2018-09-02 DIAGNOSIS — M67919 Unspecified disorder of synovium and tendon, unspecified shoulder: Secondary | ICD-10-CM | POA: Diagnosis not present

## 2018-09-02 DIAGNOSIS — Z6841 Body Mass Index (BMI) 40.0 and over, adult: Secondary | ICD-10-CM | POA: Diagnosis not present

## 2018-09-16 DIAGNOSIS — E119 Type 2 diabetes mellitus without complications: Secondary | ICD-10-CM | POA: Diagnosis not present

## 2018-10-01 DIAGNOSIS — Z6841 Body Mass Index (BMI) 40.0 and over, adult: Secondary | ICD-10-CM | POA: Diagnosis not present

## 2018-10-01 DIAGNOSIS — M5126 Other intervertebral disc displacement, lumbar region: Secondary | ICD-10-CM | POA: Diagnosis not present

## 2018-10-01 DIAGNOSIS — M47816 Spondylosis without myelopathy or radiculopathy, lumbar region: Secondary | ICD-10-CM | POA: Diagnosis not present

## 2018-10-13 ENCOUNTER — Other Ambulatory Visit: Payer: Self-pay

## 2018-10-13 ENCOUNTER — Encounter (HOSPITAL_COMMUNITY): Payer: Self-pay | Admitting: Emergency Medicine

## 2018-10-13 ENCOUNTER — Emergency Department (HOSPITAL_COMMUNITY)
Admission: EM | Admit: 2018-10-13 | Discharge: 2018-10-13 | Disposition: A | Discharge status: Home / Self Care | Payer: BLUE CROSS/BLUE SHIELD | Attending: Emergency Medicine | Admitting: Emergency Medicine

## 2018-10-13 ENCOUNTER — Emergency Department (HOSPITAL_COMMUNITY): Payer: BLUE CROSS/BLUE SHIELD

## 2018-10-13 DIAGNOSIS — M25571 Pain in right ankle and joints of right foot: Secondary | ICD-10-CM | POA: Insufficient documentation

## 2018-10-13 DIAGNOSIS — F17210 Nicotine dependence, cigarettes, uncomplicated: Secondary | ICD-10-CM | POA: Diagnosis not present

## 2018-10-13 DIAGNOSIS — Z79899 Other long term (current) drug therapy: Secondary | ICD-10-CM | POA: Insufficient documentation

## 2018-10-13 DIAGNOSIS — E119 Type 2 diabetes mellitus without complications: Secondary | ICD-10-CM | POA: Diagnosis not present

## 2018-10-13 DIAGNOSIS — M25561 Pain in right knee: Secondary | ICD-10-CM | POA: Insufficient documentation

## 2018-10-13 DIAGNOSIS — S79911A Unspecified injury of right hip, initial encounter: Secondary | ICD-10-CM | POA: Diagnosis not present

## 2018-10-13 DIAGNOSIS — S8991XA Unspecified injury of right lower leg, initial encounter: Secondary | ICD-10-CM | POA: Diagnosis not present

## 2018-10-13 DIAGNOSIS — M7989 Other specified soft tissue disorders: Secondary | ICD-10-CM | POA: Diagnosis not present

## 2018-10-13 DIAGNOSIS — W19XXXA Unspecified fall, initial encounter: Secondary | ICD-10-CM

## 2018-10-13 DIAGNOSIS — W010XXA Fall on same level from slipping, tripping and stumbling without subsequent striking against object, initial encounter: Secondary | ICD-10-CM | POA: Insufficient documentation

## 2018-10-13 DIAGNOSIS — S99911A Unspecified injury of right ankle, initial encounter: Secondary | ICD-10-CM | POA: Diagnosis not present

## 2018-10-13 DIAGNOSIS — Z7984 Long term (current) use of oral hypoglycemic drugs: Secondary | ICD-10-CM | POA: Diagnosis not present

## 2018-10-13 MED ORDER — NAPROXEN 500 MG PO TABS: 500.0000 mg | ORAL_TABLET | Freq: Two times a day (BID) | ORAL | 0 refills | Status: DC

## 2018-10-13 MED ORDER — HYDROCODONE-ACETAMINOPHEN 5-325 MG PO TABS
1.0000 | ORAL_TABLET | Freq: Once | ORAL | Status: AC
Start: 2018-10-13 — End: 2018-10-13
  Administered 2018-10-13: 1 via ORAL
  Filled 2018-10-13: qty 1

## 2018-10-13 NOTE — ED Provider Notes (Signed)
[facility] EMERGENCY DEPARTMENT Provider Note   CSN: [id number] Arrival date & time: 10/13/18  1651     History   Chief Complaint Chief Complaint  Patient presents with  . Fall    HPI Derrick Clark is a 57 y.o. male with a past medical history significant for diabetes, lumbar spondylosis who presents for evaluation after mechanical fall.  Per patient [place] [employer] [place] at approximately 10 AM when he walked into the store and slipped on a puddle of water.  Per patient they were mopping the floors at the time does not have a sign up.  Patient states his right leg went behind him and his left leg went in front of him "in a split."  Patient states he felt pain to his right hip, right knee and right ankle at the time.  Patient states he went home and took [place] powders for his pain.  Pain was only mildly relieved with these measures.  Patient states he has had increased pain since incident.  Rates his pain an 8/10.  Pain does not radiate.  Describes his pain as a dull aching.  Denies head trauma or loss of consciousness.  Denies headache, blurred vision, back pain, numbness and tingling his extremities, bowel or bladder incontinence, saddle paresthesia, leg swelling decreased range of motion, weakness.  Has been able to walk however with pain.  HPI  Past Medical History:  Diagnosis Date  . Diabetes mellitus without complication ([place])     There are no active problems to display for this patient.   Past Surgical History:  Procedure Laterality Date  . SHOULDER SURGERY Left         Home Medications    Prior to Admission medications   Medication Sig Start Date End Date Taking? Authorizing Provider  acetaminophen (TYLENOL) 500 MG tablet Take 1,000-1,500 mg by mouth every 6 (six) hours as needed for moderate pain.    [provider]  gabapentin (NEURONTIN) 600 MG tablet Take 600 mg by mouth 3 (three) times daily as needed. 04/17/17   [provider]  glipiZIDE (GLUCOTROL XL) 10 MG 24 hr tablet Take 10 mg by mouth daily.    [provider]  HYDROcodone-acetaminophen (NORCO) 5-325 MG tablet Take 1-2 tablets by mouth every 6 (six) hours as needed for severe pain. 07/24/17   Orlie Dakin, MD  metFORMIN (GLUCOPHAGE) 500 MG tablet Take 1,000 mg by mouth 2 (two) times daily with a meal.    [provider]  naproxen (NAPROSYN) 500 MG tablet Take 1 tablet (500 mg total) by mouth 2 (two) times daily. 10/13/18   Niamh Rada A, PA-C  simvastatin (ZOCOR) 40 MG tablet Take 40 mg by mouth daily.    [provider]    Family History No family history on file.  Social History Social History   Tobacco Use  . Smoking status: Current Every Day Smoker    Packs/day: 0.75    Types: Cigarettes  . Smokeless tobacco: Never Used  Substance Use Topics  . Alcohol use: No    Frequency: Never  . Drug use: No     Allergies   Jardiance [empagliflozin] and Sulfa antibiotics   Review of Systems Review of Systems  Constitutional: Negative for activity change, appetite change, chills, diaphoresis, fatigue, fever and unexpected weight change.  Respiratory: Negative for cough, chest tightness and shortness of breath.   Cardiovascular: Negative for chest pain and leg swelling.  Gastrointestinal: Negative for  nausea and vomiting.  Musculoskeletal: Negative for arthralgias, back pain, joint swelling, myalgias, neck pain and neck stiffness.       Pain to right knee, right hip and right ankle.  Skin: Negative.   Neurological: Negative for dizziness, weakness, light-headedness, numbness and headaches.     Physical Exam Updated Vital Signs BP 126/71 (BP Location: Right Arm)   Pulse 79   Temp 98.2 F (36.8 C) (Oral)   Resp 18   Ht 5\' 10"  (1.778 m)   Wt 134.3 kg   SpO2 96%   BMI 42.47 kg/m   Physical Exam  Musculoskeletal:       Right hip: He exhibits tenderness. He exhibits normal range of motion,  normal strength, no bony tenderness, no swelling, no crepitus, no deformity and no laceration.       Right knee: He exhibits normal range of motion, no swelling, no effusion, no ecchymosis, no deformity, no laceration, no erythema and normal alignment. Tenderness found. Medial joint line tenderness noted. No patellar tendon tenderness noted.       Right ankle: He exhibits no swelling and no ecchymosis. Tenderness. Lateral malleolus and medial malleolus tenderness found.       Legs:   Physical Exam  Constitutional: Pt appears well-developed and well-nourished. No distress.  HENT:  Head: Normocephalic and atraumatic.  Mouth/Throat: Oropharynx is clear and moist. No oropharyngeal exudate.  Eyes: Conjunctivae are normal.  Neck: Normal range of motion. Neck supple.  Full ROM without pain  Cardiovascular: Normal rate, regular rhythm and intact distal pulses.   Pulmonary/Chest: Effort normal and breath sounds normal. No respiratory distress. Pt has no wheezes.  Abdominal: Soft. Pt exhibits no distension. There is no tenderness, rebound or guarding. No abd bruit or pulsatile mass Musculoskeletal:  Full range of motion of the T-spine and L-spine with flexion, hyperextension, and lateral flexion. No midline tenderness or stepoffs. No tenderness to palpation of the spinous processes of the T-spine or L-spine. No tenderness to palpation of the paraspinous muscles of the L-spine. Negative straight leg raise. Mild tenderness palpation over right medial joint line on right knee.  Able to flex and extend without difficulty.  Negative varus stress.  Tenderness with valgus stress. 5/5 strength to bilateral lower extremity.  Able to straight leg raise off bed without difficulty. Palpation over medial and lateral malleolus on the right ankle.  Able to plantarflex and dorsiflex without difficulty.,  However has pain with plantar flexion on the right lower extremity.   Bilateral lower extremities without erythema,  edema or ecchymosis.  Intact sensation to sharp and dull to bilateral lower sensation. Lymphadenopathy:    Pt has no cervical adenopathy.  Neurological: Pt is alert. Pt has normal reflexes.  Reflex Scores:      Bicep reflexes are 2+ on the right side and 2+ on the left side.      Brachioradialis reflexes are 2+ on the right side and 2+ on the left side.      Patellar reflexes are 2+ on the right side and 2+ on the left side.      Achilles reflexes are 2+ on the right side and 2+ on the left side. Speech is clear and goal oriented, follows commands Normal 5/5 strength in upper and lower extremities bilaterally including dorsiflexion and plantar flexion, strong and equal grip strength Sensation normal to light and sharp touch Moves extremities without ataxia, coordination intact Normal gait Normal balance No Clonus Skin: Skin is warm and dry. No rash noted  or lesions noted. Pt is not diaphoretic. No erythema, ecchymosis,edema or warmth.  Bilateral stasis skin changes to lower extremity.  This is chronic in nature according to patient. Psychiatric: Pt has a normal mood and affect. Behavior is normal.  Nursing note and vitals reviewed. ED Treatments / Results  Labs (all labs ordered are listed, but only abnormal results are displayed) Labs Reviewed - No data to display  EKG None  Radiology Dg Ankle Complete Right  Result Date: 10/13/2018 CLINICAL DATA:  Initial evaluation for acute trauma, fall. EXAM: RIGHT ANKLE - COMPLETE 3+ VIEW COMPARISON:  None available. FINDINGS: No acute fracture or dislocation. Ankle mortise approximated. Talar dome intact. Mild soft tissue swelling seen about the ankle, most notable at the lateral malleolus. Prominent posterior plantar calcaneal enthesophytes. Degenerative spurring present at the dorsal midfoot. IMPRESSION: 1. No acute osseous abnormality about the ankle. 2. Mild diffuse soft tissue swelling about the ankle, most notable at the lateral malleolus.  Electronically Signed   By: Jeannine Boga M.D.   On: 10/13/2018 19:12   Dg Knee Complete 4 Views Right  Result Date: 10/13/2018 CLINICAL DATA:  Initial evaluation for acute trauma, fall. EXAM: RIGHT KNEE - COMPLETE 4+ VIEW COMPARISON:  None. FINDINGS: No acute fracture dislocation. No joint effusion. Moderate tricompartmental degenerative osteoarthrosis. No acute soft tissue abnormality. Few vascular calcifications noted. IMPRESSION: 1. No acute osseous abnormality about the right knee. 2. Moderate tricompartmental degenerative osteoarthrosis. Electronically Signed   By: Jeannine Boga M.D.   On: 10/13/2018 19:10   Dg Hip Unilat W Or Wo Pelvis 2-3 Views Right  Result Date: 10/13/2018 CLINICAL DATA:  Initial evaluation for acute trauma, fall. EXAM: DG HIP (WITH OR WITHOUT PELVIS) 2-3V RIGHT COMPARISON:  None. FINDINGS: No acute fracture or dislocation. Femoral heads in normal line within the acetabula. Bony pelvis intact. Moderate osteoarthritic changes about the hips bilaterally. No acute soft tissue abnormality. Degenerative changes noted within lower lumbar spine. IMPRESSION: No acute osseous abnormality about the right hip. Electronically Signed   By: Jeannine Boga M.D.   On: 10/13/2018 19:08    Procedures Procedures (including critical care time)  Medications Ordered in ED Medications  HYDROcodone-acetaminophen (NORCO/VICODIN) 5-325 MG per tablet 1 tablet (1 tablet Oral Given 10/13/18 1713)     Initial Impression / Assessment and Plan / ED Course  I have reviewed the triage vital signs and the nursing notes.  Pertinent labs & imaging results that were available during my care of the patient were reviewed by me and considered in my medical decision making (see chart for details).  57 year old male who appears otherwise well presents for evaluation after mechanical fall.  No head trauma or loss of consciousness.  Pain to right hip, right knee and right ankle.  Has  been able to ambulate however with pain.  Neurovascularly intact.  No obvious deformity of joints.  Will obtain plain film to r/o fracture and dislocation and reevaluate.  1830: She notified this provider that the x-ray machines are down they will not be able to read the x-rays currently.  1920: Plain film without fracture or dislocation.  Mild soft tissue swelling to the lateral malleolus on the right ankle.  Most likely sprain or strain.  Discussed with patient symptomatic management as well as rest, ice and elevation of the affected foot.  Discussed with patient precautions.  Patient was understanding is agreeable for follow-up     Final Clinical Impressions(s) / ED Diagnoses   Final diagnoses:  Fall, initial  encounter  Acute pain of right knee  Acute right ankle pain    ED Discharge Orders         Ordered    naproxen (NAPROSYN) 500 MG tablet  2 times daily     10/13/18 1913           Lowell Mcgurk A, PA-C 10/13/18 Larinda Buttery, MD 10/13/18 2135

## 2018-10-13 NOTE — Discharge Instructions (Addendum)
You were evaluated today after a fall.  X-rays did not show any fracture or dislocation.  Pain is most likely musculoskeletal nature as a sprain or a strain..  I would recommend naproxen as I have prescribed you.  Please take prescribed.  May rest, ice and elevate the affected foot.  Follow-up with your primary care provider if you have unresolved pain.  Return to the ED with any new or worsening symptoms such as: Contact a health care provider if: Your knee pain continues, changes, or gets worse. You have a fever along with knee pain. Your knee buckles or locks up. Your knee swells, and the swelling becomes worse. Get help right away if: Your knee feels warm to the touch. You cannot move your knee. You have severe pain in your knee. You have chest pain. You have trouble breathing.

## 2018-10-13 NOTE — ED Triage Notes (Signed)
Pt states he slipped in fell in [place] this morning. States RT leg went behind him when he fell. C/o pain to RT leg and RT ankle. Reports bruising to RT hip. No LOC, dizziness, or hitting head.

## 2018-10-16 DIAGNOSIS — M25551 Pain in right hip: Secondary | ICD-10-CM | POA: Diagnosis not present

## 2018-10-16 DIAGNOSIS — L03115 Cellulitis of right lower limb: Secondary | ICD-10-CM | POA: Diagnosis not present

## 2018-10-16 DIAGNOSIS — S93491A Sprain of other ligament of right ankle, initial encounter: Secondary | ICD-10-CM | POA: Diagnosis not present

## 2018-10-16 DIAGNOSIS — S83421A Sprain of lateral collateral ligament of right knee, initial encounter: Secondary | ICD-10-CM | POA: Diagnosis not present

## 2018-10-18 DIAGNOSIS — Z23 Encounter for immunization: Secondary | ICD-10-CM | POA: Diagnosis not present

## 2018-10-18 DIAGNOSIS — S83421A Sprain of lateral collateral ligament of right knee, initial encounter: Secondary | ICD-10-CM | POA: Diagnosis not present

## 2018-10-18 DIAGNOSIS — L03115 Cellulitis of right lower limb: Secondary | ICD-10-CM | POA: Diagnosis not present

## 2018-10-18 DIAGNOSIS — M25551 Pain in right hip: Secondary | ICD-10-CM | POA: Diagnosis not present

## 2018-10-18 DIAGNOSIS — S93491A Sprain of other ligament of right ankle, initial encounter: Secondary | ICD-10-CM | POA: Diagnosis not present

## 2018-10-26 DIAGNOSIS — M76821 Posterior tibial tendinitis, right leg: Secondary | ICD-10-CM | POA: Diagnosis not present

## 2018-10-29 DIAGNOSIS — S93401A Sprain of unspecified ligament of right ankle, initial encounter: Secondary | ICD-10-CM | POA: Diagnosis not present

## 2018-11-19 DIAGNOSIS — S93401D Sprain of unspecified ligament of right ankle, subsequent encounter: Secondary | ICD-10-CM | POA: Diagnosis not present

## 2018-11-19 DIAGNOSIS — M25561 Pain in right knee: Secondary | ICD-10-CM | POA: Diagnosis not present

## 2018-11-29 DIAGNOSIS — S76311D Strain of muscle, fascia and tendon of the posterior muscle group at thigh level, right thigh, subsequent encounter: Secondary | ICD-10-CM | POA: Diagnosis not present

## 2018-11-29 DIAGNOSIS — S93401D Sprain of unspecified ligament of right ankle, subsequent encounter: Secondary | ICD-10-CM | POA: Diagnosis not present

## 2018-11-29 DIAGNOSIS — M6281 Muscle weakness (generalized): Secondary | ICD-10-CM | POA: Diagnosis not present

## 2018-12-03 ENCOUNTER — Other Ambulatory Visit: Payer: Self-pay

## 2018-12-03 DIAGNOSIS — M79671 Pain in right foot: Secondary | ICD-10-CM

## 2018-12-06 DIAGNOSIS — S93401D Sprain of unspecified ligament of right ankle, subsequent encounter: Secondary | ICD-10-CM | POA: Diagnosis not present

## 2018-12-06 DIAGNOSIS — M6281 Muscle weakness (generalized): Secondary | ICD-10-CM | POA: Diagnosis not present

## 2018-12-06 DIAGNOSIS — S76311D Strain of muscle, fascia and tendon of the posterior muscle group at thigh level, right thigh, subsequent encounter: Secondary | ICD-10-CM | POA: Diagnosis not present

## 2018-12-09 ENCOUNTER — Encounter (INDEPENDENT_AMBULATORY_CARE_PROVIDER_SITE_OTHER): Payer: Self-pay

## 2018-12-13 DIAGNOSIS — E1142 Type 2 diabetes mellitus with diabetic polyneuropathy: Secondary | ICD-10-CM | POA: Diagnosis not present

## 2018-12-13 DIAGNOSIS — E782 Mixed hyperlipidemia: Secondary | ICD-10-CM | POA: Diagnosis not present

## 2018-12-13 DIAGNOSIS — Z0000 Encounter for general adult medical examination without abnormal findings: Secondary | ICD-10-CM | POA: Diagnosis not present

## 2018-12-16 DIAGNOSIS — E1142 Type 2 diabetes mellitus with diabetic polyneuropathy: Secondary | ICD-10-CM | POA: Diagnosis not present

## 2018-12-16 DIAGNOSIS — Z23 Encounter for immunization: Secondary | ICD-10-CM | POA: Diagnosis not present

## 2018-12-16 DIAGNOSIS — Z0001 Encounter for general adult medical examination with abnormal findings: Secondary | ICD-10-CM | POA: Diagnosis not present

## 2018-12-16 DIAGNOSIS — E1165 Type 2 diabetes mellitus with hyperglycemia: Secondary | ICD-10-CM | POA: Diagnosis not present

## 2018-12-16 DIAGNOSIS — F17210 Nicotine dependence, cigarettes, uncomplicated: Secondary | ICD-10-CM | POA: Diagnosis not present

## 2018-12-16 DIAGNOSIS — E668 Other obesity: Secondary | ICD-10-CM | POA: Diagnosis not present

## 2018-12-17 DIAGNOSIS — S93401D Sprain of unspecified ligament of right ankle, subsequent encounter: Secondary | ICD-10-CM | POA: Diagnosis not present

## 2018-12-17 DIAGNOSIS — S76311D Strain of muscle, fascia and tendon of the posterior muscle group at thigh level, right thigh, subsequent encounter: Secondary | ICD-10-CM | POA: Diagnosis not present

## 2018-12-17 DIAGNOSIS — M6281 Muscle weakness (generalized): Secondary | ICD-10-CM | POA: Diagnosis not present

## 2018-12-20 DIAGNOSIS — M6281 Muscle weakness (generalized): Secondary | ICD-10-CM | POA: Diagnosis not present

## 2018-12-20 DIAGNOSIS — S76311D Strain of muscle, fascia and tendon of the posterior muscle group at thigh level, right thigh, subsequent encounter: Secondary | ICD-10-CM | POA: Diagnosis not present

## 2018-12-20 DIAGNOSIS — S93401D Sprain of unspecified ligament of right ankle, subsequent encounter: Secondary | ICD-10-CM | POA: Diagnosis not present

## 2018-12-24 DIAGNOSIS — S76311D Strain of muscle, fascia and tendon of the posterior muscle group at thigh level, right thigh, subsequent encounter: Secondary | ICD-10-CM | POA: Diagnosis not present

## 2018-12-24 DIAGNOSIS — M6281 Muscle weakness (generalized): Secondary | ICD-10-CM | POA: Diagnosis not present

## 2018-12-24 DIAGNOSIS — S93401D Sprain of unspecified ligament of right ankle, subsequent encounter: Secondary | ICD-10-CM | POA: Diagnosis not present

## 2018-12-31 DIAGNOSIS — S93401D Sprain of unspecified ligament of right ankle, subsequent encounter: Secondary | ICD-10-CM | POA: Diagnosis not present

## 2018-12-31 DIAGNOSIS — M25571 Pain in right ankle and joints of right foot: Secondary | ICD-10-CM | POA: Diagnosis not present

## 2018-12-31 DIAGNOSIS — S76311D Strain of muscle, fascia and tendon of the posterior muscle group at thigh level, right thigh, subsequent encounter: Secondary | ICD-10-CM | POA: Diagnosis not present

## 2018-12-31 DIAGNOSIS — M6281 Muscle weakness (generalized): Secondary | ICD-10-CM | POA: Diagnosis not present

## 2018-12-31 IMAGING — DX DG KNEE COMPLETE 4+V*R*
4 series · 4 of 4 positions shown · non-contrast
Comparison: None.

CLINICAL DATA: Initial evaluation for acute trauma, fall.

EXAM:
RIGHT KNEE - COMPLETE 4+ VIEW

[knee ap (1 of 3)]
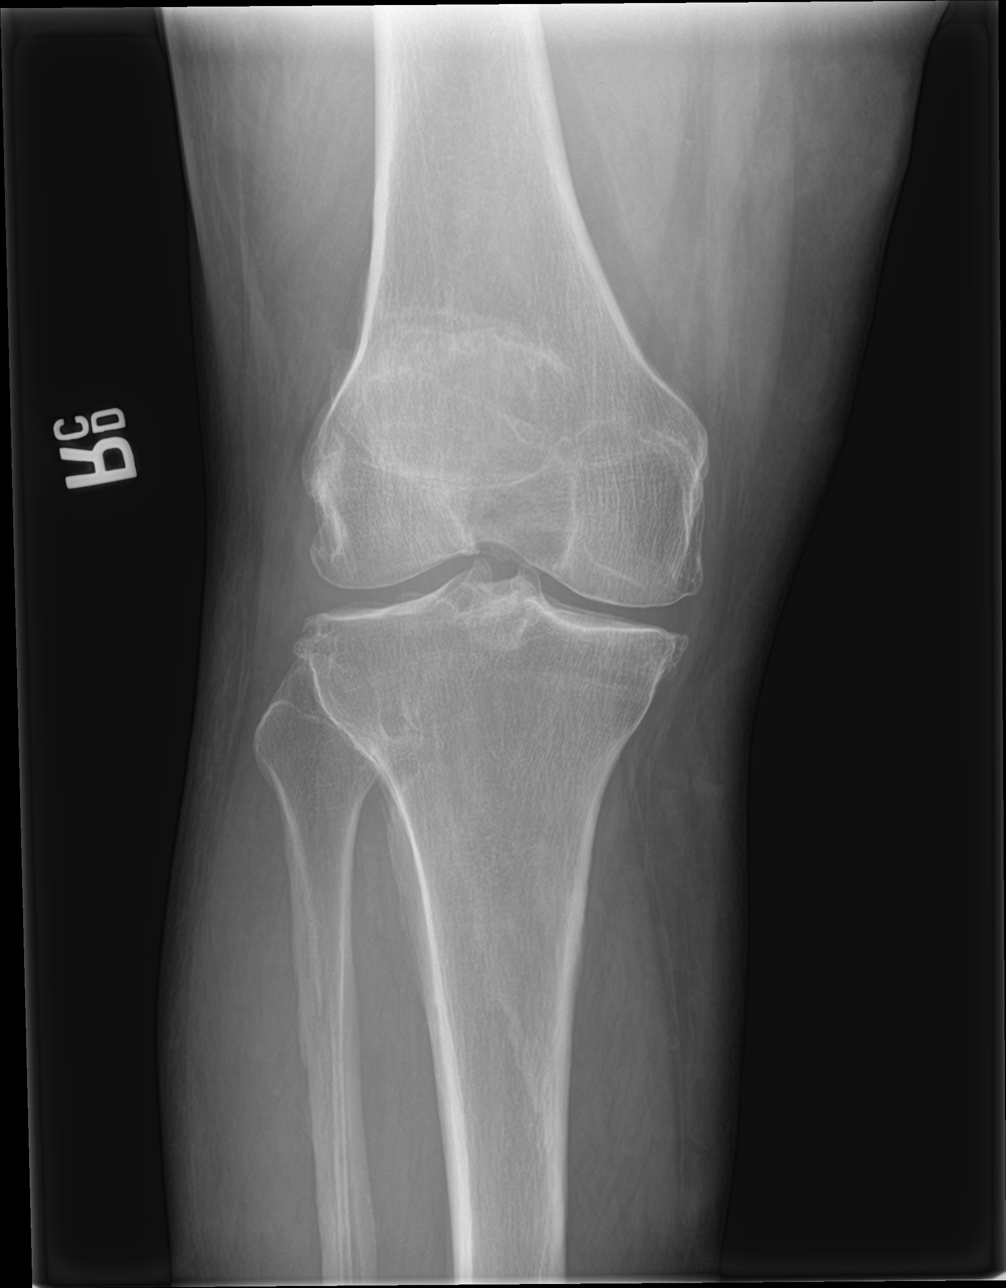

[knee lat]
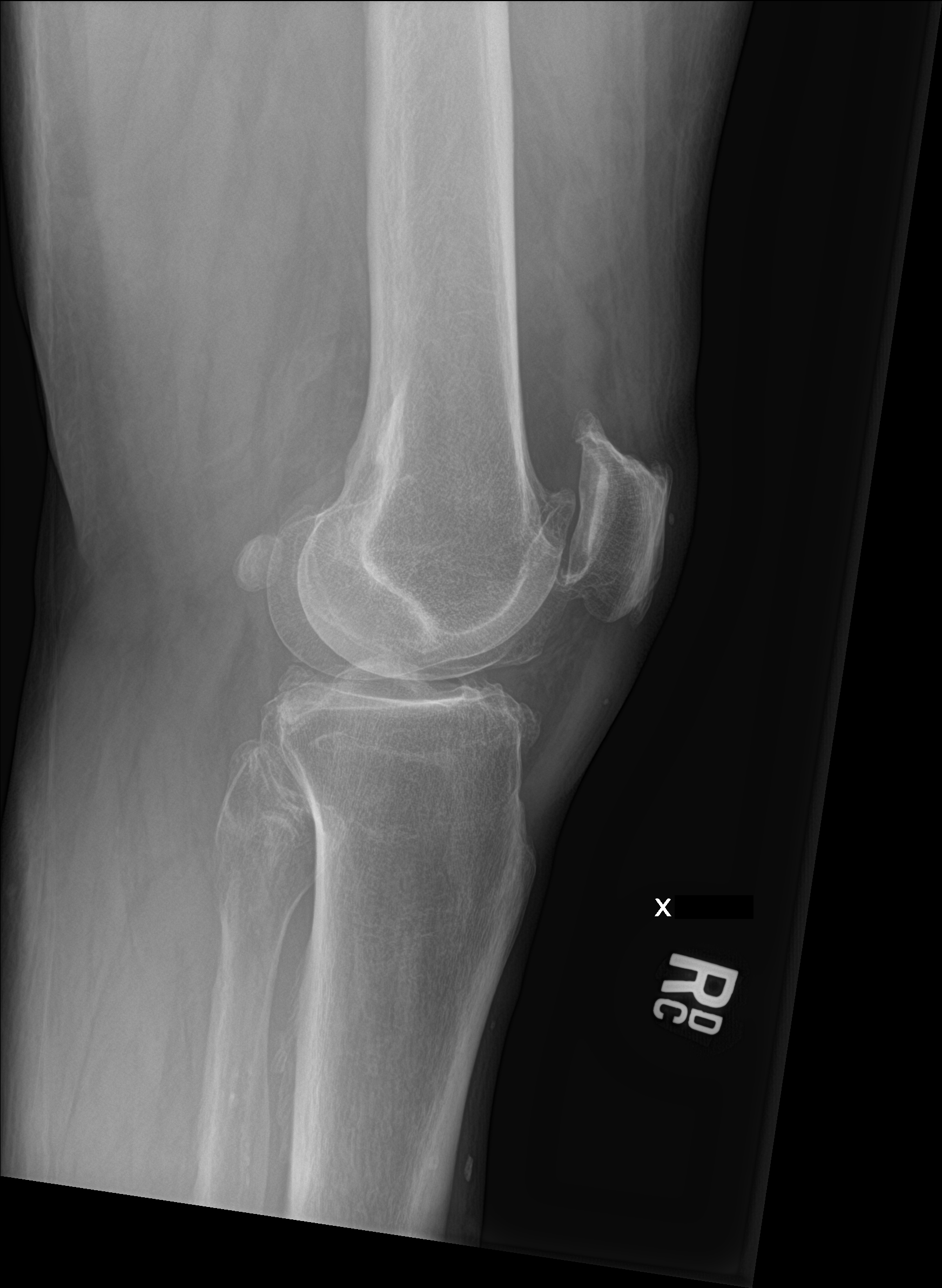

[knee ap (2 of 3)]
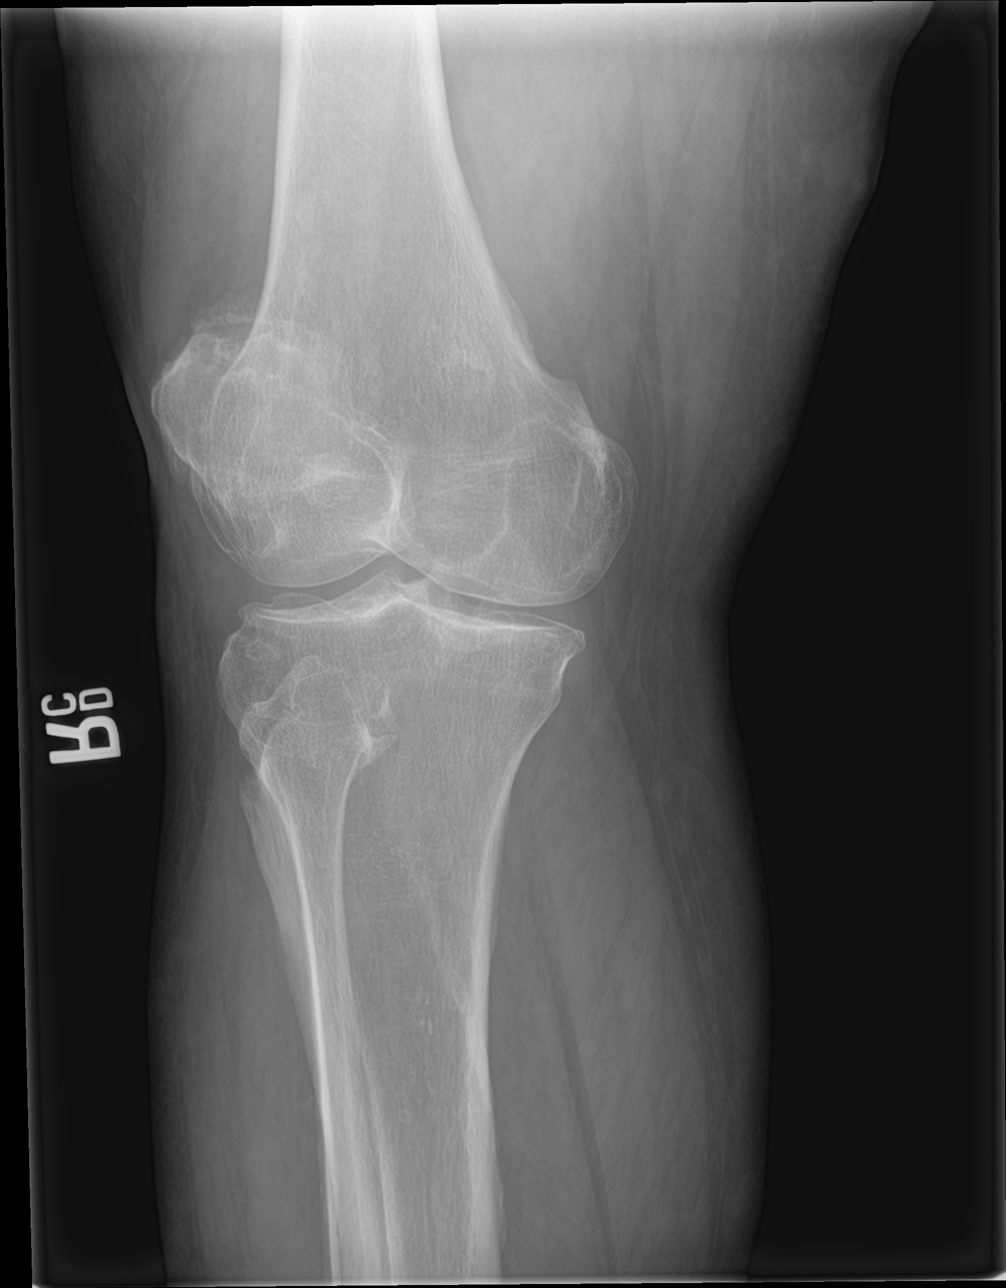

[knee ap (3 of 3)]
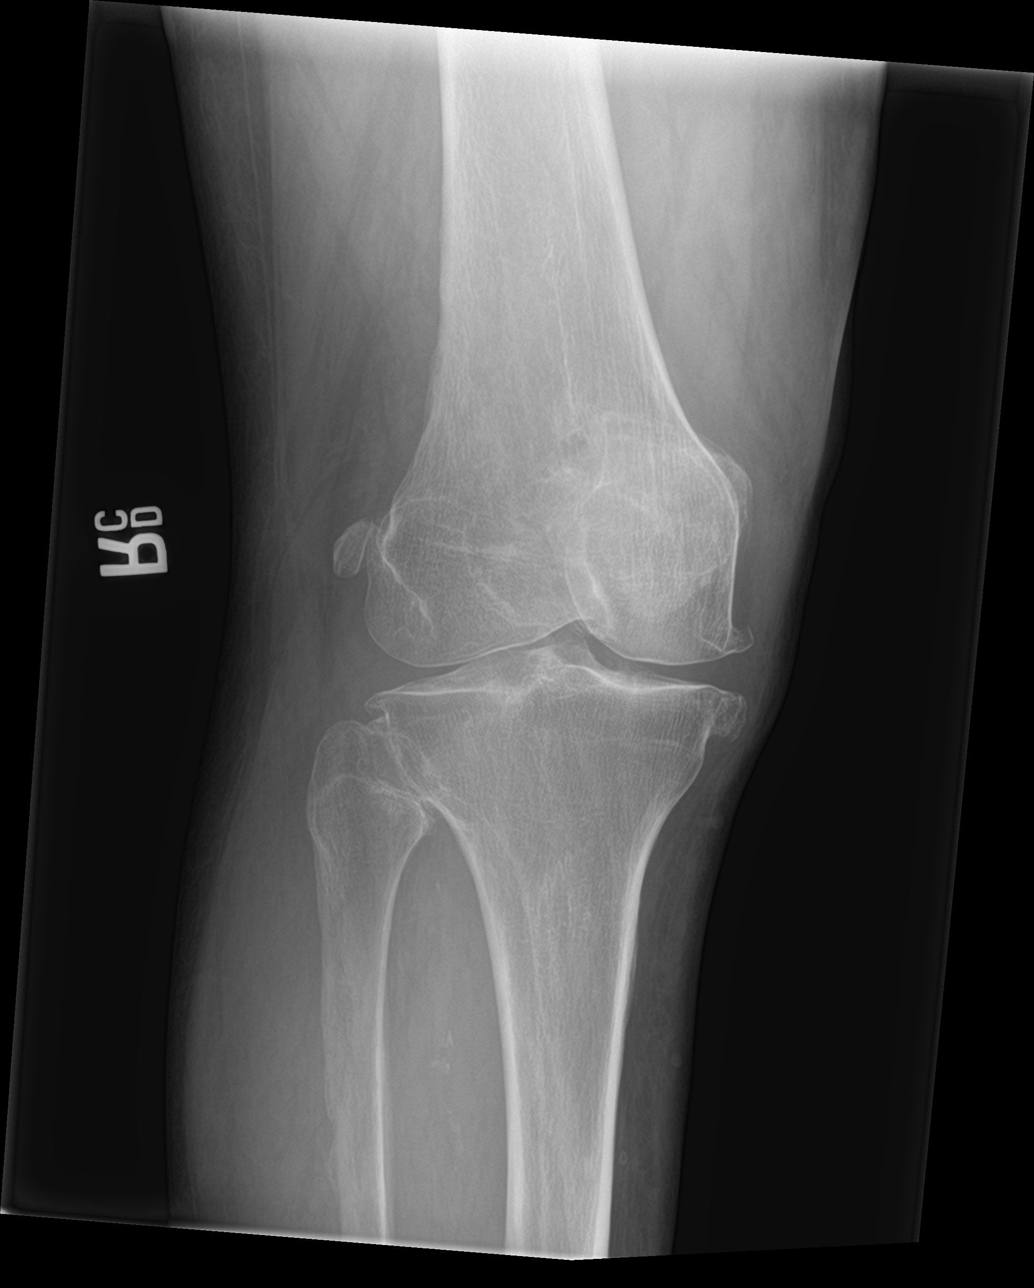

[4 of 4 positions shown; findings below may reference images not displayed]

FINDINGS: No acute fracture dislocation. No joint effusion. Moderate
tricompartmental degenerative osteoarthrosis. No acute soft tissue
abnormality. Few vascular calcifications noted.
IMPRESSION: 1. No acute osseous abnormality about the right knee.
2. Moderate tricompartmental degenerative osteoarthrosis.

## 2018-12-31 IMAGING — DX DG HIP (WITH OR WITHOUT PELVIS) 2-3V*R*
3 series · 4 of 4 positions shown · non-contrast
Comparison: None.

CLINICAL DATA: Initial evaluation for acute trauma, fall.

EXAM:
DG HIP (WITH OR WITHOUT PELVIS) 2-3V RIGHT

[Series 1: pelvis ap · 0.14mm/px · 2 of 2 slices shown]
[im 1/2]
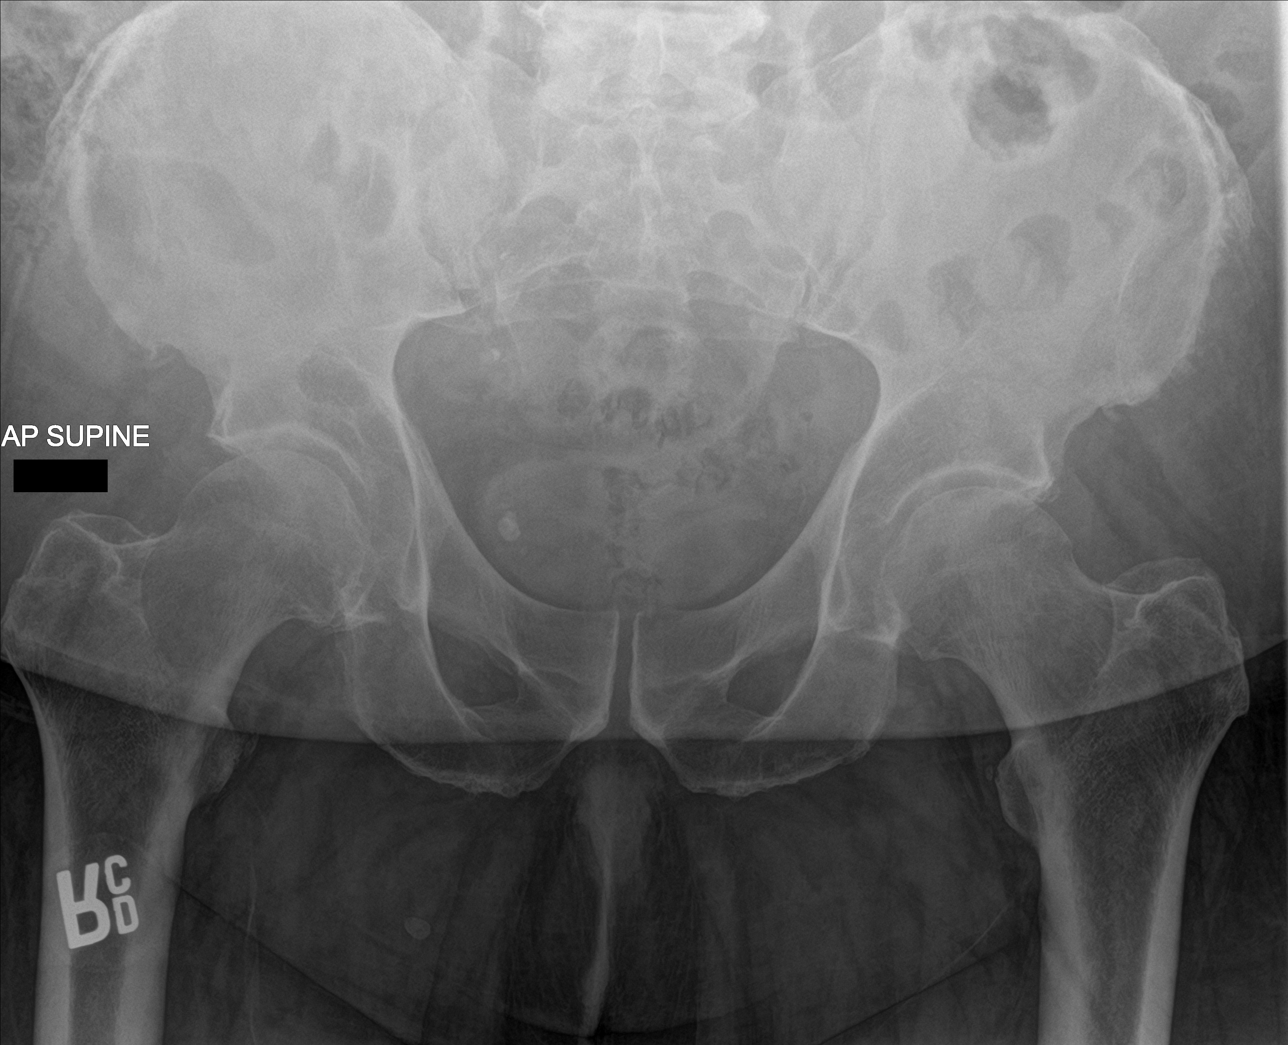
[im 2/2]
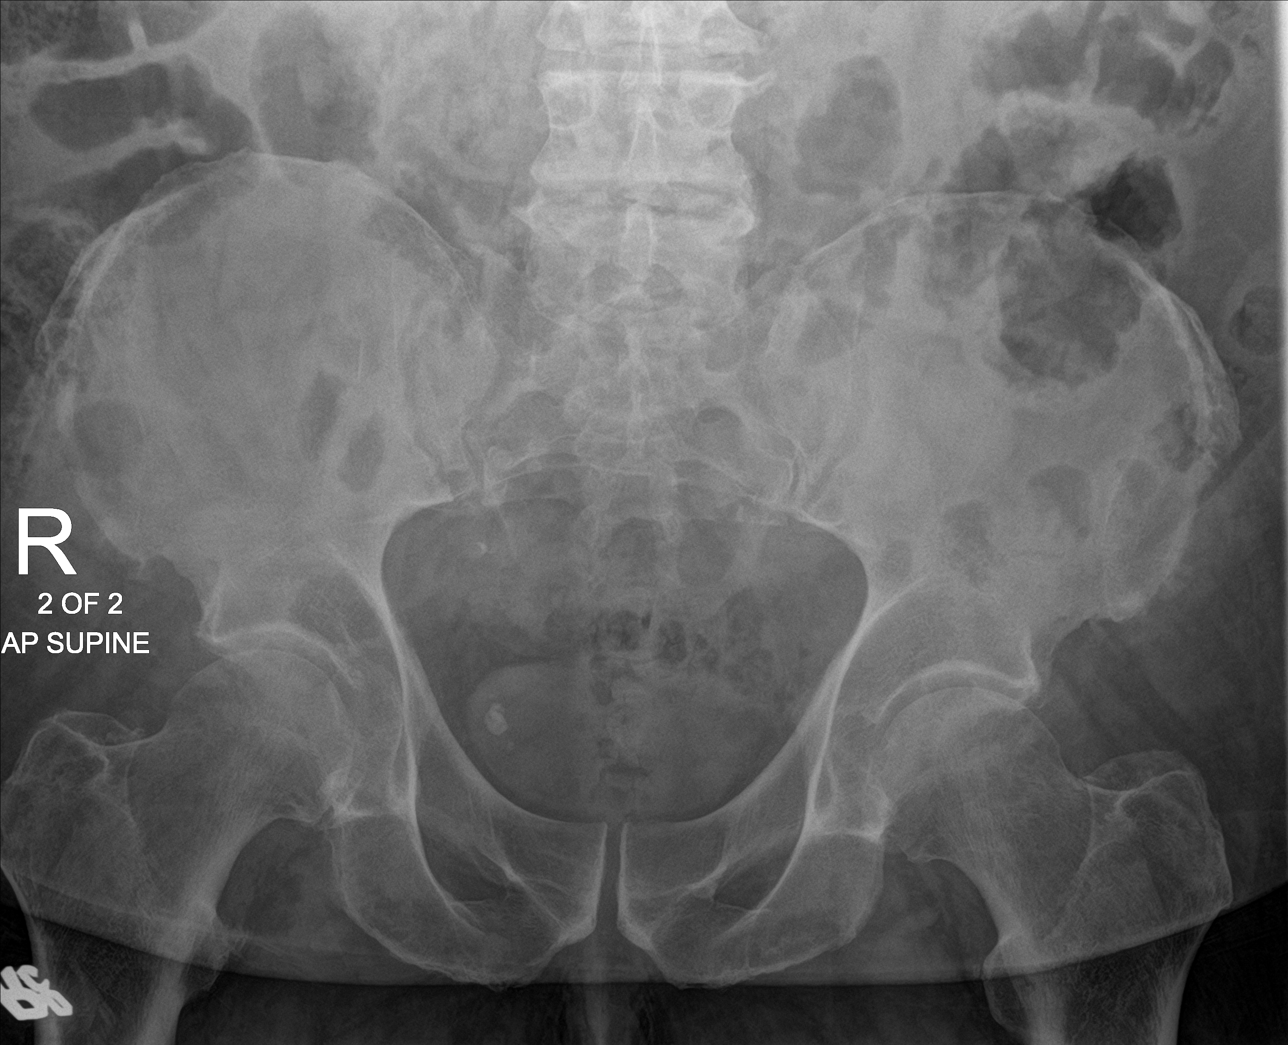

[hip ap]
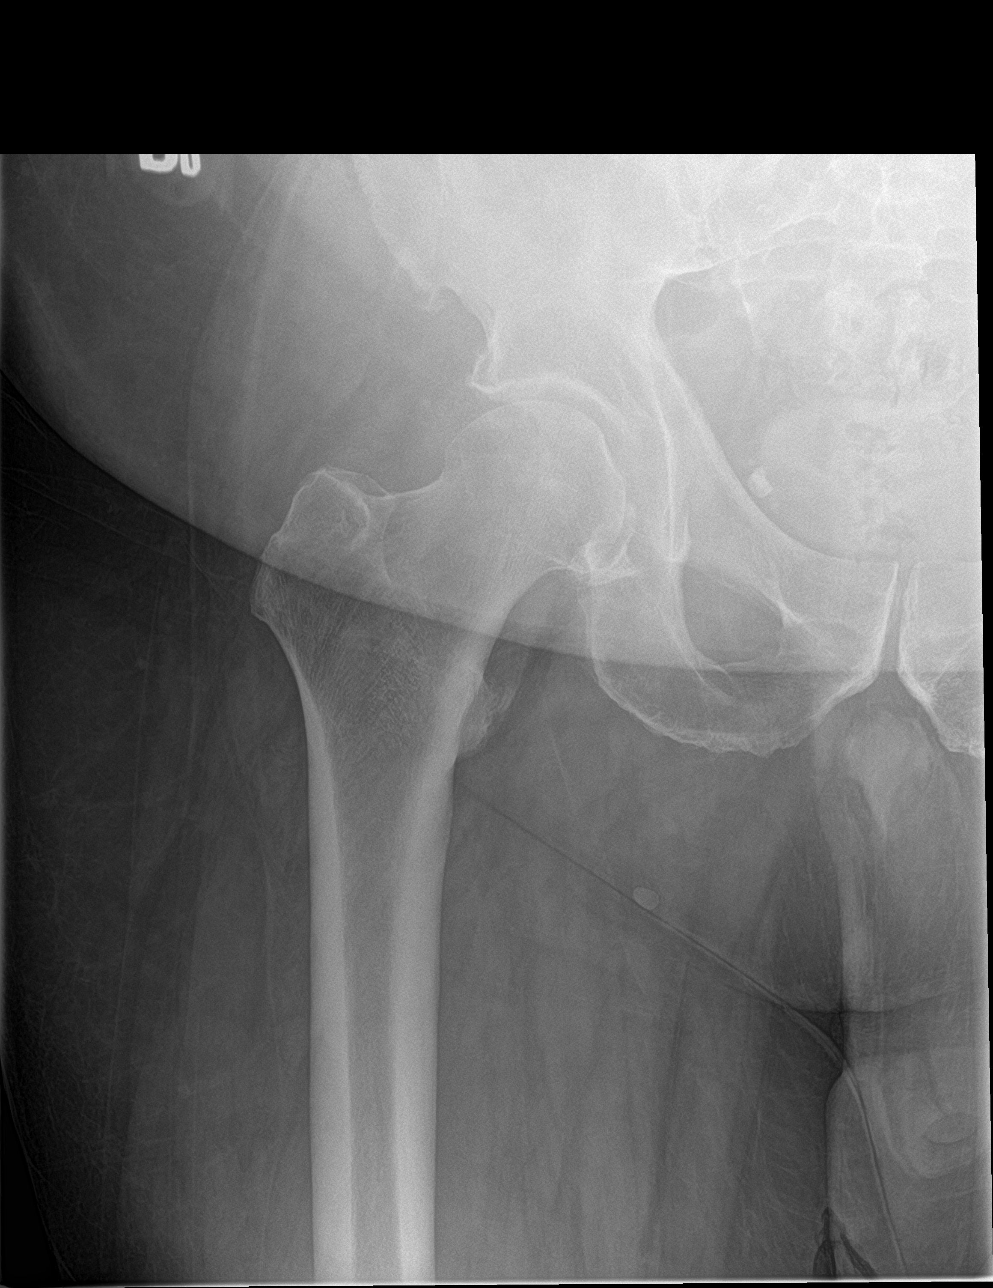

[hip lat]
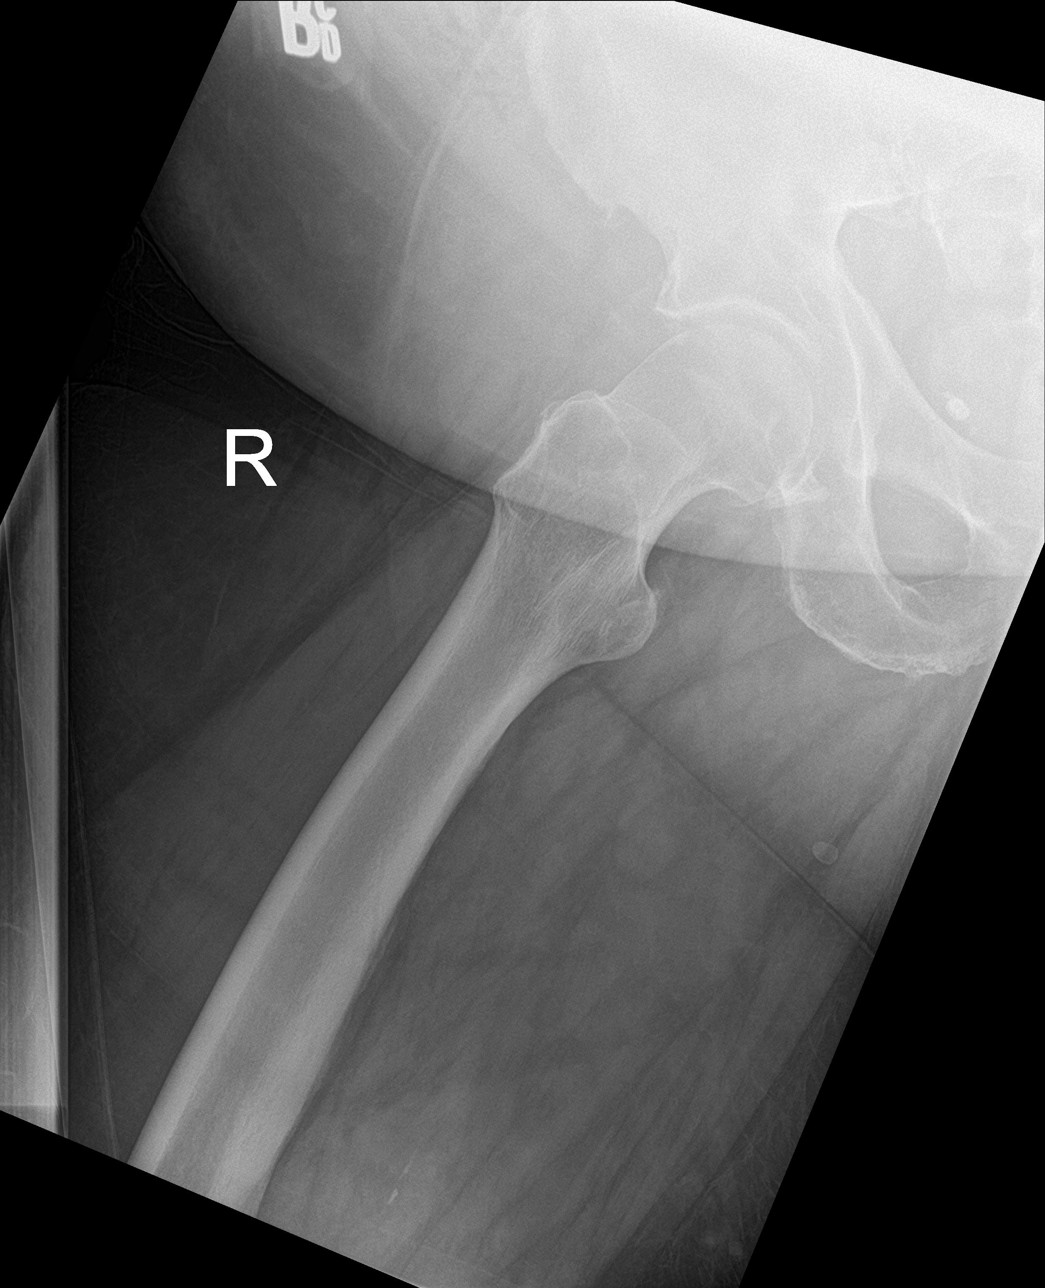

[4 of 4 positions shown; findings below may reference images not displayed]

FINDINGS: No acute fracture or dislocation. Femoral heads in normal line
within the acetabula. Bony pelvis intact. Moderate osteoarthritic
changes about the hips bilaterally. No acute soft tissue
abnormality. Degenerative changes noted within lower lumbar spine.
IMPRESSION: No acute osseous abnormality about the right hip.

## 2019-01-03 ENCOUNTER — Ambulatory Visit (HOSPITAL_COMMUNITY): Admission: RE | Admit: 2019-01-03 | Payer: BLUE CROSS/BLUE SHIELD | Source: Ambulatory Visit

## 2019-01-03 ENCOUNTER — Ambulatory Visit: Payer: BLUE CROSS/BLUE SHIELD | Admitting: Vascular Surgery

## 2019-01-03 ENCOUNTER — Encounter: Payer: Self-pay | Admitting: Vascular Surgery

## 2019-01-03 VITALS — BP 141/80 | HR 82 | Temp 96.8°F | Resp 18 | Ht 70.0 in | Wt 304.6 lb

## 2019-01-03 DIAGNOSIS — M79671 Pain in right foot: Secondary | ICD-10-CM

## 2019-01-03 NOTE — Progress Notes (Signed)
[facility] [place]  Patient name: Derrick Clark MRN: [id number] DOB: Oct 14, 1961 Sex: male  REASON FOR CONSULT: Evaluation ischemic changes and pain of his right fifth toe  Seen today in our [place] office  HPI: Derrick Clark is a 58 y.o. male, who is here today for evaluation of pain and cyanosis in his right fifth toe.  He had a fall in October and reports injuring his foot.  He has seen orthopedic on 2 occasions and is being treated for ankle sprain.  He reports that he had what sounds like a blood blister on the tip of his right fifth toe at the time of injury.  He is continued to have pain with this and has cyanosis of his entire toe.  He does not give any history of claudication type discomfort or prior history of peripheral vascular occlusive disease.  He is a long-term cigarette smoker.  He is diabetic.  He reports that it is difficult for him to walk due to pain in his right fifth toe.  Past Medical History:  Diagnosis Date  . Diabetes mellitus without complication ([place])     History reviewed. No pertinent family history.  SOCIAL HISTORY: Social History   Socioeconomic History  . Marital status: Legally Separated    Spouse name: Not on file  . Number of children: Not on file  . Years of education: Not on file  . Highest education level: Not on file  Occupational History  . Not on file  Social Needs  . Financial resource strain: Not on file  . Food insecurity:    Worry: Not on file    Inability: Not on file  . Transportation needs:    Medical: Not on file    Non-medical: Not on file  Tobacco Use  . Smoking status: Current Every Day Smoker    Packs/day: 0.75    Types: Cigarettes  . Smokeless tobacco: Never Used  Substance and Sexual Activity  . Alcohol use: No    Frequency: Never  . Drug use: No  . Sexual activity: Not on file  Lifestyle  . Physical activity:    Days per week: Not on file    Minutes  per session: Not on file  . Stress: Not on file  Relationships  . Social connections:    Talks on phone: Not on file    Gets together: Not on file    Attends religious service: Not on file    Active member of club or organization: Not on file    Attends meetings of clubs or organizations: Not on file    Relationship status: Not on file  . Intimate partner violence:    Fear of current or ex partner: Not on file    Emotionally abused: Not on file    Physically abused: Not on file    Forced sexual activity: Not on file  Other Topics Concern  . Not on file  Social History Narrative  . Not on file    Allergies  Allergen Reactions  . Farxiga [Dapagliflozin]   . Jardiance [Empagliflozin] Rash  . Sulfa Antibiotics Rash    Current Outpatient Medications  Medication Sig Dispense Refill  . aspirin 81 MG chewable tablet Chew by mouth daily.    Marland Kitchen acetaminophen (TYLENOL) 500 MG tablet Take 1,000-1,500 mg by mouth every 6 (six) hours as needed for moderate pain.    Marland Kitchen gabapentin (NEURONTIN) 600 MG tablet Take 600 mg by mouth 3 (  three) times daily as needed.  0  . glipiZIDE (GLUCOTROL XL) 10 MG 24 hr tablet Take 10 mg by mouth daily.    Marland Kitchen HYDROcodone-acetaminophen (NORCO) 5-325 MG tablet Take 1-2 tablets by mouth every 6 (six) hours as needed for severe pain. 10 tablet 0  . metFORMIN (GLUCOPHAGE) 500 MG tablet Take 1,000 mg by mouth 2 (two) times daily with a meal.    . simvastatin (ZOCOR) 40 MG tablet Take 40 mg by mouth daily.     No current facility-administered medications for this visit.     REVIEW OF SYSTEMS:  [X]  denotes positive finding, [ ]  denotes negative finding Cardiac  Comments:  Chest pain or chest pressure:    Shortness of breath upon exertion:    Short of breath when lying flat:    Irregular heart rhythm:        Vascular    Pain in calf, thigh, or hip brought on by ambulation:    Pain in feet at night that wakes you up from your sleep:     Blood clot in your veins:     Leg swelling:         Pulmonary    Oxygen at home:    Productive cough:     Wheezing:         Neurologic    Sudden weakness in arms or legs:     Sudden numbness in arms or legs:     Sudden onset of difficulty speaking or slurred speech:    Temporary loss of vision in one eye:     Problems with dizziness:         Gastrointestinal    Blood in stool:     Vomited blood:         Genitourinary    Burning when urinating:     Blood in urine:        Psychiatric    Major depression:         Hematologic    Bleeding problems:    Problems with blood clotting too easily:        Skin    Rashes or ulcers:        Constitutional    Fever or chills:      PHYSICAL EXAM: Vitals:   01/03/19 0943  BP: (!) 141/80  Pulse: 82  Resp: 18  Temp: (!) 96.8 F (36 C)  TempSrc: Temporal  Weight: (!) 304 lb 9.6 oz (138.2 kg)  Height: 5\' 10"  (1.778 m)    GENERAL: The patient is a well-nourished male, in no acute distress. The vital signs are documented above. CARDIOVASCULAR: 2 plus radial pulses.  I do not palpate femoral pulses probably due to his obesity.  He does have 1+ dorsalis pedis pulses bilaterally. PULMONARY: There is good air exchange  ABDOMEN: Soft and non-tender  MUSCULOSKELETAL: There are no major deformities or cyanosis. NEUROLOGIC: No focal weakness or paresthesias are detected. SKIN: There are no ulcers or rashes noted.  He does have cyanosis of his right fifth toe and a eschar at the tip of his right fifth toe PSYCHIATRIC: The patient has a normal affect.  DATA:  None.  Of note he was to have lower extremity arterial noninvasive studies at [facility] but did not obtain these.  Hand-held Doppler reveals flow in the dorsalis pedis, posterior tibial and peroneal bilaterally although this does sound dampened  MEDICAL ISSUES:  Had long discussion with the patient.  He does have palpable pedal  pulses bilaterally but does appear to have some diminished flow.  I  have recommended that we obtain noninvasive studies for documentation of this.  I will see him again in 1 month.  Feel that he is borderline and having ability to heal this area of his fifth toe.  He does not have any evidence of invasive infection or other critical issues currently.  Explained that this may proceed to mummified as well.  He may require formal toe amputation and the main issue is his adequacy of arterial flow.  We will obtain noninvasive studies and see him again in 1 month  Rosetta Posner, MD [facility] Tel [phone] Pager [phone]

## 2019-01-04 ENCOUNTER — Other Ambulatory Visit: Payer: Self-pay

## 2019-01-04 DIAGNOSIS — M79671 Pain in right foot: Secondary | ICD-10-CM

## 2019-01-24 ENCOUNTER — Ambulatory Visit: Payer: BLUE CROSS/BLUE SHIELD | Admitting: Vascular Surgery

## 2019-01-24 ENCOUNTER — Other Ambulatory Visit (HOSPITAL_COMMUNITY): Payer: BLUE CROSS/BLUE SHIELD

## 2019-01-24 ENCOUNTER — Ambulatory Visit (HOSPITAL_COMMUNITY)
Admission: RE | Admit: 2019-01-24 | Discharge: 2019-01-24 | Disposition: A | Discharge status: Home / Self Care | Payer: BLUE CROSS/BLUE SHIELD | Source: Ambulatory Visit | Attending: Vascular Surgery | Admitting: Vascular Surgery

## 2019-01-24 ENCOUNTER — Encounter: Payer: Self-pay | Admitting: Vascular Surgery

## 2019-01-24 VITALS — BP 125/79 | HR 83 | Temp 97.7°F | Resp 20 | Ht 70.0 in | Wt 298.0 lb

## 2019-01-24 DIAGNOSIS — I739 Peripheral vascular disease, unspecified: Secondary | ICD-10-CM

## 2019-01-24 DIAGNOSIS — S91301A Unspecified open wound, right foot, initial encounter: Secondary | ICD-10-CM | POA: Diagnosis not present

## 2019-01-24 DIAGNOSIS — M79671 Pain in right foot: Secondary | ICD-10-CM

## 2019-01-24 MED ORDER — OXYCODONE-ACETAMINOPHEN 5-325 MG PO TABS: 1.0000 | ORAL_TABLET | ORAL | 0 refills | Status: DC | PRN

## 2019-01-24 NOTE — Progress Notes (Signed)
[facility] of [place]  Patient name: Derrick Clark MRN: [id number] DOB: 05-10-1961 Sex: male   [place] office  REASON FOR VISIT: Follow-up right foot ischemia  HPI: Derrick Clark is a 58 y.o. male here today for follow-up.  Had been seen several weeks ago with ischemic changes of his fifth toe.  No invasive infection at that time.  Is seen today for follow-up.  Went noninvasive studies today at [facility].  He reports continued pain which keeps him awake at night.  No fevers.  Past Medical History:  Diagnosis Date  . Diabetes mellitus without complication ([place])     History reviewed. No pertinent family history.  SOCIAL HISTORY: Social History   Tobacco Use  . Smoking status: Current Every Day Smoker    Packs/day: 0.75    Types: Cigarettes  . Smokeless tobacco: Never Used  Substance Use Topics  . Alcohol use: No    Frequency: Never    Allergies  Allergen Reactions  . Farxiga [Dapagliflozin]   . Jardiance [Empagliflozin] Rash  . Sulfa Antibiotics Rash    Current Outpatient Medications  Medication Sig Dispense Refill  . Dulaglutide (TRULICITY) 4.13 KG/4.0NU SOPN Inject into the skin.    Marland Kitchen aspirin 81 MG chewable tablet Chew by mouth daily.    Marland Kitchen gabapentin (NEURONTIN) 600 MG tablet Take 600 mg by mouth 3 (three) times daily as needed.  0  . glipiZIDE (GLUCOTROL XL) 10 MG 24 hr tablet Take 10 mg by mouth daily.    Marland Kitchen HYDROcodone-acetaminophen (NORCO) 5-325 MG tablet Take 1-2 tablets by mouth every 6 (six) hours as needed for severe pain. 10 tablet 0  . metFORMIN (GLUCOPHAGE) 500 MG tablet Take 1,000 mg by mouth 2 (two) times daily with a meal.    . oxyCODONE-acetaminophen (PERCOCET/ROXICET) 5-325 MG tablet Take 1 tablet by mouth every 4 (four) hours as needed for severe pain. 30 tablet 0  . simvastatin (ZOCOR) 40 MG tablet Take 40 mg by mouth daily.     No current facility-administered medications for this  visit.     REVIEW OF SYSTEMS:  [X]  denotes positive finding, [ ]  denotes negative finding Cardiac  Comments:  Chest pain or chest pressure:    Shortness of breath upon exertion:    Short of breath when lying flat:    Irregular heart rhythm:        Vascular    Pain in calf, thigh, or hip brought on by ambulation:    Pain in feet at night that wakes you up from your sleep:  x   Blood clot in your veins:    Leg swelling:           PHYSICAL EXAM: Vitals:   01/24/19 1032  BP: 125/79  Pulse: 83  Resp: 20  Temp: 97.7 F (36.5 C)  Weight: 298 lb (135.2 kg)  Height: 5\' 10"  (1.778 m)    GENERAL: The patient is a well-nourished male, in no acute distress. The vital signs are documented above. CARDIOVASCULAR: I cannot palpate a right dorsalis pedis pulse.  1+ left dorsalis pedis pulse today. PULMONARY: There is good air exchange  MUSCULOSKELETAL: There are no major deformities or cyanosis. NEUROLOGIC: No focal weakness or paresthesias are detected. SKIN: There are no ulcers or rashes noted.  Does have cyanosis of his right fifth toe with some blistering and drainage on the medial aspect PSYCHIATRIC: The patient has a normal affect.  DATA:  Noninvasive studies reveal ankle  arm index normal on the left and 0.76 on the right  MEDICAL ISSUES: Any difficulty with pain and nonhealing of his right fifth toe.  Noninvasive studies confirm arterial insufficiency.  This has persisted despite several months of conservative treatment.  Recommended arteriogram for further evaluation.  Explained that if he had a lesion amenable to endovascular treatment that this would be accomplished at the same time.  If this is too extensive will discuss surgical revascularization.  Also explained that he will require eventual toe amputation but need to assure adequate arterial flow for healing and will schedule Theragran at his convenience at [facility].  Did request Percocet for pain.  I feel this is  appropriate due to his level of ischemia.  Chronically sent a prescription for Percocet 5/325 #30 no refill   Rosetta Posner, MD [facility] Tel [phone] Pager [phone]

## 2019-01-26 ENCOUNTER — Other Ambulatory Visit: Payer: Self-pay | Admitting: *Deleted

## 2019-01-26 NOTE — Progress Notes (Signed)
Phone call to patient to schedule procedure. Instructed to be at [facility] admitting at 7:00 am on 01/31/2019. Must have a driver and caregiver for discharge to home. Hold PM dose of Metformin on 01/30/2019. NPO past MN  And hold all am medications until after procedure on 2/3. CBG/ Diabetic guidelines per [facility] protocol for procedures given to patient. Verbalized understanding.

## 2019-01-28 ENCOUNTER — Telehealth: Payer: Self-pay | Admitting: *Deleted

## 2019-01-28 NOTE — Telephone Encounter (Signed)
Patient called requesting additional pain medication, stating he was "about" out.  Upon checking office notes and medication list , he was prescribed #30  01/24/2019 to take 1 every 4 hrs for severe pain and is receiving #30 weekly from Dr Lucia Gaskins.  Called patients # and wife states that he "ran [place]own for a [place]".

## 2019-01-31 ENCOUNTER — Observation Stay (HOSPITAL_COMMUNITY)
Admission: AD | Admit: 2019-01-31 | Discharge: 2019-02-02 | Disposition: A | Discharge status: Home / Self Care | Payer: BLUE CROSS/BLUE SHIELD | Attending: Vascular Surgery | Admitting: Vascular Surgery

## 2019-01-31 ENCOUNTER — Encounter (HOSPITAL_COMMUNITY)
Admission: AD | Disposition: A | Discharge status: Home / Self Care | Payer: Self-pay | Source: Home / Self Care | Attending: Vascular Surgery

## 2019-01-31 ENCOUNTER — Other Ambulatory Visit: Payer: Self-pay

## 2019-01-31 ENCOUNTER — Encounter (HOSPITAL_COMMUNITY): Payer: Self-pay | Admitting: General Practice

## 2019-01-31 DIAGNOSIS — N186 End stage renal disease: Secondary | ICD-10-CM | POA: Insufficient documentation

## 2019-01-31 DIAGNOSIS — E1152 Type 2 diabetes mellitus with diabetic peripheral angiopathy with gangrene: Secondary | ICD-10-CM | POA: Diagnosis not present

## 2019-01-31 DIAGNOSIS — M1990 Unspecified osteoarthritis, unspecified site: Secondary | ICD-10-CM | POA: Insufficient documentation

## 2019-01-31 DIAGNOSIS — I491 Atrial premature depolarization: Secondary | ICD-10-CM | POA: Diagnosis not present

## 2019-01-31 DIAGNOSIS — Z7982 Long term (current) use of aspirin: Secondary | ICD-10-CM | POA: Diagnosis not present

## 2019-01-31 DIAGNOSIS — E1122 Type 2 diabetes mellitus with diabetic chronic kidney disease: Secondary | ICD-10-CM | POA: Diagnosis not present

## 2019-01-31 DIAGNOSIS — Z882 Allergy status to sulfonamides status: Secondary | ICD-10-CM | POA: Insufficient documentation

## 2019-01-31 DIAGNOSIS — Z7984 Long term (current) use of oral hypoglycemic drugs: Secondary | ICD-10-CM | POA: Insufficient documentation

## 2019-01-31 DIAGNOSIS — L97519 Non-pressure chronic ulcer of other part of right foot with unspecified severity: Secondary | ICD-10-CM | POA: Insufficient documentation

## 2019-01-31 DIAGNOSIS — I739 Peripheral vascular disease, unspecified: Secondary | ICD-10-CM | POA: Diagnosis present

## 2019-01-31 DIAGNOSIS — E11621 Type 2 diabetes mellitus with foot ulcer: Secondary | ICD-10-CM | POA: Insufficient documentation

## 2019-01-31 DIAGNOSIS — Z6841 Body Mass Index (BMI) 40.0 and over, adult: Secondary | ICD-10-CM | POA: Insufficient documentation

## 2019-01-31 DIAGNOSIS — Z79899 Other long term (current) drug therapy: Secondary | ICD-10-CM | POA: Insufficient documentation

## 2019-01-31 DIAGNOSIS — Z888 Allergy status to other drugs, medicaments and biological substances status: Secondary | ICD-10-CM | POA: Insufficient documentation

## 2019-01-31 DIAGNOSIS — I70261 Atherosclerosis of native arteries of extremities with gangrene, right leg: Secondary | ICD-10-CM | POA: Insufficient documentation

## 2019-01-31 HISTORY — DX: Peripheral vascular disease, unspecified: I73.9

## 2019-01-31 HISTORY — DX: Unspecified osteoarthritis, unspecified site: M19.90

## 2019-01-31 HISTORY — PX: ABDOMINAL AORTOGRAM W/LOWER EXTREMITY: CATH118223

## 2019-01-31 HISTORY — PX: PERIPHERAL VASCULAR INTERVENTION: CATH118257

## 2019-01-31 LAB — GLUCOSE, CAPILLARY
GLUCOSE-CAPILLARY: 185 mg/dL — AB (ref 70–99)
Glucose-Capillary: 141 mg/dL — ABNORMAL HIGH (ref 70–99)
Glucose-Capillary: 193 mg/dL — ABNORMAL HIGH (ref 70–99)
Glucose-Capillary: 208 mg/dL — ABNORMAL HIGH (ref 70–99)

## 2019-01-31 LAB — POCT I-STAT 4, (NA,K, GLUC, HGB,HCT)
Glucose, Bld: 181 mg/dL — ABNORMAL HIGH (ref 70–99)
HCT: 42 % (ref 39.0–52.0)
Hemoglobin: 14.3 g/dL (ref 13.0–17.0)
Potassium: 4.3 mmol/L (ref 3.5–5.1)
Sodium: 138 mmol/L (ref 135–145)

## 2019-01-31 LAB — CBC
HCT: 40.6 % (ref 39.0–52.0)
Hemoglobin: 14.1 g/dL (ref 13.0–17.0)
MCH: 31.3 pg (ref 26.0–34.0)
MCHC: 34.7 g/dL (ref 30.0–36.0)
MCV: 90.2 fL (ref 80.0–100.0)
Platelets: 244 10*3/uL (ref 150–400)
RBC: 4.5 MIL/uL (ref 4.22–5.81)
RDW: 11.8 % (ref 11.5–15.5)
WBC: 9 10*3/uL (ref 4.0–10.5)
nRBC: 0 % (ref 0.0–0.2)

## 2019-01-31 LAB — CREATININE, SERUM
Creatinine, Ser: 0.82 mg/dL (ref 0.61–1.24)
GFR calc Af Amer: >60 mL/min (ref >60)

## 2019-01-31 LAB — POCT I-STAT CREATININE: Creatinine, Ser: 0.7 mg/dL (ref 0.61–1.24)

## 2019-01-31 SURGERY — ABDOMINAL AORTOGRAM W/LOWER EXTREMITY
Anesthesia: LOCAL | Laterality: Right

## 2019-01-31 MED ORDER — GABAPENTIN 600 MG PO TABS: 600.0000 mg | ORAL_TABLET | Freq: Three times a day (TID) | ORAL | Status: DC | PRN

## 2019-01-31 MED ORDER — IODIXANOL 320 MG/ML IV SOLN
INTRAVENOUS | Status: DC | PRN
  Administered 2019-01-31: 70 mL via INTRA_ARTERIAL

## 2019-01-31 MED ORDER — CLOPIDOGREL BISULFATE 300 MG PO TABS
ORAL_TABLET | ORAL | Status: AC
  Filled 2019-01-31: qty 1

## 2019-01-31 MED ORDER — MORPHINE SULFATE (PF) 10 MG/ML IV SOLN: 2.0000 mg | INTRAVENOUS | Status: DC | PRN

## 2019-01-31 MED ORDER — CYCLOBENZAPRINE HCL 10 MG PO TABS: 10.0000 mg | ORAL_TABLET | Freq: Three times a day (TID) | ORAL | Status: DC | PRN

## 2019-01-31 MED ORDER — SODIUM CHLORIDE 0.9 % IV SOLN
250.0000 mL | INTRAVENOUS | Status: DC | PRN
  Administered 2019-02-01 (×2): via INTRAVENOUS

## 2019-01-31 MED ORDER — OXYCODONE HCL 5 MG PO TABS
ORAL_TABLET | ORAL | Status: AC
  Filled 2019-01-31: qty 1

## 2019-01-31 MED ORDER — HEPARIN (PORCINE) IN NACL 1000-0.9 UT/500ML-% IV SOLN
INTRAVENOUS | Status: DC | PRN
  Administered 2019-01-31 (×2): 500 mL

## 2019-01-31 MED ORDER — SODIUM CHLORIDE 0.9 % IV SOLN: INTRAVENOUS | Status: AC

## 2019-01-31 MED ORDER — LABETALOL HCL 5 MG/ML IV SOLN: 10.0000 mg | INTRAVENOUS | Status: DC | PRN

## 2019-01-31 MED ORDER — CLOPIDOGREL BISULFATE 300 MG PO TABS
ORAL_TABLET | ORAL | Status: DC | PRN
  Administered 2019-01-31: 300 mg via ORAL

## 2019-01-31 MED ORDER — ACETAMINOPHEN 325 MG PO TABS: 650.0000 mg | ORAL_TABLET | ORAL | Status: DC | PRN

## 2019-01-31 MED ORDER — INSULIN ASPART 100 UNIT/ML ~~LOC~~ SOLN
0.0000 [IU] | Freq: Every day | SUBCUTANEOUS | Status: DC
  Administered 2019-01-31: 2 [IU] via SUBCUTANEOUS
  Administered 2019-02-01: 3 [IU] via SUBCUTANEOUS
  Filled 2019-01-31: qty 0.05

## 2019-01-31 MED ORDER — LIDOCAINE HCL (PF) 1 % IJ SOLN
INTRAMUSCULAR | Status: AC
  Filled 2019-01-31: qty 30

## 2019-01-31 MED ORDER — SIMVASTATIN 20 MG PO TABS
40.0000 mg | ORAL_TABLET | Freq: Every evening | ORAL | Status: DC
  Administered 2019-01-31 – 2019-02-01 (×2): 40 mg via ORAL
  Filled 2019-01-31 (×2): qty 2

## 2019-01-31 MED ORDER — FENTANYL CITRATE (PF) 100 MCG/2ML IJ SOLN
INTRAMUSCULAR | Status: AC
  Filled 2019-01-31: qty 2

## 2019-01-31 MED ORDER — ONDANSETRON HCL 4 MG/2ML IJ SOLN: 4.0000 mg | Freq: Four times a day (QID) | INTRAMUSCULAR | Status: DC | PRN

## 2019-01-31 MED ORDER — INSULIN ASPART 100 UNIT/ML ~~LOC~~ SOLN
4.0000 [IU] | Freq: Three times a day (TID) | SUBCUTANEOUS | Status: DC
  Administered 2019-01-31 – 2019-02-02 (×2): 4 [IU] via SUBCUTANEOUS

## 2019-01-31 MED ORDER — HEPARIN SODIUM (PORCINE) 1000 UNIT/ML IJ SOLN
INTRAMUSCULAR | Status: AC
  Filled 2019-01-31: qty 1

## 2019-01-31 MED ORDER — DULAGLUTIDE 0.75 MG/0.5ML ~~LOC~~ SOAJ: 0.7500 mg | SUBCUTANEOUS | Status: DC

## 2019-01-31 MED ORDER — MIDAZOLAM HCL 2 MG/2ML IJ SOLN
INTRAMUSCULAR | Status: AC
  Filled 2019-01-31: qty 2

## 2019-01-31 MED ORDER — SODIUM CHLORIDE 0.9 % IV SOLN
INTRAVENOUS | Status: DC
  Administered 2019-01-31: 08:00:00 via INTRAVENOUS

## 2019-01-31 MED ORDER — CLOPIDOGREL BISULFATE 75 MG PO TABS: 300.0000 mg | ORAL_TABLET | Freq: Once | ORAL | Status: AC

## 2019-01-31 MED ORDER — LIDOCAINE HCL (PF) 1 % IJ SOLN
INTRAMUSCULAR | Status: DC | PRN
  Administered 2019-01-31: 15 mL

## 2019-01-31 MED ORDER — CLOPIDOGREL BISULFATE 75 MG PO TABS
75.0000 mg | ORAL_TABLET | Freq: Every day | ORAL | Status: DC
  Administered 2019-02-01 – 2019-02-02 (×2): 75 mg via ORAL
  Filled 2019-01-31 (×3): qty 1

## 2019-01-31 MED ORDER — INSULIN ASPART 100 UNIT/ML ~~LOC~~ SOLN
0.0000 [IU] | Freq: Three times a day (TID) | SUBCUTANEOUS | Status: DC
  Administered 2019-01-31 – 2019-02-01 (×3): 3 [IU] via SUBCUTANEOUS
  Administered 2019-02-02: 5 [IU] via SUBCUTANEOUS
  Filled 2019-01-31: qty 0.15

## 2019-01-31 MED ORDER — OXYCODONE HCL 5 MG PO TABS
5.0000 mg | ORAL_TABLET | ORAL | Status: DC | PRN
  Administered 2019-01-31: 5 mg via ORAL
  Administered 2019-01-31 – 2019-02-02 (×9): 10 mg via ORAL
  Filled 2019-01-31 (×9): qty 2

## 2019-01-31 MED ORDER — HYDRALAZINE HCL 20 MG/ML IJ SOLN: 5.0000 mg | INTRAMUSCULAR | Status: DC | PRN

## 2019-01-31 MED ORDER — HEPARIN SODIUM (PORCINE) 1000 UNIT/ML IJ SOLN
INTRAMUSCULAR | Status: DC | PRN
  Administered 2019-01-31: 12000 [IU] via INTRAVENOUS

## 2019-01-31 MED ORDER — HEPARIN (PORCINE) IN NACL 1000-0.9 UT/500ML-% IV SOLN
INTRAVENOUS | Status: AC
  Filled 2019-01-31: qty 1000

## 2019-01-31 MED ORDER — MIDAZOLAM HCL 2 MG/2ML IJ SOLN
INTRAMUSCULAR | Status: DC | PRN
  Administered 2019-01-31: 1 mg via INTRAVENOUS

## 2019-01-31 MED ORDER — FENTANYL CITRATE (PF) 100 MCG/2ML IJ SOLN
INTRAMUSCULAR | Status: DC | PRN
  Administered 2019-01-31: 50 ug via INTRAVENOUS
  Administered 2019-01-31 (×2): 25 ug via INTRAVENOUS

## 2019-01-31 MED ORDER — SODIUM CHLORIDE 0.9% FLUSH: 3.0000 mL | INTRAVENOUS | Status: DC | PRN

## 2019-01-31 MED ORDER — ASPIRIN 81 MG PO CHEW
81.0000 mg | CHEWABLE_TABLET | Freq: Every day | ORAL | Status: DC
  Administered 2019-01-31 – 2019-02-02 (×3): 81 mg via ORAL
  Filled 2019-01-31 (×3): qty 1

## 2019-01-31 MED ORDER — HEPARIN SODIUM (PORCINE) 5000 UNIT/ML IJ SOLN
5000.0000 [IU] | Freq: Three times a day (TID) | INTRAMUSCULAR | Status: DC
  Administered 2019-01-31 – 2019-02-02 (×4): 5000 [IU] via SUBCUTANEOUS
  Filled 2019-01-31 (×4): qty 1

## 2019-01-31 MED ORDER — MORPHINE SULFATE (PF) 2 MG/ML IV SOLN
2.0000 mg | INTRAVENOUS | Status: DC | PRN
  Administered 2019-02-01: 2 mg via INTRAVENOUS
  Filled 2019-01-31 (×2): qty 1

## 2019-01-31 MED ORDER — SODIUM CHLORIDE 0.9% FLUSH
3.0000 mL | Freq: Two times a day (BID) | INTRAVENOUS | Status: DC
  Administered 2019-01-31 – 2019-02-01 (×2): 3 mL via INTRAVENOUS

## 2019-01-31 SURGICAL SUPPLY — 22 items
BALLN MUSTANG 6.0X40 135 (BALLOONS) ×3
BALLOON MUSTANG 6.0X40 135 (BALLOONS) ×2 IMPLANT
CATH OMNI FLUSH 5F 65CM (CATHETERS) ×3 IMPLANT
CATH QUICKCROSS SUPP .035X90CM (MICROCATHETER) ×3 IMPLANT
CLOSURE MYNX CONTROL 6F/7F (Vascular Products) ×3 IMPLANT
GLIDEWIRE ADV .035X260CM (WIRE) ×3 IMPLANT
HOVERMATT SINGLE USE (MISCELLANEOUS) ×3 IMPLANT
KIT ENCORE 26 ADVANTAGE (KITS) ×3 IMPLANT
KIT MICROPUNCTURE NIT STIFF (SHEATH) ×3 IMPLANT
KIT PV (KITS) ×3 IMPLANT
SHEATH HIGHFLEX ANSEL 6FRX55 (SHEATH) ×3 IMPLANT
SHEATH PINNACLE 5F 10CM (SHEATH) ×3 IMPLANT
SHEATH PINNACLE 6F 10CM (SHEATH) ×3 IMPLANT
SHEATH PROBE COVER 6X72 (BAG) ×3 IMPLANT
SHIELD RADPAD SCOOP 12X17 (MISCELLANEOUS) ×3 IMPLANT
STENT ELUVIA 6X40X130 (Permanent Stent) ×3 IMPLANT
STOPCOCK MORSE 400PSI 3WAY (MISCELLANEOUS) ×3 IMPLANT
SYR MEDRAD MARK 7 150ML (SYRINGE) ×3 IMPLANT
TRANSDUCER W/STOPCOCK (MISCELLANEOUS) ×3 IMPLANT
TRAY PV CATH (CUSTOM PROCEDURE TRAY) ×3 IMPLANT
TUBING CIL FLEX 10 FLL-RA (TUBING) ×3 IMPLANT
WIRE BENTSON .035X145CM (WIRE) ×3 IMPLANT

## 2019-01-31 NOTE — Plan of Care (Signed)
  Problem: Education: Goal: Knowledge of General Education information will improve Description Including pain rating scale, medication(s)/side effects and non-pharmacologic comfort measures Outcome: Progressing   Problem: Education: Goal: Knowledge of General Education information will improve Description Including pain rating scale, medication(s)/side effects and non-pharmacologic comfort measures Outcome: Progressing   

## 2019-01-31 NOTE — Op Note (Signed)
    Patient name: Derrick Clark MRN: [id number] DOB: 01/04/1961 Sex: male  01/31/2019 Pre-operative Diagnosis: Critical right lower extremity ischemia with toe ulceration Post-operative diagnosis:  Same Surgeon:  Erlene Quan C. Donzetta Matters, MD Procedure Performed: 1.  Ultrasound-guided cannulation left common femoral artery 2.  Aortogram with right lower extremity runoff 3.  Stent of right SFA with 6 x 40 mm Elluvia 4.  Mynx device closure left common femoral artery 5.  Moderate sedation with fentanyl and Versed for 42 minutes  Indications: 58 year old male with diabetes has recent low-grade trauma to his right foot causing necrosis of his right small toe.  He is now indicated for angiogram possible invention.  Findings: His aortoiliac segments were free of disease.  There was no flow-limiting stenosis in his common femoral artery on either side.  The left leg was not visualized given patient's size and palpable pedal pulses on that side.  The right SFA had a very focal 95% stenosis that was resolved to 0% after stenting.  There was runoff via 3 vessels to the ankle.  At completion was a palpable dorsalis pedis pulse distally on the foot.   Procedure:  The patient was identified in the holding area and taken to room 8.  The patient was then placed supine on the table and prepped and draped in the usual sterile fashion.  A time out was called.  Ultrasound was used to evaluate the left common femoral artery which was noted to be patent free of disease.  There is anesthetized 1% lidocaine cannulated directly with ultrasound visualization a micropuncture needle followed by wire sheath.  Image was saved the permanent record.  I placed a Bentson wire followed by 5 French sheath and Omni catheter level of L1.  Aortogram was performed.  We crossed the bifurcation of the right common femoral artery perform right lower extremity runoff with the above findings.  We then placed a long 6 French sheath patient was fully  heparinized.  We crossed the lesion with a Glidewire and quick cross catheter confirmed intraluminal access distally.  We then primary stented this lesion with drug-eluting stent.  Was postdilated with a 6 x 40 balloon.  Completion demonstrated no residual stenosis or dissection.  We also evaluated the runoff which appeared to be 3 vessels to the ankle.  Satisfied with this we exchanged for a short 6 [place] sheath deployed a minx device.  He was noted to have a palpable dorsalis pedis pulse.  He tolerated procedure without immediate complication.  Contrast: 70cc    C. Donzetta Matters, MD [facility] [place] Office: [phone] Pager: [phone]

## 2019-01-31 NOTE — H&P (Signed)
   History and Physical Update  The patient was interviewed and re-examined.  The patient's previous History and Physical has been reviewed and is unchanged from recent office visit. Plan for right lower extremity angiogram with possible intervention.   Keyasia Jolliff C. Donzetta Matters, MD [facility] [place] Office: [phone] Pager: [phone]  01/31/2019, 7:23 AM

## 2019-02-01 ENCOUNTER — Inpatient Hospital Stay (HOSPITAL_COMMUNITY): Payer: BLUE CROSS/BLUE SHIELD | Admitting: Certified Registered"

## 2019-02-01 ENCOUNTER — Encounter (HOSPITAL_COMMUNITY): Payer: Self-pay | Admitting: Vascular Surgery

## 2019-02-01 ENCOUNTER — Encounter (HOSPITAL_COMMUNITY)
Admission: AD | Disposition: A | Discharge status: Home / Self Care | Payer: Self-pay | Source: Home / Self Care | Attending: Vascular Surgery

## 2019-02-01 DIAGNOSIS — Z6841 Body Mass Index (BMI) 40.0 and over, adult: Secondary | ICD-10-CM | POA: Diagnosis not present

## 2019-02-01 DIAGNOSIS — Z7984 Long term (current) use of oral hypoglycemic drugs: Secondary | ICD-10-CM | POA: Diagnosis not present

## 2019-02-01 DIAGNOSIS — I491 Atrial premature depolarization: Secondary | ICD-10-CM | POA: Diagnosis not present

## 2019-02-01 DIAGNOSIS — I739 Peripheral vascular disease, unspecified: Secondary | ICD-10-CM | POA: Diagnosis present

## 2019-02-01 DIAGNOSIS — E1122 Type 2 diabetes mellitus with diabetic chronic kidney disease: Secondary | ICD-10-CM | POA: Diagnosis not present

## 2019-02-01 DIAGNOSIS — E1152 Type 2 diabetes mellitus with diabetic peripheral angiopathy with gangrene: Secondary | ICD-10-CM | POA: Diagnosis not present

## 2019-02-01 DIAGNOSIS — Z882 Allergy status to sulfonamides status: Secondary | ICD-10-CM | POA: Diagnosis not present

## 2019-02-01 DIAGNOSIS — M1990 Unspecified osteoarthritis, unspecified site: Secondary | ICD-10-CM | POA: Diagnosis not present

## 2019-02-01 DIAGNOSIS — Z888 Allergy status to other drugs, medicaments and biological substances status: Secondary | ICD-10-CM | POA: Diagnosis not present

## 2019-02-01 DIAGNOSIS — Z7982 Long term (current) use of aspirin: Secondary | ICD-10-CM | POA: Diagnosis not present

## 2019-02-01 DIAGNOSIS — Z452 Encounter for adjustment and management of vascular access device: Secondary | ICD-10-CM | POA: Diagnosis not present

## 2019-02-01 DIAGNOSIS — L97519 Non-pressure chronic ulcer of other part of right foot with unspecified severity: Secondary | ICD-10-CM | POA: Diagnosis not present

## 2019-02-01 DIAGNOSIS — N186 End stage renal disease: Secondary | ICD-10-CM | POA: Diagnosis not present

## 2019-02-01 DIAGNOSIS — I70261 Atherosclerosis of native arteries of extremities with gangrene, right leg: Secondary | ICD-10-CM | POA: Diagnosis not present

## 2019-02-01 DIAGNOSIS — E11621 Type 2 diabetes mellitus with foot ulcer: Secondary | ICD-10-CM | POA: Diagnosis not present

## 2019-02-01 DIAGNOSIS — Z79899 Other long term (current) drug therapy: Secondary | ICD-10-CM | POA: Diagnosis not present

## 2019-02-01 HISTORY — PX: AMPUTATION: SHX166

## 2019-02-01 LAB — GLUCOSE, CAPILLARY
GLUCOSE-CAPILLARY: 253 mg/dL — AB (ref 70–99)
Glucose-Capillary: 181 mg/dL — ABNORMAL HIGH (ref 70–99)
Glucose-Capillary: 169 mg/dL — ABNORMAL HIGH (ref 70–99)
Glucose-Capillary: 151 mg/dL — ABNORMAL HIGH (ref 70–99)
Glucose-Capillary: 118 mg/dL — ABNORMAL HIGH (ref 70–99)

## 2019-02-01 LAB — BASIC METABOLIC PANEL
Anion gap: 9 (ref 5–15)
BUN: 14 mg/dL (ref 6–20)
CO2: 26 mmol/L (ref 22–32)
Calcium: 8.8 mg/dL — ABNORMAL LOW (ref 8.9–10.3)
Chloride: 102 mmol/L (ref 98–111)
Creatinine, Ser: 0.73 mg/dL (ref 0.61–1.24)
GFR calc Af Amer: >60 mL/min (ref >60)
Glucose, Bld: 179 mg/dL — ABNORMAL HIGH (ref 70–99)
Potassium: 4.1 mmol/L (ref 3.5–5.1)
Sodium: 137 mmol/L (ref 135–145)

## 2019-02-01 LAB — CBC
HCT: 41 % (ref 39.0–52.0)
Hemoglobin: 13.8 g/dL (ref 13.0–17.0)
MCH: 30.4 pg (ref 26.0–34.0)
MCHC: 33.7 g/dL (ref 30.0–36.0)
MCV: 90.3 fL (ref 80.0–100.0)
Platelets: 234 10*3/uL (ref 150–400)
RBC: 4.54 MIL/uL (ref 4.22–5.81)
RDW: 11.6 % (ref 11.5–15.5)
WBC: 11.7 10*3/uL — ABNORMAL HIGH (ref 4.0–10.5)
nRBC: 0 % (ref 0.0–0.2)

## 2019-02-01 LAB — MRSA PCR SCREENING: MRSA by PCR: NEGATIVE

## 2019-02-01 SURGERY — AMPUTATION DIGIT
Anesthesia: General | Laterality: Right

## 2019-02-01 MED ORDER — PROTAMINE SULFATE 10 MG/ML IV SOLN
INTRAVENOUS | Status: AC
  Filled 2019-02-01: qty 5

## 2019-02-01 MED ORDER — DEXAMETHASONE SODIUM PHOSPHATE 10 MG/ML IJ SOLN
INTRAMUSCULAR | Status: AC
  Filled 2019-02-01: qty 1

## 2019-02-01 MED ORDER — PROPOFOL 10 MG/ML IV BOLUS
INTRAVENOUS | Status: DC | PRN
  Administered 2019-02-01: 200 mg via INTRAVENOUS

## 2019-02-01 MED ORDER — DEXAMETHASONE SODIUM PHOSPHATE 10 MG/ML IJ SOLN
INTRAMUSCULAR | Status: DC | PRN
  Administered 2019-02-01: 4 mg via INTRAVENOUS

## 2019-02-01 MED ORDER — CEFAZOLIN SODIUM-DEXTROSE 2-3 GM-%(50ML) IV SOLR
INTRAVENOUS | Status: DC | PRN
  Administered 2019-02-01: 2 g via INTRAVENOUS

## 2019-02-01 MED ORDER — 0.9 % SODIUM CHLORIDE (POUR BTL) OPTIME
TOPICAL | Status: DC | PRN
  Administered 2019-02-01: 1000 mL

## 2019-02-01 MED ORDER — SODIUM CHLORIDE 0.9 % IV SOLN
INTRAVENOUS | Status: DC
  Administered 2019-02-01: 15:00:00 via INTRAVENOUS

## 2019-02-01 MED ORDER — FENTANYL CITRATE (PF) 250 MCG/5ML IJ SOLN
INTRAMUSCULAR | Status: AC
  Filled 2019-02-01: qty 5

## 2019-02-01 MED ORDER — FENTANYL CITRATE (PF) 100 MCG/2ML IJ SOLN: 25.0000 ug | INTRAMUSCULAR | Status: DC | PRN

## 2019-02-01 MED ORDER — PROMETHAZINE HCL 25 MG/ML IJ SOLN: 6.2500 mg | INTRAMUSCULAR | Status: DC | PRN

## 2019-02-01 MED ORDER — HYDROMORPHONE HCL 1 MG/ML IJ SOLN
0.5000 mg | INTRAMUSCULAR | Status: DC | PRN
  Administered 2019-02-01 – 2019-02-02 (×2): 1 mg via INTRAVENOUS
  Filled 2019-02-01 (×2): qty 1

## 2019-02-01 MED ORDER — ONDANSETRON HCL 4 MG/2ML IJ SOLN
INTRAMUSCULAR | Status: AC
  Filled 2019-02-01: qty 2

## 2019-02-01 MED ORDER — BACITRACIN ZINC 500 UNIT/GM EX OINT
TOPICAL_OINTMENT | CUTANEOUS | Status: DC | PRN
  Administered 2019-02-01: 1 via TOPICAL

## 2019-02-01 MED ORDER — CEFAZOLIN SODIUM 1 G IJ SOLR
INTRAMUSCULAR | Status: AC
  Filled 2019-02-01: qty 20

## 2019-02-01 MED ORDER — MIDAZOLAM HCL 2 MG/2ML IJ SOLN
INTRAMUSCULAR | Status: DC | PRN
  Administered 2019-02-01: 2 mg via INTRAVENOUS

## 2019-02-01 MED ORDER — MIDAZOLAM HCL 2 MG/2ML IJ SOLN
INTRAMUSCULAR | Status: AC
  Filled 2019-02-01: qty 2

## 2019-02-01 MED ORDER — LIDOCAINE 2% (20 MG/ML) 5 ML SYRINGE
INTRAMUSCULAR | Status: AC
  Filled 2019-02-01: qty 5

## 2019-02-01 MED ORDER — PROPOFOL 10 MG/ML IV BOLUS
INTRAVENOUS | Status: AC
  Filled 2019-02-01: qty 20

## 2019-02-01 MED ORDER — FENTANYL CITRATE (PF) 250 MCG/5ML IJ SOLN
INTRAMUSCULAR | Status: DC | PRN
  Administered 2019-02-01 (×2): 50 ug via INTRAVENOUS

## 2019-02-01 MED ORDER — BACITRACIN ZINC 500 UNIT/GM EX OINT
TOPICAL_OINTMENT | CUTANEOUS | Status: AC
  Filled 2019-02-01: qty 28.35

## 2019-02-01 MED ORDER — OXYCODONE HCL 5 MG/5ML PO SOLN: 5.0000 mg | Freq: Once | ORAL | Status: DC | PRN

## 2019-02-01 MED ORDER — LIDOCAINE 2% (20 MG/ML) 5 ML SYRINGE
INTRAMUSCULAR | Status: DC | PRN
  Administered 2019-02-01: 80 mg via INTRAVENOUS

## 2019-02-01 MED ORDER — OXYCODONE HCL 5 MG PO TABS: 5.0000 mg | ORAL_TABLET | Freq: Once | ORAL | Status: DC | PRN

## 2019-02-01 MED ORDER — ONDANSETRON HCL 4 MG/2ML IJ SOLN
INTRAMUSCULAR | Status: DC | PRN
  Administered 2019-02-01: 4 mg via INTRAVENOUS

## 2019-02-01 SURGICAL SUPPLY — 31 items
BANDAGE ACE 4X5 VEL STRL LF (GAUZE/BANDAGES/DRESSINGS) ×2 IMPLANT
BANDAGE ELASTIC 4 VELCRO ST LF (GAUZE/BANDAGES/DRESSINGS) ×2 IMPLANT
BLADE AVERAGE 25X9 (BLADE) ×2 IMPLANT
BNDG GAUZE ELAST 4 BULKY (GAUZE/BANDAGES/DRESSINGS) ×2 IMPLANT
CANISTER SUCT 3000ML PPV (MISCELLANEOUS) ×2 IMPLANT
COVER SURGICAL LIGHT HANDLE (MISCELLANEOUS) ×2 IMPLANT
COVER WAND RF STERILE (DRAPES) ×2 IMPLANT
DRAPE EXTREMITY T 121X128X90 (DISPOSABLE) ×2 IMPLANT
DRAPE HALF SHEET 40X57 (DRAPES) ×2 IMPLANT
ELECT REM PT RETURN 9FT ADLT (ELECTROSURGICAL) ×2
ELECTRODE REM PT RTRN 9FT ADLT (ELECTROSURGICAL) ×1 IMPLANT
GAUZE SPONGE 4X4 12PLY STRL (GAUZE/BANDAGES/DRESSINGS) ×2 IMPLANT
GLOVE BIO SURGEON STRL SZ 6.5 (GLOVE) ×2 IMPLANT
GLOVE BIO SURGEON STRL SZ7.5 (GLOVE) ×2 IMPLANT
GLOVE BIOGEL PI IND STRL 6.5 (GLOVE) ×2 IMPLANT
GLOVE BIOGEL PI IND STRL 7.5 (GLOVE) ×1 IMPLANT
GLOVE BIOGEL PI IND STRL 8 (GLOVE) ×1 IMPLANT
GLOVE BIOGEL PI INDICATOR 6.5 (GLOVE) ×2
GLOVE BIOGEL PI INDICATOR 7.5 (GLOVE) ×1
GLOVE BIOGEL PI INDICATOR 8 (GLOVE) ×1
GOWN STRL REUS W/ TWL LRG LVL3 (GOWN DISPOSABLE) ×3 IMPLANT
GOWN STRL REUS W/TWL LRG LVL3 (GOWN DISPOSABLE) ×3
KIT BASIN OR (CUSTOM PROCEDURE TRAY) ×2 IMPLANT
KIT TURNOVER KIT B (KITS) ×2 IMPLANT
NS IRRIG 1000ML POUR BTL (IV SOLUTION) ×2 IMPLANT
PACK GENERAL/GYN (CUSTOM PROCEDURE TRAY) ×2 IMPLANT
PAD ARMBOARD 7.5X6 YLW CONV (MISCELLANEOUS) ×4 IMPLANT
SUT ETHILON 3 0 PS 1 (SUTURE) ×2 IMPLANT
TOWEL GREEN STERILE (TOWEL DISPOSABLE) ×4 IMPLANT
UNDERPAD 30X30 (UNDERPADS AND DIAPERS) ×2 IMPLANT
WATER STERILE IRR 1000ML POUR (IV SOLUTION) ×2 IMPLANT

## 2019-02-01 NOTE — Anesthesia Preprocedure Evaluation (Addendum)
Anesthesia Evaluation  Patient identified by MRN, date of birth, ID band Patient awake    Reviewed: Allergy & Precautions, NPO status , Patient's Chart, lab work & pertinent test results  History of Anesthesia Complications Negative for: history of anesthetic complications  Airway Mallampati: II  TM Distance: >3 FB Neck ROM: Full    Dental  (+) Dental Advisory Given   Pulmonary Current Smoker,    breath sounds clear to auscultation       Cardiovascular + Peripheral Vascular Disease   Rhythm:Regular Rate:Normal     Neuro/Psych negative neurological ROS  negative psych ROS   GI/Hepatic negative GI ROS, Neg liver ROS,   Endo/Other  diabetes, Type 2, Oral Hypoglycemic AgentsMorbid obesity  Renal/GU negative Renal ROS     Musculoskeletal  (+) Arthritis ,   Abdominal (+) + obese,   Peds  Hematology negative hematology ROS (+)   Anesthesia Other Findings   Reproductive/Obstetrics                            Anesthesia Physical Anesthesia Plan  ASA: III  Anesthesia Plan: General   Post-op Pain Management:    Induction: Intravenous  PONV Risk Score and Plan: 3 and Treatment may vary due to age or medical condition, Ondansetron and Dexamethasone  Airway Management Planned: LMA  Additional Equipment: None  Intra-op Plan:   Post-operative Plan: Extubation in OR  Informed Consent: I have reviewed the patients History and Physical, chart, labs and discussed the procedure including the risks, benefits and alternatives for the proposed anesthesia with the patient or authorized representative who has indicated his/her understanding and acceptance.     Dental advisory given  Plan Discussed with: CRNA and Anesthesiologist  Anesthesia Plan Comments:        Anesthesia Quick Evaluation

## 2019-02-01 NOTE — Op Note (Signed)
    NAME: Derrick Clark    MRN: [id number] DOB: 16-Apr-1961    DATE OF OPERATION: 02/01/2019  PREOP DIAGNOSIS:    End-stage renal disease  POSTOP DIAGNOSIS:    Same  PROCEDURE:    Ultrasound-guided placement of left IJ tunneled dialysis catheter  SURGEON: Judeth Cornfield. Scot Dock, MD, FACS  ASSIST: None  ANESTHESIA: General  EBL: Minimal  INDICATIONS:    Derrick Clark is a 58 y.o. male who was in the operating room ready for surgery.  Dr. Donzetta Matters had to go to the emergency so I entered the room to place the tunneled dialysis catheter.   FINDINGS:   The right IJ was small.  I therefore selected a left-sided approach.  TECHNIQUE:   The patient was taken to the operating room and received a general anesthetic.  The neck upper chest and the left arm were prepped and draped in usual sterile fashion.  I had looked with the SonoSite at the IJ's preop and the right IJ appeared small therefore I selected a left-sided approach.  Under ultrasound guidance, with the patient in Trendelenburg.  The left IJ was cannulated and a guidewire introduced into the superior vena cava under fluoroscopic control.  The tract over the wire was dilated.  The dilator and peel-away sheath were advanced over the wire and the wire and dilator removed.  The catheter was passed through the peel-away sheath and positioned in the right atrium.  The exit site for the cath was selected and the skin anesthetized between the 2 areas.  The cath was brought through the tunnel cut to the appropriate length and the distal ports were attached.  Both ports withdrew easily with and flushed with heparinized saline and filled with concentrated heparin.  The catheter was secured at its exit site with a 3-0 nylon suture.  The IJ cannulation site was closed with a 4-0 subcuticular stitch.  Sterile dressing was applied.  The remainder of the dictation is as per Dr. Donzetta Matters.  Deitra Mayo, MD, FACS [facility]  [facility]  DATE OF DICTATION:   02/01/2019

## 2019-02-01 NOTE — Op Note (Signed)
    NAME: ZARIUS FURR    MRN: [id number] DOB: January 23, 1961    DATE OF OPERATION: 02/01/2019  PREOP DIAGNOSIS:    Gangrene of the right fifth toe  POSTOP DIAGNOSIS:    Same  PROCEDURE:   Amputation right fifth toe  SURGEON: Judeth Cornfield. Scot Dock, MD, FACS  ASSIST: None  ANESTHESIA: General  EBL: Minimal  INDICATIONS:    RAMIZ TURPIN is a 58 y.o. male with a gangrenous right fifth toe.  He underwent an endovascular intervention yesterday and presents for right fifth toe amputation.  FINDINGS:   Good bleeding at the toe amputation site.  TECHNIQUE:   The patient was taken to the operating room and received a general anesthetic.  The right foot was prepped and draped in usual sterile fashion.  Incision was made encompassing the fifth toe and the dissection carried down to the proximal phalanx.  The periosteum was elevated.  The bone was divided with a CD4 saw.  Hemostasis was obtained using electrocautery.  The wound was irrigated.  There was no signs of infection.  The wound was then closed with 4 interrupted 3-0 nylon sutures.  A sterile dressing was applied.  The patient tolerated the procedure well was transferred to the recovery room in stable condition.  All needle and sponge counts were correct.  Deitra Mayo, MD, FACS [facility]  DATE OF DICTATION:   02/01/2019

## 2019-02-01 NOTE — Progress Notes (Signed)
RN verified the presence of a signed informed consent that matches stated procedure by patient. Verified armband matches patient's stated name and birth date. Verified NPO status and that all jewelry, contact, glasses, dentures, and partials had been removed (if applicable).  

## 2019-02-01 NOTE — Anesthesia Procedure Notes (Signed)
Procedure Name: LMA Insertion Date/Time: 02/01/2019 4:34 PM Performed by: Bryson Corona, CRNA Pre-anesthesia Checklist: Patient identified, Emergency Drugs available, Suction available and Patient being monitored Patient Re-evaluated:Patient Re-evaluated prior to induction Oxygen Delivery Method: Circle System Utilized Preoxygenation: Pre-oxygenation with 100% oxygen Induction Type: IV induction Ventilation: Mask ventilation without difficulty LMA: LMA inserted LMA Size: 5.0 Number of attempts: 1 Placement Confirmation: positive ETCO2 Tube secured with: Tape Dental Injury: Teeth and Oropharynx as per pre-operative assessment

## 2019-02-01 NOTE — Interval H&P Note (Signed)
History and Physical Interval Note:  02/01/2019 4:02 PM  Derrick Clark  has presented today for surgery, with the diagnosis of ISCHEMIC RIGHT FIFTH TOE  The various methods of treatment have been discussed with the patient and family. After consideration of risks, benefits and other options for treatment, the patient has consented to  Procedure(s): AMPUTATION RIGHT FIFTH TOE (Right) as a surgical intervention .  The patient's history has been reviewed, patient examined, no change in status, stable for surgery.  I have reviewed the patient's chart and labs.  Questions were answered to the patient's satisfaction.     Deitra Mayo

## 2019-02-01 NOTE — Progress Notes (Addendum)
  Progress Note    02/01/2019 4:59 PM Day of Surgery  Subjective: He was doing well this morning  Vitals:   02/01/19 0315 02/01/19 1222  BP: 129/61 136/77  Pulse: 70 74  Resp: 18 15  Temp: 98.3 F (36.8 C) 97.6 F (36.4 C)  SpO2: 93% 97%    Physical Exam: Awake alert oriented Left groin soft without hematoma Palpable right dorsalis pedis pulse Stable gangrenous changes right fifth toe   CBC    Component Value Date/Time   WBC 11.7 (H) 02/01/2019 0326   RBC 4.54 02/01/2019 0326   HGB 13.8 02/01/2019 0326   HCT 41.0 02/01/2019 0326   PLT 234 02/01/2019 0326   MCV 90.3 02/01/2019 0326   MCH 30.4 02/01/2019 0326   MCHC 33.7 02/01/2019 0326   RDW 11.6 02/01/2019 0326    BMET    Component Value Date/Time   NA 137 02/01/2019 0326   K 4.1 02/01/2019 0326   CL 102 02/01/2019 0326   CO2 26 02/01/2019 0326   GLUCOSE 179 (H) 02/01/2019 0326   BUN 14 02/01/2019 0326   CREATININE 0.73 02/01/2019 0326   CALCIUM 8.8 (L) 02/01/2019 0326   GFRNONAA >60 02/01/2019 0326   GFRAA >60 02/01/2019 0326    INR No results found for: INR  No intake or output data in the 24 hours ending 02/01/19 1659   Assessment:  58 y.o. male is s/p stent of right SFA  Plan: OR today with Dr. Scot Dock for right fifth toe amputation   Zanden Colver C. Donzetta Matters, MD [facility] [place] Office: [phone] Pager: [phone]  02/01/2019 4:59 PM

## 2019-02-01 NOTE — Transfer of Care (Signed)
Immediate Anesthesia Transfer of Care Note  Patient: Derrick Clark  Procedure(s) Performed: AMPUTATION RIGHT FIFTH TOE (Right )  Patient Location: PACU  Anesthesia Type:General  Level of Consciousness: awake and patient cooperative  Airway & Oxygen Therapy: Patient Spontanous Breathing and Patient connected to nasal cannula oxygen  Post-op Assessment: Report given to RN and Patient moving all extremities  Post vital signs: Reviewed and stable  Last Vitals:  Vitals Value Taken Time  BP    Temp 36.2 C 02/01/2019  5:05 PM  Pulse 78 02/01/2019  5:06 PM  Resp 11 02/01/2019  5:06 PM  SpO2 99 % 02/01/2019  5:06 PM  Vitals shown include unvalidated device data.  Last Pain:  Vitals:   02/01/19 1705  TempSrc:   PainSc: Asleep      Patients Stated Pain Goal: 3 (50/53/97 6734)  Complications: No apparent anesthesia complications

## 2019-02-01 NOTE — Plan of Care (Signed)

## 2019-02-02 ENCOUNTER — Telehealth: Payer: Self-pay | Admitting: Vascular Surgery

## 2019-02-02 ENCOUNTER — Encounter (HOSPITAL_COMMUNITY): Payer: Self-pay | Admitting: Vascular Surgery

## 2019-02-02 DIAGNOSIS — M1990 Unspecified osteoarthritis, unspecified site: Secondary | ICD-10-CM | POA: Diagnosis not present

## 2019-02-02 DIAGNOSIS — I70261 Atherosclerosis of native arteries of extremities with gangrene, right leg: Secondary | ICD-10-CM | POA: Diagnosis not present

## 2019-02-02 DIAGNOSIS — I491 Atrial premature depolarization: Secondary | ICD-10-CM | POA: Diagnosis not present

## 2019-02-02 DIAGNOSIS — Z7982 Long term (current) use of aspirin: Secondary | ICD-10-CM | POA: Diagnosis not present

## 2019-02-02 DIAGNOSIS — Z79899 Other long term (current) drug therapy: Secondary | ICD-10-CM | POA: Diagnosis not present

## 2019-02-02 DIAGNOSIS — Z882 Allergy status to sulfonamides status: Secondary | ICD-10-CM | POA: Diagnosis not present

## 2019-02-02 DIAGNOSIS — Z888 Allergy status to other drugs, medicaments and biological substances status: Secondary | ICD-10-CM | POA: Diagnosis not present

## 2019-02-02 DIAGNOSIS — Z6841 Body Mass Index (BMI) 40.0 and over, adult: Secondary | ICD-10-CM | POA: Diagnosis not present

## 2019-02-02 DIAGNOSIS — Z7984 Long term (current) use of oral hypoglycemic drugs: Secondary | ICD-10-CM | POA: Diagnosis not present

## 2019-02-02 DIAGNOSIS — N186 End stage renal disease: Secondary | ICD-10-CM | POA: Diagnosis not present

## 2019-02-02 DIAGNOSIS — E1122 Type 2 diabetes mellitus with diabetic chronic kidney disease: Secondary | ICD-10-CM | POA: Diagnosis not present

## 2019-02-02 DIAGNOSIS — L97519 Non-pressure chronic ulcer of other part of right foot with unspecified severity: Secondary | ICD-10-CM | POA: Diagnosis not present

## 2019-02-02 DIAGNOSIS — E1152 Type 2 diabetes mellitus with diabetic peripheral angiopathy with gangrene: Secondary | ICD-10-CM | POA: Diagnosis not present

## 2019-02-02 DIAGNOSIS — E11621 Type 2 diabetes mellitus with foot ulcer: Secondary | ICD-10-CM | POA: Diagnosis not present

## 2019-02-02 LAB — GLUCOSE, CAPILLARY: Glucose-Capillary: 210 mg/dL — ABNORMAL HIGH (ref 70–99)

## 2019-02-02 MED ORDER — CLOPIDOGREL BISULFATE 75 MG PO TABS: 75.0000 mg | ORAL_TABLET | Freq: Every day | ORAL | 2 refills | Status: AC

## 2019-02-02 MED ORDER — OXYCODONE-ACETAMINOPHEN 5-325 MG PO TABS: 1.0000 | ORAL_TABLET | Freq: Four times a day (QID) | ORAL | 0 refills | Status: DC | PRN

## 2019-02-02 NOTE — Progress Notes (Addendum)
[facility] [place]  Subjective  - Comfortable this am.   Objective (!) 106/58 84 97.6 F (36.4 C) (Oral) 16 92%  Intake/Output Summary (Last 24 hours) at 02/02/2019 0729 Last data filed at 02/02/2019 0406 Gross per 24 hour  Intake 570 ml  Output 390 ml  Net 180 ml    Palpable right AT, dressing clean and dry Left groin soft without hematoma   Assessment/Planning: S/P stent of right SFA, followed by amputation of right 5th toe.  Stable disposition for discharge home  F/U in 2-3 weeks with Dr. Jeanice Lim Gerri Lins 02/02/2019 7:29 AM -- I have interviewed the patient and examined the patient. I agree with the findings by the PA.  Gae Gallop, MD [phone]  I have interviewed the patient and examined the patient. I agree with the findings by the PA. Home today.   Gae Gallop, MD [phone]    Laboratory Lab Results: Recent Labs    01/31/19 1547 02/01/19 0326  WBC 9.0 11.7*  HGB 14.1 13.8  HCT 40.6 41.0  PLT 244 234   BMET Recent Labs    01/31/19 0801  01/31/19 1547 02/01/19 0326  NA 138  --   --  137  K 4.3  --   --  4.1  CL  --   --   --  102  CO2  --   --   --  26  GLUCOSE 181*  --   --  179*  BUN  --   --   --  14  CREATININE  --    < > 0.82 0.73  CALCIUM  --   --   --  8.8*   < > = values in this interval not displayed.    COAG No results found for: INR, PROTIME No results found for: PTT

## 2019-02-02 NOTE — Progress Notes (Signed)
Discharge instructions given to Derrick Clark, discussed follow up appointment, new medications and medications changes.  Discussed signs and symptoms to watch for and when to contact the doctor. Verbalized understanding.

## 2019-02-02 NOTE — Plan of Care (Signed)
Pt is adequate for discharge. 

## 2019-02-02 NOTE — Telephone Encounter (Signed)
sch appt spk to pt wife mld ltr 02/17/2019 2pm ABI 3pm RLE 02/18/2019 145pm p/o MD

## 2019-02-02 NOTE — Progress Notes (Signed)
Orthopedic Tech Progress Note Patient Details:  Derrick Clark 24-Jul-1961 [id number]  Ortho Devices Type of Ortho Device: Darco shoe Ortho Device/Splint Location: right foot Ortho Device/Splint Interventions: Adjustment, Application, Ordered   Post Interventions Patient Tolerated: Well Instructions Provided: Care of device, Adjustment of device   [name] 02/02/2019, 10:49 AM

## 2019-02-02 NOTE — Discharge Instructions (Signed)
° °  [facility] of  ° °Discharge Instructions ° °Lower Extremity Angiogram; Angioplasty/Stenting ° °Please refer to the following instructions for your post-procedure care. Your surgeon or physician assistant will discuss any changes with you. ° °Activity ° °Avoid lifting more than 8 pounds (1 gallons of milk) for 72 hours (3 days) after your procedure. You may walk as much as you can tolerate. It's OK to drive after 72 hours. ° °Bathing/Showering ° °You may shower the day after your procedure. If you have a bandage, you may remove it at 24- 48 hours. Clean your incision site with mild soap and water. Pat the area dry with a clean towel. ° °Diet ° °Resume your pre-procedure diet. There are no special food restrictions following this procedure. All patients with peripheral vascular disease should follow a low fat/low cholesterol diet. In order to heal from your surgery, it is CRITICAL to get adequate nutrition. Your body requires vitamins, minerals, and protein. Vegetables are the best source of vitamins and minerals. Vegetables also provide the perfect balance of protein. Processed food has little nutritional value, so try to avoid this. ° °Medications ° °Resume taking all of your medications unless your doctor tells you not to. If your incision is causing pain, you may take over-the-counter pain relievers such as acetaminophen (Tylenol) ° °Follow Up ° °Follow up will be arranged at the time of your procedure. You may have an office visit scheduled or may be scheduled for surgery. Ask your surgeon if you have any questions. ° °Please call us immediately for any of the following conditions: °•Severe or worsening pain your legs or feet at rest or with walking. °•Increased pain, redness, drainage at your groin puncture site. °•Fever of 101 degrees or higher. °•If you have any mild or slow bleeding from your puncture site: lie down, apply firm constant pressure over the area with a piece of  gauze or a clean wash cloth for 30 minutes- no peeking!, call 911 right away if you are still bleeding after 30 minutes, or if the bleeding is heavy and unmanageable. ° °Reduce your risk factors of vascular disease: ° °Stop smoking. If you would like help call [facility] at 1-800-QUIT-NOW (1-800-784-8669) or  at 336-586-4000. °Manage your cholesterol °Maintain a desired weight °Control your diabetes °Keep your blood pressure down ° °If you have any questions, please call the office at [phone] ° °

## 2019-02-02 NOTE — Telephone Encounter (Signed)
-----   Message from Ulyses Amor, [place] sent at 02/02/2019  7:35 AM EST -----  S/P right SFA stent and right 5th toe amp.  Dr. Donzetta Matters in 2-3 weeks f/u ABI's and right arterial duplex

## 2019-02-03 NOTE — Anesthesia Postprocedure Evaluation (Signed)
Anesthesia Post Note  Patient: Derrick Clark  Procedure(s) Performed: AMPUTATION RIGHT FIFTH TOE (Right )     Patient location during evaluation: PACU Anesthesia Type: General Level of consciousness: awake and alert Pain management: pain level controlled Vital Signs Assessment: post-procedure vital signs reviewed and stable Respiratory status: spontaneous breathing, nonlabored ventilation, respiratory function stable and patient connected to nasal cannula oxygen Cardiovascular status: blood pressure returned to baseline and stable Postop Assessment: no apparent nausea or vomiting Anesthetic complications: no    Last Vitals:  Vitals:   02/02/19 0403 02/02/19 0800  BP: (!) 106/58 (!) 142/98  Pulse: 84   Resp: 16 17  Temp: 36.4 C 36.8 C  SpO2: 92% 95%    Last Pain:  Vitals:   02/02/19 0800  TempSrc: Oral  PainSc: [place]

## 2019-02-03 NOTE — Discharge Summary (Signed)
[facility] Discharge Summary   Patient ID:  Derrick Clark MRN: [id number] DOB/AGE: 1961/03/10 58 y.o.  Admit date: 01/31/2019 Discharge date: 02/02/2019 Date of Surgery: 02/01/2019 Surgeon: Surgeon(s): Angelia Mould, MD  Admission Diagnosis: Peripheral arterial disease [facility]) [I73.9]  Discharge Diagnoses:  Peripheral arterial disease ([place]) [I73.9]  Secondary Diagnoses: Past Medical History:  Diagnosis Date  . Arthritis   . Diabetes mellitus without complication ([place])   . PAD (peripheral artery disease) ([place])     Procedure(s): AMPUTATION RIGHT FIFTH TOE  Discharged Condition: stable  HPI: Derrick Clark is a 58 y.o. male here today for follow-up.  Had been seen several weeks ago with ischemic changes of his fifth toe.  No invasive infection at that time.  Is seen today for follow-up.  Went noninvasive studies today at [facility].  He reports continued pain which keeps him awake at night.  No fevers.  He was scheduled for aortogram with LE run off and possible intervention.   Hospital Course:  Derrick Clark is a 58 y.o. male is S/P  Procedure(s): Stent of right SFA with 6 x 40 mm Elluvia by Dr. Donzetta Matters 01/31/2019 [place] by Dr. Scot Dock 02/01/2019 Post he had Palpable right AT Once his pain was controlled and with Stable disposition he was discharged home  F/U in 2-3 weeks with Dr. Donzetta Matters   Significant Diagnostic Studies: CBC Lab Results  Component Value Date   WBC 11.7 (H) 02/01/2019   HGB 13.8 02/01/2019   HCT 41.0 02/01/2019   MCV 90.3 02/01/2019   PLT 234 02/01/2019    BMET    Component Value Date/Time   NA 137 02/01/2019 0326   K 4.1 02/01/2019 0326   CL 102 02/01/2019 0326   CO2 26 02/01/2019 0326   GLUCOSE 179 (H) 02/01/2019 0326   BUN 14 02/01/2019 0326   CREATININE 0.73 02/01/2019 0326   CALCIUM 8.8 (L) 02/01/2019 0326   GFRNONAA >60 02/01/2019 0326   GFRAA >60 02/01/2019 0326   COAG No results found  for: INR, PROTIME   Disposition:  Discharge to :Home Discharge Instructions    Call MD for:  redness, tenderness, or signs of infection (pain, swelling, bleeding, redness, odor or green/yellow discharge around incision site)   Complete by:  As directed    Call MD for:  severe or increased pain, loss or decreased feeling  in affected limb(s)   Complete by:  As directed    Call MD for:  temperature >100.5   Complete by:  As directed    Discharge instructions   Complete by:  As directed    Keep dressing clean and dry for 24 hours from now then you may remove it and shower.  Dry dressing as needed after that.   Resume previous diet   Complete by:  As directed      Allergies as of 02/02/2019      Reactions   Farxiga [dapagliflozin] Rash   Jardiance [empagliflozin] Rash   Sulfa Antibiotics Rash      Medication List    TAKE these medications   aspirin 81 MG chewable tablet Chew 81 mg by mouth daily.   clopidogrel 75 MG tablet Commonly known as:  PLAVIX Take 1 tablet (75 mg total) by mouth daily with breakfast.   cyclobenzaprine 10 MG tablet Commonly known as:  FLEXERIL Take 10 mg by mouth 3 (three) times daily as needed for muscle spasms.   gabapentin 600 MG tablet  Commonly known as:  NEURONTIN Take 600 mg by mouth 3 (three) times daily as needed (pain).   glipiZIDE 10 MG 24 hr tablet Commonly known as:  GLUCOTROL XL Take 10 mg by mouth daily.   GOODYS BODY PAIN PO Take 1 packet by mouth daily as needed (pain).   metFORMIN 500 MG 24 hr tablet Commonly known as:  GLUCOPHAGE-XR Take 1,000 mg by mouth 2 (two) times daily.   oxyCODONE-acetaminophen 5-325 MG tablet Commonly known as:  PERCOCET/ROXICET Take 1 tablet by mouth every 6 (six) hours as needed for severe pain. What changed:  when to take this   simvastatin 40 MG tablet Commonly known as:  ZOCOR Take 40 mg by mouth every evening.   TRULICITY 8.40 RF/5.4HK Sopn Generic drug:  Dulaglutide Inject 0.75 mg into  the skin every Sunday.      Verbal and written Discharge instructions given to the patient. Wound care per Discharge AVS Follow-up Information    Waynetta Sandy, MD Follow up in 2 week(s).   Specialties:  Vascular Surgery, Cardiology Contact information: [address] [place] [address] [phone]           Signed: Roxy Horseman 02/03/2019, 2:45 PM

## 2019-02-05 ENCOUNTER — Other Ambulatory Visit: Payer: Self-pay | Admitting: Vascular Surgery

## 2019-02-05 MED ORDER — OXYCODONE-ACETAMINOPHEN 5-325 MG PO TABS: 1.0000 | ORAL_TABLET | Freq: Four times a day (QID) | ORAL | 0 refills | Status: DC | PRN

## 2019-02-05 MED ORDER — CEPHALEXIN 500 MG PO CAPS: 500.0000 mg | ORAL_CAPSULE | Freq: Three times a day (TID) | ORAL | 0 refills | Status: DC

## 2019-02-08 ENCOUNTER — Other Ambulatory Visit: Payer: Self-pay | Admitting: *Deleted

## 2019-02-08 ENCOUNTER — Ambulatory Visit (INDEPENDENT_AMBULATORY_CARE_PROVIDER_SITE_OTHER): Payer: BLUE CROSS/BLUE SHIELD | Admitting: Vascular Surgery

## 2019-02-08 ENCOUNTER — Encounter: Payer: Self-pay | Admitting: *Deleted

## 2019-02-08 ENCOUNTER — Other Ambulatory Visit: Payer: Self-pay

## 2019-02-08 ENCOUNTER — Encounter: Payer: Self-pay | Admitting: Vascular Surgery

## 2019-02-08 ENCOUNTER — Ambulatory Visit: Payer: BLUE CROSS/BLUE SHIELD | Admitting: Vascular Surgery

## 2019-02-08 VITALS — BP 126/78 | HR 87 | Temp 98.1°F | Resp 20 | Ht 70.0 in | Wt 296.0 lb

## 2019-02-08 DIAGNOSIS — I739 Peripheral vascular disease, unspecified: Secondary | ICD-10-CM

## 2019-02-08 MED ORDER — OXYCODONE-ACETAMINOPHEN 5-325 MG PO TABS: 1.0000 | ORAL_TABLET | Freq: Four times a day (QID) | ORAL | 0 refills | Status: DC | PRN

## 2019-02-08 NOTE — Progress Notes (Signed)
Patient name: Derrick Clark MRN: [id number] DOB: 28-May-1961 Sex: male  REASON FOR VISIT: Add on, non-healing right 5th toe amp  HPI: Derrick Clark is a 58 y.o. male with history of diabetes that presents for evaluation of nonhealing right fifth toe amp.  Patient previous underwent intervention by Dr. Donzetta Matters on 01/31/2019 with a stent of the right SFA for a 95% stenosis with three-vessel runoff and a palpable dorsalis pedis at the end of case.  Dr. Scot Dock then performed a right fifth toe amp on 02/01/2019.  He called this weekend stating his toe amp had turned red and was started on Keflex.  He had no consitutional symptoms like fevers and continues to deny that today.  He presents today for further evaluation.  Past Medical History:  Diagnosis Date  . Arthritis   . Diabetes mellitus without complication ([place])   . PAD (peripheral artery disease) ([place])     Past Surgical History:  Procedure Laterality Date  . ABDOMINAL AORTOGRAM W/LOWER EXTREMITY N/A 01/31/2019   Procedure: ABDOMINAL AORTOGRAM W/LOWER EXTREMITY;  Surgeon: Waynetta Sandy, MD;  Location: [facility];  Service: Cardiovascular;  Laterality: N/A;  . AMPUTATION Right 02/01/2019   Procedure: AMPUTATION RIGHT FIFTH TOE;  Surgeon: Angelia Mould, MD;  Location: [facility];  Service: Vascular;  Laterality: Right;  . PERIPHERAL VASCULAR INTERVENTION Right 01/31/2019   Procedure: PERIPHERAL VASCULAR INTERVENTION;  Surgeon: Waynetta Sandy, MD;  Location: [facility];  Service: Cardiovascular;  Laterality: Right;  SFA  . SHOULDER SURGERY Left     History reviewed. No pertinent family history.  SOCIAL HISTORY: Social History   Tobacco Use  . Smoking status: Current Every Day Smoker    Packs/day: 0.75    Years: 40.00    Pack years: 30.00    Types: Cigarettes  . Smokeless tobacco: Never Used  Substance Use Topics  . Alcohol use: No    Frequency: Never    Allergies  Allergen Reactions  . Farxiga  [Dapagliflozin] Rash  . Jardiance [Empagliflozin] Rash  . Sulfa Antibiotics Rash    Current Outpatient Medications  Medication Sig Dispense Refill  . aspirin 81 MG chewable tablet Chew 81 mg by mouth daily.     . Aspirin-Acetaminophen (GOODYS BODY PAIN PO) Take 1 packet by mouth daily as needed (pain).    . cephALEXin (KEFLEX) 500 MG capsule Take 1 capsule (500 mg total) by mouth 3 (three) times daily. 15 capsule 0  . clopidogrel (PLAVIX) 75 MG tablet Take 1 tablet (75 mg total) by mouth daily with breakfast. 30 tablet 2  . cyclobenzaprine (FLEXERIL) 10 MG tablet Take 10 mg by mouth 3 (three) times daily as needed for muscle spasms.    . Dulaglutide (TRULICITY) 2.63 ZC/5.8IF SOPN Inject 0.75 mg into the skin every Sunday.     . gabapentin (NEURONTIN) 600 MG tablet Take 600 mg by mouth 3 (three) times daily as needed (pain).   0  . glipiZIDE (GLUCOTROL XL) 10 MG 24 hr tablet Take 10 mg by mouth daily.    . metFORMIN (GLUCOPHAGE-XR) 500 MG 24 hr tablet Take 1,000 mg by mouth 2 (two) times daily.    Marland Kitchen oxyCODONE-acetaminophen (PERCOCET/ROXICET) 5-325 MG tablet Take 1 tablet by mouth every 6 (six) hours as needed for severe pain. 15 tablet 0  . simvastatin (ZOCOR) 40 MG tablet Take 40 mg by mouth every evening.      No current facility-administered medications for this visit.  REVIEW OF SYSTEMS:  [X]  denotes positive finding, [ ]  denotes negative finding Cardiac  Comments:  Chest pain or chest pressure:    Shortness of breath upon exertion:    Short of breath when lying flat:    Irregular heart rhythm:        Vascular    Pain in calf, thigh, or hip brought on by ambulation:    Pain in feet at night that wakes you up from your sleep:     Blood clot in your veins:    Leg swelling:         Pulmonary    Oxygen at home:    Productive cough:     Wheezing:         Neurologic    Sudden weakness in arms or legs:     Sudden numbness in arms or legs:     Sudden onset of difficulty  speaking or slurred speech:    Temporary loss of vision in one eye:     Problems with dizziness:         Gastrointestinal    Blood in stool:     Vomited blood:         Genitourinary    Burning when urinating:     Blood in urine:        Psychiatric    Major depression:         Hematologic    Bleeding problems:    Problems with blood clotting too easily:        Skin    Rashes or ulcers:        Constitutional    Fever or chills:      PHYSICAL EXAM: Vitals:   02/08/19 1542  BP: 126/78  Pulse: 87  Resp: 20  Temp: 98.1 F (36.7 C)  SpO2: 96%  Weight: 296 lb (134.3 kg)  Height: 5\' 10"  (1.778 m)    GENERAL: The patient is a well-nourished male, in no acute distress. The vital signs are documented above. VASCULAR: Palpable right dorsalis pedis pulse Necrotic right 5th toe amp as pictured below       DATA:   None  Assessment/Plan:  58 year old male that has undergone right SFA intervention with stent for a focal 95% stenosis with three-vessel runoff.  On exam today he has a necrotic nonhealing right fifth toe amp.  He still has a palpable right dorsalis pedis pulse and his revascularization remains patent.  He is adamant that he does not want a transmetatarsal amputation and discussed that ultimately he would be at risk for a TMA if this if this continues to progress.  As result we will plan for a right fifth toe ray amputation and take the bone further back and debridement on Thursday.  Discussed with patient that likely will leave the wound open with packing given overlying cellulitis.  He will continue the keflex.  He should be able to be discharged home same day.    Marty Heck, MD [facility] Office: [phone] Pager: [phone]

## 2019-02-09 ENCOUNTER — Other Ambulatory Visit: Payer: Self-pay

## 2019-02-09 ENCOUNTER — Encounter (HOSPITAL_COMMUNITY): Payer: Self-pay | Admitting: *Deleted

## 2019-02-09 MED ORDER — DEXTROSE 5 % IV SOLN
3.0000 g | INTRAVENOUS | Status: AC
  Administered 2019-02-10: 3 g via INTRAVENOUS
  Filled 2019-02-09: qty 3

## 2019-02-09 NOTE — Progress Notes (Signed)
   02/09/19 0957  OBSTRUCTIVE SLEEP APNEA  Have you ever been diagnosed with sleep apnea through a sleep study? No  Do you snore loudly (loud enough to be heard through closed doors)?  1  Do you often feel tired, fatigued, or sleepy during the daytime (such as falling asleep during driving or talking to someone)? 0  Has anyone observed you stop breathing during your sleep? 0  Do you have, or are you being treated for high blood pressure? 0  BMI more than 35 kg/m2? 1  Age > 50 (1-yes) 1  Neck circumference greater than:Male 16 inches or larger, Male 17inches or larger? 1  Male Gender (Yes=1) 1  Obstructive Sleep Apnea Score 5

## 2019-02-09 NOTE — Progress Notes (Signed)
Pt denies SOB, chest pain, and being under the care of a cardiologist. Pt denies having a stress test, echo and cardiac cath. Pt denies having a chest x ray within the last year. Pt made aware to stop taking vitamins, fish oil and herbal medications. Do not take any NSAIDs ie: Ibuprofen, Advil, Naproxen (Aleve), Motrin, BC and Goody Powder. Zigmund Daniel, Surgical Coordinator at [facility],  clarified that pt should take both Aspirin and Plavix DOS. Pt made aware to hold Glipizide starting this evening and no Metformin on DOS. Pt made aware to check BG every 2 hours prior to arrival to hospital on DOS. Pt made aware to treat a BG < 70 with  4 ounces of apple or cranberry juice, wait 15 minutes after intervention to recheck BG, if BG remains < 70, call Short Stay unit to speak with a nurse. Pt verbalized understanding of all pre-op instructions.

## 2019-02-10 ENCOUNTER — Inpatient Hospital Stay (HOSPITAL_COMMUNITY)
Admission: AD | Admit: 2019-02-10 | Discharge: 2019-02-12 | DRG: 504 | Disposition: A | Discharge status: Home Health | Payer: BLUE CROSS/BLUE SHIELD | Attending: Vascular Surgery | Admitting: Vascular Surgery

## 2019-02-10 ENCOUNTER — Encounter (HOSPITAL_COMMUNITY)
Admission: AD | Disposition: A | Discharge status: Home Health | Payer: Self-pay | Source: Home / Self Care | Attending: Vascular Surgery

## 2019-02-10 ENCOUNTER — Ambulatory Visit (HOSPITAL_COMMUNITY): Payer: BLUE CROSS/BLUE SHIELD | Admitting: Certified Registered"

## 2019-02-10 ENCOUNTER — Encounter (HOSPITAL_COMMUNITY): Payer: Self-pay

## 2019-02-10 DIAGNOSIS — Z882 Allergy status to sulfonamides status: Secondary | ICD-10-CM

## 2019-02-10 DIAGNOSIS — E1152 Type 2 diabetes mellitus with diabetic peripheral angiopathy with gangrene: Secondary | ICD-10-CM | POA: Diagnosis not present

## 2019-02-10 DIAGNOSIS — T8753 Necrosis of amputation stump, right lower extremity: Principal | ICD-10-CM | POA: Diagnosis present

## 2019-02-10 DIAGNOSIS — T8189XA Other complications of procedures, not elsewhere classified, initial encounter: Secondary | ICD-10-CM | POA: Diagnosis not present

## 2019-02-10 DIAGNOSIS — Z7984 Long term (current) use of oral hypoglycemic drugs: Secondary | ICD-10-CM | POA: Diagnosis not present

## 2019-02-10 DIAGNOSIS — I96 Gangrene, not elsewhere classified: Secondary | ICD-10-CM | POA: Diagnosis not present

## 2019-02-10 DIAGNOSIS — I739 Peripheral vascular disease, unspecified: Secondary | ICD-10-CM | POA: Diagnosis present

## 2019-02-10 DIAGNOSIS — Z888 Allergy status to other drugs, medicaments and biological substances status: Secondary | ICD-10-CM

## 2019-02-10 DIAGNOSIS — I9789 Other postprocedural complications and disorders of the circulatory system, not elsewhere classified: Secondary | ICD-10-CM | POA: Diagnosis not present

## 2019-02-10 DIAGNOSIS — Z79899 Other long term (current) drug therapy: Secondary | ICD-10-CM

## 2019-02-10 DIAGNOSIS — F17210 Nicotine dependence, cigarettes, uncomplicated: Secondary | ICD-10-CM | POA: Diagnosis not present

## 2019-02-10 DIAGNOSIS — Y835 Amputation of limb(s) as the cause of abnormal reaction of the patient, or of later complication, without mention of misadventure at the time of the procedure: Secondary | ICD-10-CM | POA: Diagnosis present

## 2019-02-10 DIAGNOSIS — E1151 Type 2 diabetes mellitus with diabetic peripheral angiopathy without gangrene: Secondary | ICD-10-CM | POA: Diagnosis not present

## 2019-02-10 HISTORY — PX: AMPUTATION: SHX166

## 2019-02-10 HISTORY — DX: Unspecified abdominal hernia without obstruction or gangrene: K46.9

## 2019-02-10 HISTORY — PX: AMPUTATION TOE: SHX6595

## 2019-02-10 HISTORY — DX: Other complications of procedures, not elsewhere classified, initial encounter: T81.89XA

## 2019-02-10 LAB — BASIC METABOLIC PANEL
Anion gap: 15 (ref 5–15)
BUN: 12 mg/dL (ref 6–20)
CALCIUM: 8.9 mg/dL (ref 8.9–10.3)
CO2: 21 mmol/L — ABNORMAL LOW (ref 22–32)
CREATININE: 0.73 mg/dL (ref 0.61–1.24)
Chloride: 101 mmol/L (ref 98–111)
GFR calc Af Amer: >60 mL/min (ref >60)
GFR calc non Af Amer: >60 mL/min (ref >60)
Glucose, Bld: 183 mg/dL — ABNORMAL HIGH (ref 70–99)
Potassium: 4 mmol/L (ref 3.5–5.1)
Sodium: 137 mmol/L (ref 135–145)

## 2019-02-10 LAB — CBC
HCT: 42.8 % (ref 39.0–52.0)
Hemoglobin: 14.2 g/dL (ref 13.0–17.0)
MCH: 30.2 pg (ref 26.0–34.0)
MCHC: 33.2 g/dL (ref 30.0–36.0)
MCV: 91.1 fL (ref 80.0–100.0)
PLATELETS: 234 10*3/uL (ref 150–400)
RBC: 4.7 MIL/uL (ref 4.22–5.81)
RDW: 11.8 % (ref 11.5–15.5)
WBC: 10.5 10*3/uL (ref 4.0–10.5)
nRBC: 0 % (ref 0.0–0.2)

## 2019-02-10 LAB — GLUCOSE, CAPILLARY
Glucose-Capillary: 169 mg/dL — ABNORMAL HIGH (ref 70–99)
Glucose-Capillary: 163 mg/dL — ABNORMAL HIGH (ref 70–99)
Glucose-Capillary: 230 mg/dL — ABNORMAL HIGH (ref 70–99)

## 2019-02-10 SURGERY — AMPUTATION DIGIT
Anesthesia: General | Site: Foot | Laterality: Right

## 2019-02-10 MED ORDER — ONDANSETRON HCL 4 MG/2ML IJ SOLN
INTRAMUSCULAR | Status: DC | PRN
  Administered 2019-02-10: 4 mg via INTRAVENOUS

## 2019-02-10 MED ORDER — OXYCODONE-ACETAMINOPHEN 5-325 MG PO TABS
ORAL_TABLET | ORAL | Status: AC
  Filled 2019-02-10: qty 2

## 2019-02-10 MED ORDER — GABAPENTIN 600 MG PO TABS
600.0000 mg | ORAL_TABLET | Freq: Three times a day (TID) | ORAL | Status: DC | PRN
  Administered 2019-02-10 – 2019-02-12 (×3): 600 mg via ORAL
  Filled 2019-02-10 (×4): qty 1

## 2019-02-10 MED ORDER — KETOROLAC TROMETHAMINE 30 MG/ML IJ SOLN
INTRAMUSCULAR | Status: AC
  Filled 2019-02-10: qty 1

## 2019-02-10 MED ORDER — LIDOCAINE 2% (20 MG/ML) 5 ML SYRINGE
INTRAMUSCULAR | Status: DC | PRN
  Administered 2019-02-10: 100 mg via INTRAVENOUS

## 2019-02-10 MED ORDER — PANTOPRAZOLE SODIUM 40 MG PO TBEC
40.0000 mg | DELAYED_RELEASE_TABLET | Freq: Every day | ORAL | Status: DC
  Administered 2019-02-11 – 2019-02-12 (×2): 40 mg via ORAL
  Filled 2019-02-10 (×2): qty 1

## 2019-02-10 MED ORDER — SODIUM CHLORIDE 0.9 % IV SOLN: 250.0000 mL | INTRAVENOUS | Status: DC | PRN

## 2019-02-10 MED ORDER — FENTANYL CITRATE (PF) 250 MCG/5ML IJ SOLN
INTRAMUSCULAR | Status: AC
  Filled 2019-02-10: qty 5

## 2019-02-10 MED ORDER — LACTATED RINGERS IV SOLN
INTRAVENOUS | Status: DC | PRN
  Administered 2019-02-10 (×2): via INTRAVENOUS

## 2019-02-10 MED ORDER — SODIUM CHLORIDE 0.9% FLUSH: 3.0000 mL | INTRAVENOUS | Status: DC | PRN

## 2019-02-10 MED ORDER — SIMVASTATIN 20 MG PO TABS
40.0000 mg | ORAL_TABLET | Freq: Every evening | ORAL | Status: DC
  Administered 2019-02-10 – 2019-02-11 (×2): 40 mg via ORAL
  Filled 2019-02-10 (×2): qty 2

## 2019-02-10 MED ORDER — METOPROLOL TARTRATE 5 MG/5ML IV SOLN: 2.0000 mg | INTRAVENOUS | Status: DC | PRN

## 2019-02-10 MED ORDER — SODIUM CHLORIDE 0.9% FLUSH
3.0000 mL | Freq: Two times a day (BID) | INTRAVENOUS | Status: DC
  Administered 2019-02-11 (×2): 3 mL via INTRAVENOUS

## 2019-02-10 MED ORDER — DULAGLUTIDE 0.75 MG/0.5ML ~~LOC~~ SOAJ: 0.7500 mg | SUBCUTANEOUS | Status: DC

## 2019-02-10 MED ORDER — SUCCINYLCHOLINE CHLORIDE 200 MG/10ML IV SOSY
PREFILLED_SYRINGE | INTRAVENOUS | Status: DC | PRN
  Administered 2019-02-10: 140 mg via INTRAVENOUS

## 2019-02-10 MED ORDER — METFORMIN HCL ER 500 MG PO TB24
1000.0000 mg | ORAL_TABLET | Freq: Two times a day (BID) | ORAL | Status: DC
  Administered 2019-02-10 – 2019-02-12 (×4): 1000 mg via ORAL
  Filled 2019-02-10 (×4): qty 2

## 2019-02-10 MED ORDER — PHENYLEPHRINE 40 MCG/ML (10ML) SYRINGE FOR IV PUSH (FOR BLOOD PRESSURE SUPPORT)
PREFILLED_SYRINGE | INTRAVENOUS | Status: AC
  Filled 2019-02-10: qty 10

## 2019-02-10 MED ORDER — LIDOCAINE HCL 4 % MT SOLN
OROMUCOSAL | Status: DC | PRN
  Administered 2019-02-10: 2 mL via TOPICAL

## 2019-02-10 MED ORDER — DEXAMETHASONE SODIUM PHOSPHATE 10 MG/ML IJ SOLN
INTRAMUSCULAR | Status: AC
  Filled 2019-02-10: qty 1

## 2019-02-10 MED ORDER — SENNOSIDES-DOCUSATE SODIUM 8.6-50 MG PO TABS: 1.0000 | ORAL_TABLET | Freq: Every evening | ORAL | Status: DC | PRN

## 2019-02-10 MED ORDER — GUAIFENESIN-DM 100-10 MG/5ML PO SYRP: 15.0000 mL | ORAL_SOLUTION | ORAL | Status: DC | PRN

## 2019-02-10 MED ORDER — LIDOCAINE 2% (20 MG/ML) 5 ML SYRINGE
INTRAMUSCULAR | Status: AC
  Filled 2019-02-10: qty 5

## 2019-02-10 MED ORDER — SUCCINYLCHOLINE CHLORIDE 200 MG/10ML IV SOSY
PREFILLED_SYRINGE | INTRAVENOUS | Status: AC
  Filled 2019-02-10: qty 10

## 2019-02-10 MED ORDER — ONDANSETRON HCL 4 MG/2ML IJ SOLN: 4.0000 mg | Freq: Four times a day (QID) | INTRAMUSCULAR | Status: DC | PRN

## 2019-02-10 MED ORDER — KETOROLAC TROMETHAMINE 30 MG/ML IJ SOLN
30.0000 mg | Freq: Once | INTRAMUSCULAR | Status: AC | PRN
  Administered 2019-02-10: 30 mg via INTRAVENOUS

## 2019-02-10 MED ORDER — OXYCODONE-ACETAMINOPHEN 5-325 MG PO TABS
1.0000 | ORAL_TABLET | ORAL | Status: DC | PRN
  Administered 2019-02-10 – 2019-02-12 (×11): 2 via ORAL
  Filled 2019-02-10 (×11): qty 2

## 2019-02-10 MED ORDER — PROPOFOL 10 MG/ML IV BOLUS
INTRAVENOUS | Status: DC | PRN
  Administered 2019-02-10: 50 mg via INTRAVENOUS
  Administered 2019-02-10: 200 mg via INTRAVENOUS

## 2019-02-10 MED ORDER — LABETALOL HCL 5 MG/ML IV SOLN
10.0000 mg | INTRAVENOUS | Status: DC | PRN
  Filled 2019-02-10: qty 4

## 2019-02-10 MED ORDER — ASPIRIN 81 MG PO CHEW
81.0000 mg | CHEWABLE_TABLET | Freq: Every day | ORAL | Status: DC
  Administered 2019-02-11 – 2019-02-12 (×2): 81 mg via ORAL
  Filled 2019-02-10 (×2): qty 1

## 2019-02-10 MED ORDER — HYDROMORPHONE HCL 1 MG/ML IJ SOLN
INTRAMUSCULAR | Status: AC
  Filled 2019-02-10: qty 1

## 2019-02-10 MED ORDER — GLIPIZIDE ER 10 MG PO TB24
10.0000 mg | ORAL_TABLET | Freq: Every day | ORAL | Status: DC
  Administered 2019-02-11 – 2019-02-12 (×2): 10 mg via ORAL
  Filled 2019-02-10 (×2): qty 1

## 2019-02-10 MED ORDER — HEPARIN SODIUM (PORCINE) 5000 UNIT/ML IJ SOLN
5000.0000 [IU] | Freq: Three times a day (TID) | INTRAMUSCULAR | Status: DC
  Administered 2019-02-10 – 2019-02-12 (×5): 5000 [IU] via SUBCUTANEOUS
  Filled 2019-02-10 (×4): qty 1

## 2019-02-10 MED ORDER — 0.9 % SODIUM CHLORIDE (POUR BTL) OPTIME
TOPICAL | Status: DC | PRN
  Administered 2019-02-10: 1000 mL

## 2019-02-10 MED ORDER — MIDAZOLAM HCL 5 MG/5ML IJ SOLN
INTRAMUSCULAR | Status: DC | PRN
  Administered 2019-02-10: 2 mg via INTRAVENOUS

## 2019-02-10 MED ORDER — HYDROMORPHONE HCL 1 MG/ML IJ SOLN
0.2500 mg | INTRAMUSCULAR | Status: DC | PRN
  Administered 2019-02-10: 0.5 mg via INTRAVENOUS
  Administered 2019-02-10: 1 mg via INTRAVENOUS

## 2019-02-10 MED ORDER — ONDANSETRON HCL 4 MG/2ML IJ SOLN: 4.0000 mg | Freq: Once | INTRAMUSCULAR | Status: DC | PRN

## 2019-02-10 MED ORDER — FENTANYL CITRATE (PF) 100 MCG/2ML IJ SOLN
INTRAMUSCULAR | Status: DC | PRN
  Administered 2019-02-10: 100 ug via INTRAVENOUS
  Administered 2019-02-10: 50 ug via INTRAVENOUS

## 2019-02-10 MED ORDER — MEPERIDINE HCL 50 MG/ML IJ SOLN: 6.2500 mg | INTRAMUSCULAR | Status: DC | PRN

## 2019-02-10 MED ORDER — LACTATED RINGERS IV SOLN
INTRAVENOUS | Status: DC
  Administered 2019-02-10: 08:00:00 via INTRAVENOUS

## 2019-02-10 MED ORDER — CLOPIDOGREL BISULFATE 75 MG PO TABS
75.0000 mg | ORAL_TABLET | Freq: Every day | ORAL | Status: DC
  Administered 2019-02-11 – 2019-02-12 (×2): 75 mg via ORAL
  Filled 2019-02-10 (×2): qty 1

## 2019-02-10 MED ORDER — POTASSIUM CHLORIDE CRYS ER 20 MEQ PO TBCR
20.0000 meq | EXTENDED_RELEASE_TABLET | Freq: Once | ORAL | Status: DC
  Filled 2019-02-10: qty 2

## 2019-02-10 MED ORDER — CYCLOBENZAPRINE HCL 10 MG PO TABS
10.0000 mg | ORAL_TABLET | Freq: Three times a day (TID) | ORAL | Status: DC | PRN
  Administered 2019-02-10 – 2019-02-11 (×3): 10 mg via ORAL
  Filled 2019-02-10 (×4): qty 1

## 2019-02-10 MED ORDER — DOCUSATE SODIUM 100 MG PO CAPS
100.0000 mg | ORAL_CAPSULE | Freq: Two times a day (BID) | ORAL | Status: DC
  Administered 2019-02-10 – 2019-02-12 (×4): 100 mg via ORAL
  Filled 2019-02-10 (×4): qty 1

## 2019-02-10 MED ORDER — MIDAZOLAM HCL 2 MG/2ML IJ SOLN
INTRAMUSCULAR | Status: AC
  Filled 2019-02-10: qty 2

## 2019-02-10 MED ORDER — ONDANSETRON HCL 4 MG/2ML IJ SOLN
INTRAMUSCULAR | Status: AC
  Filled 2019-02-10: qty 2

## 2019-02-10 MED ORDER — DEXAMETHASONE SODIUM PHOSPHATE 10 MG/ML IJ SOLN
INTRAMUSCULAR | Status: DC | PRN
  Administered 2019-02-10: 6 mg via INTRAVENOUS

## 2019-02-10 MED ORDER — PROPOFOL 10 MG/ML IV BOLUS
INTRAVENOUS | Status: AC
  Filled 2019-02-10: qty 20

## 2019-02-10 MED ORDER — HYDROMORPHONE HCL 1 MG/ML IJ SOLN
0.5000 mg | INTRAMUSCULAR | Status: DC | PRN
  Administered 2019-02-10 – 2019-02-12 (×6): 1 mg via INTRAVENOUS
  Filled 2019-02-10 (×5): qty 1

## 2019-02-10 MED ORDER — PHENOL 1.4 % MT LIQD: 1.0000 | OROMUCOSAL | Status: DC | PRN

## 2019-02-10 MED ORDER — HYDRALAZINE HCL 20 MG/ML IJ SOLN: 5.0000 mg | INTRAMUSCULAR | Status: DC | PRN

## 2019-02-10 MED ORDER — ALUM & MAG HYDROXIDE-SIMETH 200-200-20 MG/5ML PO SUSP: 15.0000 mL | ORAL | Status: DC | PRN

## 2019-02-10 SURGICAL SUPPLY — 29 items
BANDAGE ACE 4X5 VEL STRL LF (GAUZE/BANDAGES/DRESSINGS) ×2 IMPLANT
BLADE AVERAGE 25X9 (BLADE) IMPLANT
BLADE SAW SGTL 81X20 HD (BLADE) IMPLANT
BNDG GAUZE ELAST 4 BULKY (GAUZE/BANDAGES/DRESSINGS) IMPLANT
CANISTER SUCT 3000ML PPV (MISCELLANEOUS) ×2 IMPLANT
COVER SURGICAL LIGHT HANDLE (MISCELLANEOUS) IMPLANT
COVER WAND RF STERILE (DRAPES) IMPLANT
DRAPE EXTREMITY T 121X128X90 (DISPOSABLE) ×2 IMPLANT
DRAPE HALF SHEET 40X57 (DRAPES) ×2 IMPLANT
ELECT REM PT RETURN 9FT ADLT (ELECTROSURGICAL) ×2
ELECTRODE REM PT RTRN 9FT ADLT (ELECTROSURGICAL) ×1 IMPLANT
GAUZE SPONGE 4X4 12PLY STRL (GAUZE/BANDAGES/DRESSINGS) ×2 IMPLANT
GLOVE BIO SURGEON STRL SZ7.5 (GLOVE) ×2 IMPLANT
GOWN STRL REUS W/ TWL LRG LVL3 (GOWN DISPOSABLE) ×2 IMPLANT
GOWN STRL REUS W/ TWL XL LVL3 (GOWN DISPOSABLE) ×1 IMPLANT
GOWN STRL REUS W/TWL LRG LVL3 (GOWN DISPOSABLE) ×2
GOWN STRL REUS W/TWL XL LVL3 (GOWN DISPOSABLE) ×1
KIT BASIN OR (CUSTOM PROCEDURE TRAY) ×2 IMPLANT
KIT TURNOVER KIT B (KITS) ×2 IMPLANT
NEEDLE HYPO 25GX1X1/2 BEV (NEEDLE) IMPLANT
NS IRRIG 1000ML POUR BTL (IV SOLUTION) ×2 IMPLANT
PACK CV ACCESS (CUSTOM PROCEDURE TRAY) ×2 IMPLANT
PACK GENERAL/GYN (CUSTOM PROCEDURE TRAY) IMPLANT
PAD ARMBOARD 7.5X6 YLW CONV (MISCELLANEOUS) ×4 IMPLANT
SUT ETHILON 3 0 PS 1 (SUTURE) ×2 IMPLANT
SYR CONTROL 10ML LL (SYRINGE) IMPLANT
TOWEL GREEN STERILE (TOWEL DISPOSABLE) ×4 IMPLANT
UNDERPAD 30X30 (UNDERPADS AND DIAPERS) ×2 IMPLANT
WATER STERILE IRR 1000ML POUR (IV SOLUTION) ×2 IMPLANT

## 2019-02-10 NOTE — H&P (Signed)
   History and Physical Update  The patient was interviewed and re-examined.  The patient's previous History and Physical has been reviewed and is unchanged from recent office visit. Plan for debridement with possible further amputation and possible packing.   Anquinette Pierro C. Donzetta Matters, MD [facility] Office: [phone] Pager: [phone]   02/10/2019, 8:09 AM

## 2019-02-10 NOTE — Progress Notes (Addendum)
Received pt from PACU. Pt alert and oriented x4. Dressing on right foot clean, dry and intact. Full sensation, warm, pulse 2+.  Patient oriented to room/call bell. Son at bedside. Will continue to monitor.

## 2019-02-10 NOTE — Anesthesia Postprocedure Evaluation (Signed)
Anesthesia Post Note  Patient: Derrick Clark  Procedure(s) Performed: AMPUTATION RAY RIGHT 5TH TOE WITH DEBRIDEMENT WOUND BED (Right Foot)     Patient location during evaluation: PACU Anesthesia Type: General Level of consciousness: awake Pain management: pain level controlled Vital Signs Assessment: post-procedure vital signs reviewed and stable Respiratory status: spontaneous breathing Cardiovascular status: stable Postop Assessment: no apparent nausea or vomiting Anesthetic complications: no    Last Vitals:  Vitals:   02/10/19 1000 02/10/19 1006  BP: 116/66   Pulse: 78 83  Resp: 11 14  Temp:  (!) 36.4 C  SpO2: 98% 97%    Last Pain:  Vitals:   02/10/19 0916  TempSrc:   PainSc: 7    Pain Goal: Patients Stated Pain Goal: 2 (02/10/19 0747)                 Huston Foley

## 2019-02-10 NOTE — Transfer of Care (Signed)
Immediate Anesthesia Transfer of Care Note  Patient: Derrick Clark  Procedure(s) Performed: AMPUTATION RAY RIGHT 5TH TOE WITH DEBRIDEMENT WOUND BED (Right Foot)  Patient Location: PACU  Anesthesia Type:General  Level of Consciousness: awake, alert  and patient cooperative  Airway & Oxygen Therapy: Patient Spontanous Breathing and Patient connected to face mask oxygen  Post-op Assessment: Report given to RN, Post -op Vital signs reviewed and stable and Patient moving all extremities X 4  Post vital signs: Reviewed and stable  Last Vitals:  Vitals Value Taken Time  BP    Temp    Pulse    Resp    SpO2      Last Pain:  Vitals:   02/10/19 0747  TempSrc:   PainSc: 4       Patients Stated Pain Goal: 2 (81/15/72 6203)  Complications: No apparent anesthesia complications

## 2019-02-10 NOTE — Op Note (Signed)
    Patient name: OMAREE FUQUA MRN: [id number] DOB: 08/02/1961 Sex: male  02/10/2019 Pre-operative Diagnosis: Necrosis right fifth toe amputation Post-operative diagnosis:  Same Surgeon:  Erlene Quan C. Donzetta Matters, MD Procedure Performed: Debridement right fifth toe amputation site  Indications: 58 year old male recently underwent revascularization right lower extremity now has palpable dorsalis pedis pulse and has had right fifth amputation that has had issues healing.  Is nonacute for debridement.  Findings: Skin, soft tissue and bone was debrided back to healthy bleeding tissue.  The wound was packed open   Procedure:  The patient was identified in the holding area and taken to the operating room where is placed by operative when general anesthesia was induced.  He was sterilely prepped draped the right lower extremity usual fashion antibiotics were administered timeout called.  I began using 10 blade to debride skin and we soft tissue back to healthy bleeding tissue and cautery was used to obtain hemostasis.  I used a rondure to take the bone back taken off the metatarsal head.  I irrigated the wound thoroughly obtain hemostasis and packed it open.  He tolerated procedure without immediate complication.  All counts were correct at completion.  EBL: 10 cc   Vertie Dibbern C. Donzetta Matters, MD [facility] Office: [phone] Pager: [phone]

## 2019-02-10 NOTE — Anesthesia Procedure Notes (Signed)
Procedure Name: Intubation Date/Time: 02/10/2019 8:37 AM Performed by: Orlie Dakin, CRNA Pre-anesthesia Checklist: Patient identified, Emergency Drugs available, Suction available and Patient being monitored Patient Re-evaluated:Patient Re-evaluated prior to induction Oxygen Delivery Method: Circle system utilized Preoxygenation: Pre-oxygenation with 100% oxygen Induction Type: IV induction Ventilation: Mask ventilation without difficulty and Oral airway inserted - appropriate to patient size Laryngoscope Size: Sabra Heck and 3 Grade View: Grade II Tube type: Oral Tube size: 7.0 mm Number of attempts: 1 Airway Equipment and Method: Stylet and LTA kit utilized Placement Confirmation: ETT inserted through vocal cords under direct vision,  positive ETCO2 and breath sounds checked- equal and bilateral Secured at: 22 cm Tube secured with: Tape Dental Injury: Teeth and Oropharynx as per pre-operative assessment  Comments: Very poor dentition noted on bottom front, none loose per patient report.  Oral suctioned gently at end of case.

## 2019-02-10 NOTE — Progress Notes (Addendum)
Dr. Donzetta Matters ordered Istat8.  Per Janett Billow, we do not run Istat8's, need to pull a BMP & CBC.  Pt recently had BMP & CBC on 02-01-19.    Per verbal order from Dr. Donzetta Matters, draw CBC & BMP for surgery today.

## 2019-02-10 NOTE — Anesthesia Preprocedure Evaluation (Signed)
Anesthesia Evaluation  Patient identified by MRN, date of birth, ID band Patient awake    Reviewed: Allergy & Precautions, NPO status , Patient's Chart, lab work & pertinent test results  History of Anesthesia Complications Negative for: history of anesthetic complications  Airway Mallampati: II  TM Distance: >3 FB Neck ROM: Full    Dental  (+) Dental Advisory Given, Poor Dentition   Pulmonary Current Smoker,    breath sounds clear to auscultation       Cardiovascular + Peripheral Vascular Disease   Rhythm:Regular Rate:Normal     Neuro/Psych negative neurological ROS  negative psych ROS   GI/Hepatic negative GI ROS, Neg liver ROS,   Endo/Other  diabetes, Type 2, Oral Hypoglycemic AgentsMorbid obesity  Renal/GU negative Renal ROS     Musculoskeletal  (+) Arthritis ,   Abdominal (+) + obese,   Peds  Hematology negative hematology ROS (+)   Anesthesia Other Findings   Reproductive/Obstetrics                             Anesthesia Physical  Anesthesia Plan  ASA: III  Anesthesia Plan: General   Post-op Pain Management:    Induction: Intravenous  PONV Risk Score and Plan: 3 and Treatment may vary due to age or medical condition, Ondansetron and Dexamethasone  Airway Management Planned: Oral ETT  Additional Equipment: None  Intra-op Plan:   Post-operative Plan: Extubation in OR  Informed Consent: I have reviewed the patients History and Physical, chart, labs and discussed the procedure including the risks, benefits and alternatives for the proposed anesthesia with the patient or authorized representative who has indicated his/her understanding and acceptance.     Dental advisory given  Plan Discussed with: CRNA  Anesthesia Plan Comments:         Anesthesia Quick Evaluation

## 2019-02-11 ENCOUNTER — Encounter (HOSPITAL_COMMUNITY): Payer: Self-pay | Admitting: Vascular Surgery

## 2019-02-11 DIAGNOSIS — Z7984 Long term (current) use of oral hypoglycemic drugs: Secondary | ICD-10-CM | POA: Diagnosis not present

## 2019-02-11 DIAGNOSIS — F17210 Nicotine dependence, cigarettes, uncomplicated: Secondary | ICD-10-CM | POA: Diagnosis present

## 2019-02-11 DIAGNOSIS — Z882 Allergy status to sulfonamides status: Secondary | ICD-10-CM | POA: Diagnosis not present

## 2019-02-11 DIAGNOSIS — E1152 Type 2 diabetes mellitus with diabetic peripheral angiopathy with gangrene: Secondary | ICD-10-CM | POA: Diagnosis present

## 2019-02-11 DIAGNOSIS — Z888 Allergy status to other drugs, medicaments and biological substances status: Secondary | ICD-10-CM | POA: Diagnosis not present

## 2019-02-11 DIAGNOSIS — Y835 Amputation of limb(s) as the cause of abnormal reaction of the patient, or of later complication, without mention of misadventure at the time of the procedure: Secondary | ICD-10-CM | POA: Diagnosis present

## 2019-02-11 DIAGNOSIS — Z79899 Other long term (current) drug therapy: Secondary | ICD-10-CM | POA: Diagnosis not present

## 2019-02-11 DIAGNOSIS — T8753 Necrosis of amputation stump, right lower extremity: Secondary | ICD-10-CM | POA: Diagnosis present

## 2019-02-11 DIAGNOSIS — T8189XA Other complications of procedures, not elsewhere classified, initial encounter: Secondary | ICD-10-CM | POA: Diagnosis not present

## 2019-02-11 LAB — HIV ANTIBODY (ROUTINE TESTING W REFLEX): HIV SCREEN 4TH GENERATION: NONREACTIVE

## 2019-02-11 MED ORDER — OXYCODONE-ACETAMINOPHEN 5-325 MG PO TABS: 1.0000 | ORAL_TABLET | ORAL | 0 refills | Status: DC | PRN

## 2019-02-11 NOTE — Consult Note (Addendum)
[facility] Nurse wound consult note [facility] team requested to apply Vac to foot wound.  Vascular team following for assessment and plan of care to wound and was in earlier to assess site.  Pt states location began bleeding and pressure dressing was applied at that time.  Removed current dressing and wound is still bleeding. Pt had large amt pain when dressing was removed.   Wound type: Full thickness post-op wound to right outer foot Wound bed: dark red with mod amt blood draining from wound Periwound: intact skin surrounding Dressing procedure/placement/frequency: Called Dr Donzetta Matters of the vascular team to discuss that Vac therapy is contraindicated with bleeding, according to the company's recommendations, since adding suction can increase the amount of bleeding and blood can cause the track pad to clog and stop the machine from functioning. He states a PA will assess the site later today and determine further plan of care. Re-applied pressure dressing with gauze and ace wrap. [facility] team is not available during the weekend. Please re-consult if further assistance is needed.  Thank-you,  Julien Girt MSN, [place], [facility], [place], [place]

## 2019-02-11 NOTE — Care Management Note (Addendum)
Case Management Note  Patient Details  Name: Derrick Clark MRN: [id number] Date of Birth: 1961-05-17  Subjective/Objective:                    Action/Plan:Wound measurements given to [facility] with [facility] Paged PA for wound measurements  See WOC note. Await wound care instructions. Will need signature on Negative pressure form if needed. Text paged PA  Spoke to patient at bedside, confirmed face sheet information.  Provided medicare.gov list. Patient has no preference. Called Dan with [facility] ,awiting call back. Dan with [facility] has accepted referral.   Spoke to [facility] PA patient will need negative pressure system. Order form placed in shadow chart for signature.   Ordered same through [facility] Expected Discharge Date:                  Expected Discharge Plan:  Man  In-House Referral:  NA  Discharge planning Services  CM Consult  Post Acute Care Choice:  Home Health, Durable Medical Equipment Choice offered to:  Patient  DME Arranged:  Vac DME Agency:     HH Arranged:  RN [facility] Agency:     Status of Service:  In process, will continue to follow  If discussed at Long Length of Stay Meetings, dates discussed:    Additional Comments:  Marilu Favre, RN 02/11/2019, 10:25 AM

## 2019-02-11 NOTE — Care Management (Signed)
Patient on heart healthy diet. Nutrition adequate for promoting wound healing.   Magdalen Spatz RN BSN [phone]

## 2019-02-11 NOTE — Progress Notes (Addendum)
  Progress Note    02/11/2019 8:28 AM 1 Day Post-Op  Subjective: Having pain at surgical site right foot  Vitals:   02/10/19 2048 02/11/19 0514  BP: 106/71 126/70  Pulse: 72 69  Resp: 16 16  Temp: 98.2 F (36.8 C) 97.9 F (36.6 C)  SpO2: 94% 98%    Physical Exam: Awake alert oriented Palpable right dorsalis pedis pulse Healthy tissue and right fifth toe amputation site  CBC    Component Value Date/Time   WBC 10.5 02/10/2019 0807   RBC 4.70 02/10/2019 0807   HGB 14.2 02/10/2019 0807   HCT 42.8 02/10/2019 0807   PLT 234 02/10/2019 0807   MCV 91.1 02/10/2019 0807   MCH 30.2 02/10/2019 0807   MCHC 33.2 02/10/2019 0807   RDW 11.8 02/10/2019 0807    BMET    Component Value Date/Time   NA 137 02/10/2019 0807   K 4.0 02/10/2019 0807   CL 101 02/10/2019 0807   CO2 21 (L) 02/10/2019 0807   GLUCOSE 183 (H) 02/10/2019 0807   BUN 12 02/10/2019 0807   CREATININE 0.73 02/10/2019 0807   CALCIUM 8.9 02/10/2019 0807   GFRNONAA >60 02/10/2019 0807   GFRAA >60 02/10/2019 0807    INR No results found for: INR   Intake/Output Summary (Last 24 hours) at 02/11/2019 0828 Last data filed at 02/11/2019 0600 Gross per 24 hour  Intake 2979.5 ml  Output 1025 ml  Net 1954.5 ml     Assessment:  58 y.o. male is status post revision right fifth toe amputation with adequate bleeding.  Plan: Wound VAC to be placed today Will need set up for home Plan to keep today with physical therapy and needs better pain control. DVT prophylaxis: Subcutaneous heparin  Brandon C. Donzetta Matters, MD [facility] [place] Office: [phone] Pager: [phone]  02/11/2019 8:28 AM  Addendum:   Wound is 2-1/2 cm transverse by 2 cm vertical diameter and 1-1/2 cm deep.  Servando Snare

## 2019-02-12 NOTE — Progress Notes (Signed)
Orthopedic Tech Progress Note Patient Details:  Derrick Clark 1961/05/16 [id number] RN called up requesting for a pair of crutches for this patient. Getting ready for discharge.  Ortho Devices Type of Ortho Device: Crutches Ortho Device/Splint Interventions: Adjustment, Application, Ordered   Post Interventions Patient Tolerated: Well Instructions Provided: Poper ambulation with device, Care of device, Adjustment of device   [name] 02/12/2019, 1:07 PM

## 2019-02-12 NOTE — Evaluation (Signed)
Physical Therapy Evaluation Patient Details Name: Derrick Clark MRN: [id number] DOB: [birth year] Today's Date: 02/12/2019   History of Present Illness  Pt s/p revision of rt 5th toe amputation. PMH - rt 5th toe amputation, DM, lt rotator cuff repair  Clinical Impression  Pt doing well with mobility and no further PT needed.  Ready for dc from PT standpoint. Reinforced to pt to wear Darco shoe.  MD: Please write order for crutches for home.     Follow Up Recommendations No PT follow up    Equipment Recommendations  Crutches    Recommendations for Other Services       Precautions / Restrictions Precautions Precautions: Other (comment) Precaution Comments: Pt weight bearing through rt heal. Pt has Darco shoe at home. Instructed pt to use Darco shoe Restrictions Weight Bearing Restrictions: No      Mobility  Bed Mobility Overal bed mobility: Independent                Transfers Overall transfer level: Independent                  Ambulation/Gait Ambulation/Gait assistance: Modified independent (Device/Increase time) Gait Distance (Feet): 300 Feet Assistive device: Rolling walker (2 wheeled);Crutches Gait Pattern/deviations: Step-through pattern;Decreased stride length Gait velocity: decr Gait velocity interpretation: 1.31 - 2.62 ft/sec, indicative of limited community ambulator General Gait Details: Steady gait with crutches or rollling walker  Stairs            Wheelchair Mobility    Modified Rankin (Stroke Patients Only)       Balance Overall balance assessment: No apparent balance deficits (not formally assessed)                                           Pertinent Vitals/Pain Pain Assessment: 0-10 Pain Score: 7  Pain Location: rt foot Pain Descriptors / Indicators: Throbbing Pain Intervention(s): Monitored during session;Limited activity within patient's tolerance    Home Living Family/patient expects to be  discharged to:: Private residence Living Arrangements: Spouse/significant other;Children Available Help at Discharge: Family;Available PRN/intermittently Type of Home: House Home Access: Stairs to enter Entrance Stairs-Rails: None Entrance Stairs-Number of Steps: 2 Home Layout: One level Home Equipment: Walker - 2 wheels;Bedside commode;Shower seat      Prior Function Level of Independence: Independent               Hand Dominance        Extremity/Trunk Assessment   Upper Extremity Assessment Upper Extremity Assessment: Overall WFL for tasks assessed    Lower Extremity Assessment Lower Extremity Assessment: RLE deficits/detail;Overall WFL for tasks assessed RLE Deficits / Details: 5th toe amputation       Communication   Communication: No difficulties  Cognition Arousal/Alertness: Awake/alert Behavior During Therapy: WFL for tasks assessed/performed Overall Cognitive Status: Within Functional Limits for tasks assessed                                        General Comments      Exercises     Assessment/Plan    PT Assessment Patent does not need any further PT services  PT Problem List         PT Treatment Interventions      PT Goals (Current goals can be found in the  Care Plan section)  Acute Rehab PT Goals PT Goal Formulation: All assessment and education complete, DC therapy    Frequency     Barriers to discharge        Co-evaluation               AM-PAC PT "6 Clicks" Mobility  Outcome Measure Help needed turning from your back to your side while in a flat bed without using bedrails?: None Help needed moving from lying on your back to sitting on the side of a flat bed without using bedrails?: None Help needed moving to and from a bed to a chair (including a wheelchair)?: None Help needed standing up from a chair using your arms (e.g., wheelchair or bedside chair)?: None Help needed to walk in hospital room?:  None Help needed climbing 3-5 steps with a railing? : None 6 Click Score: 24    End of Session   Activity Tolerance: Patient tolerated treatment well Patient left: in bed;with call bell/phone within reach Nurse Communication: Mobility status PT Visit Diagnosis: Other abnormalities of gait and mobility (R26.89)    Time: 6168-3729 PT Time Calculation (min) (ACUTE ONLY): 18 min   Charges:   PT Evaluation $PT Eval Low Complexity: 1 Low          [place] Pager [phone] Office [place] 02/12/2019, 9:37 AM

## 2019-02-12 NOTE — Care Management (Signed)
Per Dr. Oneida Alar, the patient will go home with wet/dry dressings and [facility] will come to home Sunday or Monday to apply wound vac.  Spoke with Brenton Grills with [facility]. All documentation is complete and visits will be arranged.

## 2019-02-12 NOTE — Progress Notes (Addendum)
  Progress Note    02/12/2019 11:40 AM 2 Days Post-Op  Subjective:  Pain better controlled today if he takes his pain med regularly  afebrile  Vitals:   02/11/19 2051 02/12/19 0534  BP: 125/80 140/82  Pulse:  66  Resp: 16 18  Temp: 97.8 F (36.6 C) 98.1 F (36.7 C)  SpO2:  97%     CBC    Component Value Date/Time   WBC 10.5 02/10/2019 0807   RBC 4.70 02/10/2019 0807   HGB 14.2 02/10/2019 0807   HCT 42.8 02/10/2019 0807   PLT 234 02/10/2019 0807   MCV 91.1 02/10/2019 0807   MCH 30.2 02/10/2019 0807   MCHC 33.2 02/10/2019 0807   RDW 11.8 02/10/2019 0807    BMET    Component Value Date/Time   NA 137 02/10/2019 0807   K 4.0 02/10/2019 0807   CL 101 02/10/2019 0807   CO2 21 (L) 02/10/2019 0807   GLUCOSE 183 (H) 02/10/2019 0807   BUN 12 02/10/2019 0807   CREATININE 0.73 02/10/2019 0807   CALCIUM 8.9 02/10/2019 0807   GFRNONAA >60 02/10/2019 0807   GFRAA >60 02/10/2019 0807    INR No results found for: INR   Intake/Output Summary (Last 24 hours) at 02/12/2019 1140 Last data filed at 02/12/2019 1004 Gross per 24 hour  Intake 700 ml  Output -  Net 700 ml     Assessment:  58 y.o. male is s/p:  revision right fifth toe amputation with adequate bleeding.  2 Days Post-Op  Plan: -pt's pain is better controlled today -did well with PT- DME crutches ordered -will need regular wound vac at discharge.  There is a Pravena in the room, however, this is not the correct device.  Spoke with RN and she will talk with CM to verify regular wound vac.  -will come back in just a little while and place wound vac with Dr. Oneida Alar -probable dc home today unless pain not controlled   Leontine Locket, PA-C [facility] [phone] 02/12/2019 11:40 AM   Agree with above Wound clean Wound VAC Monday home health Dc home today  Ruta Hinds, MD [facility] [place] Office: [phone] Pager: [phone]

## 2019-02-12 NOTE — Progress Notes (Signed)
Festus Barren Salek to be D/C'd  per MD order. Discussed with the patient and all questions fully answered.  VSS, Skin clean, dry and intact without evidence of skin break down, no evidence of skin tears noted.  IV catheter discontinued intact. Site without signs and symptoms of complications. Dressing and pressure applied.  An After Visit Summary was printed and given to the patient. Patient received prescription.  D/c education completed with patient/family including follow up instructions, medication list, d/c activities limitations if indicated, with other d/c instructions as indicated by MD - patient able to verbalize understanding, all questions fully answered.   Patient instructed to return to ED, call 911, or call MD for any changes in condition.   Patient to be escorted via [place], and D/C home via private auto.

## 2019-02-12 NOTE — Discharge Instructions (Signed)
Wet to dry saline dressing changes twice a day until wound vac is placed on Monday.  Heel weight bearing only with Darco shoe.  May use crutches.

## 2019-02-13 NOTE — Discharge Summary (Signed)
Discharge Summary    Derrick Clark 1961/05/05 58 y.o. male  [id number]  Admission Date: 02/10/2019  Discharge Date: 02/12/2019  Physician: No att. providers found  Admission Diagnosis: PAD (peripheral artery disease) ([place]) [I73.9]   HPI:   This is a 58 y.o. male recently underwent revascularization right lower extremity now has palpable dorsalis pedis pulse and has had right fifth amputation that has had issues healing.  Is nonacute for debridement.  Hospital Course:  The patient was admitted to the hospital and taken to the operating room on 02/10/2019 and underwent: Debridement right fifth toe amputation site    Findings: Skin, soft tissue and bone was debrided back to healthy bleeding tissue.  The wound was packed open  The pt tolerated the procedure well and was transported to the PACU in good condition.   By POD 1, he was having pain at the surgical site.  There was a bloody ooze and the vac was not placed and pt was kept another day for pain control.    By POD 2, he had worked with PT and did well.  DME crutches were ordered.  His wound vac was set up for [facility] to place on Monday.  He was sent home with saline and dressing supplies to get him through to Monday when wound vac placed.    The remainder of the hospital course consisted of increasing mobilization and increasing intake of solids without difficulty.  CBC    Component Value Date/Time   WBC 10.5 02/10/2019 0807   RBC 4.70 02/10/2019 0807   HGB 14.2 02/10/2019 0807   HCT 42.8 02/10/2019 0807   PLT 234 02/10/2019 0807   MCV 91.1 02/10/2019 0807   MCH 30.2 02/10/2019 0807   MCHC 33.2 02/10/2019 0807   RDW 11.8 02/10/2019 0807    BMET    Component Value Date/Time   NA 137 02/10/2019 0807   K 4.0 02/10/2019 0807   CL 101 02/10/2019 0807   CO2 21 (L) 02/10/2019 0807   GLUCOSE 183 (H) 02/10/2019 0807   BUN 12 02/10/2019 0807   CREATININE 0.73 02/10/2019 0807   CALCIUM 8.9 02/10/2019 0807   GFRNONAA >60  02/10/2019 0807   GFRAA >60 02/10/2019 0807      Discharge Instructions    Call MD for:  redness, tenderness, or signs of infection (pain, swelling, bleeding, redness, odor or green/yellow discharge around incision site)   Complete by:  As directed    Call MD for:  severe or increased pain, loss or decreased feeling  in affected limb(s)   Complete by:  As directed    Call MD for:  temperature >100.5   Complete by:  As directed    Discharge patient   Complete by:  As directed    Discharge disposition:  01-Home or Self Care   Discharge patient date:  02/12/2019   Nursing communication   Complete by:  As directed    Please give pt the bottle of saline that was opened today and supplies to do wet to dry dressing changes bid until the vac is placed on Monday.  Thanks   Resume previous diet   Complete by:  As directed       Discharge Diagnosis:  PAD (peripheral artery disease) ([place]) [I73.9]  Secondary Diagnosis: Patient Active Problem List   Diagnosis Date Noted  . PAD (peripheral artery disease) ([place]) 02/10/2019  . Peripheral artery disease ([place]) 02/01/2019  . Peripheral arterial disease ([place]) 01/31/2019   Past Medical  History:  Diagnosis Date  . Abdominal hernia   . Arthritis   . Diabetes mellitus without complication ([place])   . Nonhealing surgical wound    nonviable tissue  . PAD (peripheral artery disease) ([facility])      Allergies as of 02/12/2019      Reactions   Farxiga [dapagliflozin] Rash   Jardiance [empagliflozin] Rash   Sulfa Antibiotics Rash      Medication List    TAKE these medications   aspirin 81 MG chewable tablet Chew 81 mg by mouth daily.   cephALEXin 500 MG capsule Commonly known as:  KEFLEX Take 1 capsule (500 mg total) by mouth 3 (three) times daily.   clopidogrel 75 MG tablet Commonly known as:  PLAVIX Take 1 tablet (75 mg total) by mouth daily with breakfast.   cyclobenzaprine 10 MG tablet Commonly known as:  FLEXERIL Take 10 mg by mouth  3 (three) times daily as needed for muscle spasms.   gabapentin 600 MG tablet Commonly known as:  NEURONTIN Take 600 mg by mouth 3 (three) times daily as needed (pain).   glipiZIDE 10 MG 24 hr tablet Commonly known as:  GLUCOTROL XL Take 10 mg by mouth daily.   GOODYS BODY PAIN PO Take 1 packet by mouth daily as needed (pain).   metFORMIN 500 MG 24 hr tablet Commonly known as:  GLUCOPHAGE-XR Take 1,000 mg by mouth 2 (two) times daily.   oxyCODONE-acetaminophen 5-325 MG tablet Commonly known as:  PERCOCET/ROXICET Take 1-2 tablets by mouth every 4 (four) hours as needed for severe pain. What changed:    how much to take  when to take this   simvastatin 40 MG tablet Commonly known as:  ZOCOR Take 40 mg by mouth every evening.   TRULICITY 5.92 TW/4.4QK Sopn Generic drug:  Dulaglutide Inject 0.75 mg into the skin every Sunday.       Prescriptions given: Roxicet #20 No Refill  Instructions: Wet to dry saline dressing changes twice a day until wound vac is placed on Monday.  Heel weight bearing only with Darco shoe.  May use crutches.  Disposition: home with [facility] and wound vac placement on Monday 02/14/2019  Patient's condition: is Good  Follow up: 1. Dr. Donzetta Matters in 2 weeks   Leontine Locket, PA-C [facility] [phone] 02/13/2019  11:41 AM

## 2019-02-14 ENCOUNTER — Other Ambulatory Visit: Payer: Self-pay

## 2019-02-14 DIAGNOSIS — Z7982 Long term (current) use of aspirin: Secondary | ICD-10-CM | POA: Diagnosis not present

## 2019-02-14 DIAGNOSIS — I739 Peripheral vascular disease, unspecified: Secondary | ICD-10-CM

## 2019-02-14 DIAGNOSIS — Z7984 Long term (current) use of oral hypoglycemic drugs: Secondary | ICD-10-CM | POA: Diagnosis not present

## 2019-02-14 DIAGNOSIS — E119 Type 2 diabetes mellitus without complications: Secondary | ICD-10-CM | POA: Diagnosis not present

## 2019-02-14 DIAGNOSIS — Z7902 Long term (current) use of antithrombotics/antiplatelets: Secondary | ICD-10-CM | POA: Diagnosis not present

## 2019-02-14 DIAGNOSIS — T8753 Necrosis of amputation stump, right lower extremity: Secondary | ICD-10-CM | POA: Diagnosis not present

## 2019-02-14 DIAGNOSIS — Z9181 History of falling: Secondary | ICD-10-CM | POA: Diagnosis not present

## 2019-02-14 DIAGNOSIS — Z79891 Long term (current) use of opiate analgesic: Secondary | ICD-10-CM | POA: Diagnosis not present

## 2019-02-15 DIAGNOSIS — T8189XA Other complications of procedures, not elsewhere classified, initial encounter: Secondary | ICD-10-CM | POA: Diagnosis not present

## 2019-02-15 DIAGNOSIS — T8753 Necrosis of amputation stump, right lower extremity: Secondary | ICD-10-CM | POA: Diagnosis not present

## 2019-02-16 DIAGNOSIS — Z7902 Long term (current) use of antithrombotics/antiplatelets: Secondary | ICD-10-CM | POA: Diagnosis not present

## 2019-02-16 DIAGNOSIS — T8753 Necrosis of amputation stump, right lower extremity: Secondary | ICD-10-CM | POA: Diagnosis not present

## 2019-02-16 DIAGNOSIS — Z7982 Long term (current) use of aspirin: Secondary | ICD-10-CM | POA: Diagnosis not present

## 2019-02-16 DIAGNOSIS — Z79891 Long term (current) use of opiate analgesic: Secondary | ICD-10-CM | POA: Diagnosis not present

## 2019-02-16 DIAGNOSIS — E119 Type 2 diabetes mellitus without complications: Secondary | ICD-10-CM | POA: Diagnosis not present

## 2019-02-16 DIAGNOSIS — Z9181 History of falling: Secondary | ICD-10-CM | POA: Diagnosis not present

## 2019-02-16 DIAGNOSIS — Z7984 Long term (current) use of oral hypoglycemic drugs: Secondary | ICD-10-CM | POA: Diagnosis not present

## 2019-02-17 ENCOUNTER — Other Ambulatory Visit: Payer: Self-pay | Admitting: *Deleted

## 2019-02-17 ENCOUNTER — Ambulatory Visit (HOSPITAL_COMMUNITY)
Admission: RE | Admit: 2019-02-17 | Discharge: 2019-02-17 | Disposition: A | Discharge status: Home / Self Care | Payer: BLUE CROSS/BLUE SHIELD | Source: Ambulatory Visit | Attending: Vascular Surgery | Admitting: Vascular Surgery

## 2019-02-17 ENCOUNTER — Ambulatory Visit (INDEPENDENT_AMBULATORY_CARE_PROVIDER_SITE_OTHER)
Admit: 2019-02-17 | Discharge: 2019-02-17 | Disposition: A | Discharge status: Home / Self Care | Payer: BLUE CROSS/BLUE SHIELD | Attending: Vascular Surgery | Admitting: Vascular Surgery

## 2019-02-17 DIAGNOSIS — I739 Peripheral vascular disease, unspecified: Secondary | ICD-10-CM

## 2019-02-17 MED ORDER — OXYCODONE-ACETAMINOPHEN 5-325 MG PO TABS: 1.0000 | ORAL_TABLET | Freq: Four times a day (QID) | ORAL | 0 refills | Status: DC | PRN

## 2019-02-18 ENCOUNTER — Other Ambulatory Visit: Payer: Self-pay

## 2019-02-18 ENCOUNTER — Encounter: Payer: Self-pay | Admitting: Vascular Surgery

## 2019-02-18 ENCOUNTER — Ambulatory Visit (INDEPENDENT_AMBULATORY_CARE_PROVIDER_SITE_OTHER): Payer: BLUE CROSS/BLUE SHIELD | Admitting: Vascular Surgery

## 2019-02-18 VITALS — BP 123/77 | HR 86 | Temp 97.3°F | Ht 70.0 in | Wt 295.0 lb

## 2019-02-18 DIAGNOSIS — I739 Peripheral vascular disease, unspecified: Secondary | ICD-10-CM

## 2019-02-18 DIAGNOSIS — M79671 Pain in right foot: Secondary | ICD-10-CM

## 2019-02-18 MED ORDER — OXYCODONE-ACETAMINOPHEN 5-325 MG PO TABS: 1.0000 | ORAL_TABLET | Freq: Four times a day (QID) | ORAL | 0 refills | Status: DC | PRN

## 2019-02-18 NOTE — Progress Notes (Signed)
    Subjective:     Patient ID: Derrick Clark, male   DOB: July 17, 1961, 58 y.o.   MRN: [id number]  HPI 58 year old male follows up after stenting of his right SFA and fifth toe amputation that had to be debrided and now has a wound VAC which is being changed by home health.  Pain is improved.  Continues to take Plavix.   Review of Systems Right foot pain    Objective:   Physical Exam Vitals:   02/18/19 1318  BP: 123/77  Pulse: 86  Temp: (!) 97.3 F (36.3 C)  SpO2: 99%    He is awake alert and oriented Palpable dorsalis pedis pulse       Assessment:     58 year old male follows up after right SFA stenting and wound VAC placement after debridement of fifth toe amputation site now healing well as pictured above.    Plan:     I will refill his pain medicine today given that I think he does have real pain exacerbated by wound care.  He will follow-up in 2 to 3 weeks with wound check.  Continue Plavix after at least 3 months.  Willistine Ferrall C. Donzetta Matters, MD [facility] [place] Office: [phone] Pager: [phone]

## 2019-02-19 DIAGNOSIS — Z79891 Long term (current) use of opiate analgesic: Secondary | ICD-10-CM | POA: Diagnosis not present

## 2019-02-19 DIAGNOSIS — Z7984 Long term (current) use of oral hypoglycemic drugs: Secondary | ICD-10-CM | POA: Diagnosis not present

## 2019-02-19 DIAGNOSIS — T8753 Necrosis of amputation stump, right lower extremity: Secondary | ICD-10-CM | POA: Diagnosis not present

## 2019-02-19 DIAGNOSIS — Z7902 Long term (current) use of antithrombotics/antiplatelets: Secondary | ICD-10-CM | POA: Diagnosis not present

## 2019-02-19 DIAGNOSIS — E119 Type 2 diabetes mellitus without complications: Secondary | ICD-10-CM | POA: Diagnosis not present

## 2019-02-19 DIAGNOSIS — Z7982 Long term (current) use of aspirin: Secondary | ICD-10-CM | POA: Diagnosis not present

## 2019-02-19 DIAGNOSIS — Z9181 History of falling: Secondary | ICD-10-CM | POA: Diagnosis not present

## 2019-02-21 DIAGNOSIS — Z9181 History of falling: Secondary | ICD-10-CM | POA: Diagnosis not present

## 2019-02-21 DIAGNOSIS — Z79891 Long term (current) use of opiate analgesic: Secondary | ICD-10-CM | POA: Diagnosis not present

## 2019-02-21 DIAGNOSIS — Z7902 Long term (current) use of antithrombotics/antiplatelets: Secondary | ICD-10-CM | POA: Diagnosis not present

## 2019-02-21 DIAGNOSIS — Z6841 Body Mass Index (BMI) 40.0 and over, adult: Secondary | ICD-10-CM | POA: Diagnosis not present

## 2019-02-21 DIAGNOSIS — T8753 Necrosis of amputation stump, right lower extremity: Secondary | ICD-10-CM | POA: Diagnosis not present

## 2019-02-21 DIAGNOSIS — Z7982 Long term (current) use of aspirin: Secondary | ICD-10-CM | POA: Diagnosis not present

## 2019-02-21 DIAGNOSIS — I739 Peripheral vascular disease, unspecified: Secondary | ICD-10-CM | POA: Diagnosis not present

## 2019-02-21 DIAGNOSIS — Z7984 Long term (current) use of oral hypoglycemic drugs: Secondary | ICD-10-CM | POA: Diagnosis not present

## 2019-02-21 DIAGNOSIS — E119 Type 2 diabetes mellitus without complications: Secondary | ICD-10-CM | POA: Diagnosis not present

## 2019-02-23 ENCOUNTER — Telehealth: Payer: Self-pay | Admitting: Vascular Surgery

## 2019-02-23 DIAGNOSIS — Z79891 Long term (current) use of opiate analgesic: Secondary | ICD-10-CM | POA: Diagnosis not present

## 2019-02-23 DIAGNOSIS — E119 Type 2 diabetes mellitus without complications: Secondary | ICD-10-CM | POA: Diagnosis not present

## 2019-02-23 DIAGNOSIS — Z9181 History of falling: Secondary | ICD-10-CM | POA: Diagnosis not present

## 2019-02-23 DIAGNOSIS — Z7984 Long term (current) use of oral hypoglycemic drugs: Secondary | ICD-10-CM | POA: Diagnosis not present

## 2019-02-23 DIAGNOSIS — Z7902 Long term (current) use of antithrombotics/antiplatelets: Secondary | ICD-10-CM | POA: Diagnosis not present

## 2019-02-23 DIAGNOSIS — T8753 Necrosis of amputation stump, right lower extremity: Secondary | ICD-10-CM | POA: Diagnosis not present

## 2019-02-23 DIAGNOSIS — Z7982 Long term (current) use of aspirin: Secondary | ICD-10-CM | POA: Diagnosis not present

## 2019-02-23 NOTE — Telephone Encounter (Signed)
A Home Health nurse called about this patient today.  She says he has redness and soreness underneath his affected foot with mild odor.  He is currently using wound Vac and is also having pain.  I advised her to have pt call to get 1st avail appt to have a physician look at his foot.  She verbalizes understanding and will contact pt.   Thurston Hole., LPN

## 2019-02-25 ENCOUNTER — Encounter: Payer: Self-pay | Admitting: Family

## 2019-02-25 ENCOUNTER — Other Ambulatory Visit: Payer: Self-pay

## 2019-02-25 ENCOUNTER — Ambulatory Visit (INDEPENDENT_AMBULATORY_CARE_PROVIDER_SITE_OTHER): Payer: BLUE CROSS/BLUE SHIELD | Admitting: Physician Assistant

## 2019-02-25 VITALS — BP 142/89 | HR 83 | Temp 97.5°F | Resp 18 | Ht 70.0 in | Wt 294.0 lb

## 2019-02-25 DIAGNOSIS — Z7902 Long term (current) use of antithrombotics/antiplatelets: Secondary | ICD-10-CM | POA: Diagnosis not present

## 2019-02-25 DIAGNOSIS — Z79891 Long term (current) use of opiate analgesic: Secondary | ICD-10-CM | POA: Diagnosis not present

## 2019-02-25 DIAGNOSIS — Z7982 Long term (current) use of aspirin: Secondary | ICD-10-CM | POA: Diagnosis not present

## 2019-02-25 DIAGNOSIS — E119 Type 2 diabetes mellitus without complications: Secondary | ICD-10-CM | POA: Diagnosis not present

## 2019-02-25 DIAGNOSIS — Z9181 History of falling: Secondary | ICD-10-CM | POA: Diagnosis not present

## 2019-02-25 DIAGNOSIS — T8753 Necrosis of amputation stump, right lower extremity: Secondary | ICD-10-CM | POA: Diagnosis not present

## 2019-02-25 DIAGNOSIS — Z7984 Long term (current) use of oral hypoglycemic drugs: Secondary | ICD-10-CM | POA: Diagnosis not present

## 2019-02-25 DIAGNOSIS — M79671 Pain in right foot: Secondary | ICD-10-CM

## 2019-02-25 DIAGNOSIS — I739 Peripheral vascular disease, unspecified: Secondary | ICD-10-CM

## 2019-02-25 MED ORDER — OXYCODONE-ACETAMINOPHEN 5-325 MG PO TABS: 1.0000 | ORAL_TABLET | Freq: Four times a day (QID) | ORAL | 0 refills | Status: DC | PRN

## 2019-02-25 NOTE — Progress Notes (Signed)
  POST OPERATIVE OFFICE NOTE    CC:  F/u for surgery  HPI:  This is a 58 y.o. male who is s/p angiogram with SFA stent followed by right 5th toe amputation.  He has had a wound vac placed to assist with wound healing.  He continues to complain of pain daily at the amputation site.   He denies changes in his medical history since his last visit.  He denies fever and chills.  Allergies  Allergen Reactions  . Farxiga [Dapagliflozin] Rash  . Jardiance [Empagliflozin] Rash  . Sulfa Antibiotics Rash    Current Outpatient Medications  Medication Sig Dispense Refill  . aspirin 81 MG chewable tablet Chew 81 mg by mouth daily.     . Aspirin-Acetaminophen (GOODYS BODY PAIN PO) Take 1 packet by mouth daily as needed (pain).    . cephALEXin (KEFLEX) 500 MG capsule Take 1 capsule (500 mg total) by mouth 3 (three) times daily. 15 capsule 0  . clopidogrel (PLAVIX) 75 MG tablet Take 1 tablet (75 mg total) by mouth daily with breakfast. 30 tablet 2  . cyclobenzaprine (FLEXERIL) 10 MG tablet Take 10 mg by mouth 3 (three) times daily as needed for muscle spasms.    . Dulaglutide (TRULICITY) 4.50 TU/8.8KC SOPN Inject 0.75 mg into the skin every Sunday.     . gabapentin (NEURONTIN) 600 MG tablet Take 600 mg by mouth 3 (three) times daily as needed (pain).   0  . glipiZIDE (GLUCOTROL XL) 10 MG 24 hr tablet Take 10 mg by mouth daily.    . metFORMIN (GLUCOPHAGE-XR) 500 MG 24 hr tablet Take 1,000 mg by mouth 2 (two) times daily.    Marland Kitchen oxyCODONE-acetaminophen (PERCOCET/ROXICET) 5-325 MG tablet Take 1-2 tablets by mouth every 4 (four) hours as needed for severe pain. 20 tablet 0  . oxyCODONE-acetaminophen (PERCOCET/ROXICET) 5-325 MG tablet Take 1 tablet by mouth every 6 (six) hours as needed. 10 tablet 0  . oxyCODONE-acetaminophen (PERCOCET/ROXICET) 5-325 MG tablet Take 1 tablet by mouth every 6 (six) hours as needed for up to 30 doses for severe pain. 30 tablet 0  . simvastatin (ZOCOR) 40 MG tablet Take 40 mg by  mouth every evening.     Marland Kitchen oxyCODONE-acetaminophen (PERCOCET/ROXICET) 5-325 MG tablet Take 1 tablet by mouth every 6 (six) hours as needed. 30 tablet 0   No current facility-administered medications for this visit.      ROS:  See HPI  Physical Exam:     Palpable right DP pulse.     Assessment/Plan:  This is a 58 y.o. male who is s/p:angiogram with SFA stent followed by right 5th toe amputation.  Wound vac to fifth to amputation site.   Continue wound vac and f/u in 2 weeks for wound check.  He has good bleeding in the wound base when dressing was removed. Skin edges are macerated  and I asked him to point this out to his [facility] RN.     Roxy Horseman PA-C [facility] [phone]  Clinic MD:  Donzetta Matters

## 2019-02-28 DIAGNOSIS — Z7902 Long term (current) use of antithrombotics/antiplatelets: Secondary | ICD-10-CM | POA: Diagnosis not present

## 2019-02-28 DIAGNOSIS — T8753 Necrosis of amputation stump, right lower extremity: Secondary | ICD-10-CM | POA: Diagnosis not present

## 2019-02-28 DIAGNOSIS — E119 Type 2 diabetes mellitus without complications: Secondary | ICD-10-CM | POA: Diagnosis not present

## 2019-02-28 DIAGNOSIS — Z7984 Long term (current) use of oral hypoglycemic drugs: Secondary | ICD-10-CM | POA: Diagnosis not present

## 2019-02-28 DIAGNOSIS — Z79891 Long term (current) use of opiate analgesic: Secondary | ICD-10-CM | POA: Diagnosis not present

## 2019-02-28 DIAGNOSIS — Z7982 Long term (current) use of aspirin: Secondary | ICD-10-CM | POA: Diagnosis not present

## 2019-02-28 DIAGNOSIS — Z9181 History of falling: Secondary | ICD-10-CM | POA: Diagnosis not present

## 2019-03-02 DIAGNOSIS — Z7982 Long term (current) use of aspirin: Secondary | ICD-10-CM | POA: Diagnosis not present

## 2019-03-02 DIAGNOSIS — Z7984 Long term (current) use of oral hypoglycemic drugs: Secondary | ICD-10-CM | POA: Diagnosis not present

## 2019-03-02 DIAGNOSIS — Z79891 Long term (current) use of opiate analgesic: Secondary | ICD-10-CM | POA: Diagnosis not present

## 2019-03-02 DIAGNOSIS — Z7902 Long term (current) use of antithrombotics/antiplatelets: Secondary | ICD-10-CM | POA: Diagnosis not present

## 2019-03-02 DIAGNOSIS — T8753 Necrosis of amputation stump, right lower extremity: Secondary | ICD-10-CM | POA: Diagnosis not present

## 2019-03-02 DIAGNOSIS — E119 Type 2 diabetes mellitus without complications: Secondary | ICD-10-CM | POA: Diagnosis not present

## 2019-03-02 DIAGNOSIS — Z9181 History of falling: Secondary | ICD-10-CM | POA: Diagnosis not present

## 2019-03-04 ENCOUNTER — Telehealth: Payer: Self-pay | Admitting: *Deleted

## 2019-03-04 DIAGNOSIS — Z9181 History of falling: Secondary | ICD-10-CM | POA: Diagnosis not present

## 2019-03-04 DIAGNOSIS — Z7982 Long term (current) use of aspirin: Secondary | ICD-10-CM | POA: Diagnosis not present

## 2019-03-04 DIAGNOSIS — Z79891 Long term (current) use of opiate analgesic: Secondary | ICD-10-CM | POA: Diagnosis not present

## 2019-03-04 DIAGNOSIS — Z7984 Long term (current) use of oral hypoglycemic drugs: Secondary | ICD-10-CM | POA: Diagnosis not present

## 2019-03-04 DIAGNOSIS — T8753 Necrosis of amputation stump, right lower extremity: Secondary | ICD-10-CM | POA: Diagnosis not present

## 2019-03-04 DIAGNOSIS — Z7902 Long term (current) use of antithrombotics/antiplatelets: Secondary | ICD-10-CM | POA: Diagnosis not present

## 2019-03-04 DIAGNOSIS — E119 Type 2 diabetes mellitus without complications: Secondary | ICD-10-CM | POA: Diagnosis not present

## 2019-03-04 NOTE — Telephone Encounter (Signed)
Patient called and requested refill on PERCOCET. Has had Rx for a total of 90 pills since 02/11/2019. Unable to refill. Instructed to continue Neurontin and use non-narcotic medication for pain. Appt with Dr. Donzetta Matters next week.

## 2019-03-07 DIAGNOSIS — Z9181 History of falling: Secondary | ICD-10-CM | POA: Diagnosis not present

## 2019-03-07 DIAGNOSIS — E119 Type 2 diabetes mellitus without complications: Secondary | ICD-10-CM | POA: Diagnosis not present

## 2019-03-07 DIAGNOSIS — Z7902 Long term (current) use of antithrombotics/antiplatelets: Secondary | ICD-10-CM | POA: Diagnosis not present

## 2019-03-07 DIAGNOSIS — T8753 Necrosis of amputation stump, right lower extremity: Secondary | ICD-10-CM | POA: Diagnosis not present

## 2019-03-07 DIAGNOSIS — Z7984 Long term (current) use of oral hypoglycemic drugs: Secondary | ICD-10-CM | POA: Diagnosis not present

## 2019-03-07 DIAGNOSIS — Z79891 Long term (current) use of opiate analgesic: Secondary | ICD-10-CM | POA: Diagnosis not present

## 2019-03-07 DIAGNOSIS — Z7982 Long term (current) use of aspirin: Secondary | ICD-10-CM | POA: Diagnosis not present

## 2019-03-08 DIAGNOSIS — T8189XA Other complications of procedures, not elsewhere classified, initial encounter: Secondary | ICD-10-CM | POA: Diagnosis not present

## 2019-03-09 DIAGNOSIS — Z7902 Long term (current) use of antithrombotics/antiplatelets: Secondary | ICD-10-CM | POA: Diagnosis not present

## 2019-03-09 DIAGNOSIS — Z7982 Long term (current) use of aspirin: Secondary | ICD-10-CM | POA: Diagnosis not present

## 2019-03-09 DIAGNOSIS — Z79891 Long term (current) use of opiate analgesic: Secondary | ICD-10-CM | POA: Diagnosis not present

## 2019-03-09 DIAGNOSIS — Z9181 History of falling: Secondary | ICD-10-CM | POA: Diagnosis not present

## 2019-03-09 DIAGNOSIS — T8753 Necrosis of amputation stump, right lower extremity: Secondary | ICD-10-CM | POA: Diagnosis not present

## 2019-03-09 DIAGNOSIS — E119 Type 2 diabetes mellitus without complications: Secondary | ICD-10-CM | POA: Diagnosis not present

## 2019-03-09 DIAGNOSIS — Z7984 Long term (current) use of oral hypoglycemic drugs: Secondary | ICD-10-CM | POA: Diagnosis not present

## 2019-03-11 ENCOUNTER — Other Ambulatory Visit: Payer: Self-pay

## 2019-03-11 ENCOUNTER — Other Ambulatory Visit: Payer: Self-pay | Admitting: Vascular Surgery

## 2019-03-11 ENCOUNTER — Ambulatory Visit (INDEPENDENT_AMBULATORY_CARE_PROVIDER_SITE_OTHER): Payer: Self-pay | Admitting: Family

## 2019-03-11 ENCOUNTER — Encounter: Payer: Self-pay | Admitting: Family

## 2019-03-11 VITALS — BP 112/70 | HR 85 | Temp 97.6°F | Resp 18 | Ht 69.0 in | Wt 293.0 lb

## 2019-03-11 DIAGNOSIS — Z89421 Acquired absence of other right toe(s): Secondary | ICD-10-CM

## 2019-03-11 DIAGNOSIS — I779 Disorder of arteries and arterioles, unspecified: Secondary | ICD-10-CM

## 2019-03-11 DIAGNOSIS — F17200 Nicotine dependence, unspecified, uncomplicated: Secondary | ICD-10-CM

## 2019-03-11 DIAGNOSIS — Z959 Presence of cardiac and vascular implant and graft, unspecified: Secondary | ICD-10-CM

## 2019-03-11 MED ORDER — CEPHALEXIN 500 MG PO CAPS: 500.0000 mg | ORAL_CAPSULE | Freq: Four times a day (QID) | ORAL | 0 refills | Status: DC

## 2019-03-11 MED ORDER — COLLAGENASE 250 UNIT/GM EX OINT: 1.0000 "application " | TOPICAL_OINTMENT | Freq: Every day | CUTANEOUS | 1 refills | Status: DC

## 2019-03-11 NOTE — Progress Notes (Signed)
[facility] [place]   CC: Follow up peripheral artery occlusive disease  History of Present Illness Derrick JAGIELLO is a 58 y.o. male who is s/p angiogram with right SFA stent on 01-31-19 followed by right 5th toe amputation on 02-01-19 by Dr. Donzetta Matters.  He has had a wound vac placed to assist with wound healing.   He continues to complain of pain daily at the amputation site.   He denies changes in his medical history since his last visit.  He denies fever and chills.  Pt states he took his last oxycodone a week ago, was given 4 scripts for oxycodone from 02-11-19 to 02-25-19.   He is wearing a post op shoe on his right foot. Pt states HH is changing the wound vac to his right foot 3x/week.   Diabetic: Yes, no A1C result on file, however, CBG's are 150-230, uncontrolled  Tobacco use: current smoker, 1/3-1/2 ppd  Pt meds include: Statin :Yes Betablocker: No ASA: Yes Other anticoagulants/antiplatelets: Plavix  Past Medical History:  Diagnosis Date   Abdominal hernia    Arthritis    Diabetes mellitus without complication ([place])    Nonhealing surgical wound    nonviable tissue   PAD (peripheral artery disease) ([place])     Social History Social History   Tobacco Use   Smoking status: Former Smoker    Packs/day: 0.50    Years: 40.00    Pack years: 20.00    Types: Cigarettes    Last attempt to quit: 02/09/2019    Years since quitting: 0.0   Smokeless tobacco: Never Used  Substance Use Topics   Alcohol use: No    Frequency: Never   Drug use: No    Family History Family History  Problem Relation Age of Onset   Diabetes Mother    Heart failure Mother    Cancer Father     Past Surgical History:  Procedure Laterality Date   ABDOMINAL AORTOGRAM W/LOWER EXTREMITY N/A 01/31/2019   Procedure: ABDOMINAL AORTOGRAM W/LOWER EXTREMITY;  Surgeon: Waynetta Sandy, MD;  Location: [facility];  Service: Cardiovascular;  Laterality: N/A;    AMPUTATION Right 02/01/2019   Procedure: AMPUTATION RIGHT FIFTH TOE;  Surgeon: Angelia Mould, MD;  Location: [facility];  Service: Vascular;  Laterality: Right;   AMPUTATION Right 02/10/2019   Procedure: AMPUTATION RAY RIGHT 5TH TOE WITH DEBRIDEMENT WOUND BED;  Surgeon: Waynetta Sandy, MD;  Location: [facility];  Service: Vascular;  Laterality: Right;   AMPUTATION TOE Right 02/10/2019   5TH    PERIPHERAL VASCULAR INTERVENTION Right 01/31/2019   Procedure: PERIPHERAL VASCULAR INTERVENTION;  Surgeon: Waynetta Sandy, MD;  Location: [facility];  Service: Cardiovascular;  Laterality: Right;  SFA   SHOULDER SURGERY Left     Allergies  Allergen Reactions   Farxiga [Dapagliflozin] Rash   Jardiance [Empagliflozin] Rash   Sulfa Antibiotics Rash    Current Outpatient Medications  Medication Sig Dispense Refill   aspirin 81 MG chewable tablet Chew 81 mg by mouth daily.      Aspirin-Acetaminophen (GOODYS BODY PAIN PO) Take 1 packet by mouth daily as needed (pain).     cephALEXin (KEFLEX) 500 MG capsule Take 1 capsule (500 mg total) by mouth 3 (three) times daily. 15 capsule 0   clopidogrel (PLAVIX) 75 MG tablet Take 1 tablet (75 mg total) by mouth daily with breakfast. 30 tablet 2   cyclobenzaprine (FLEXERIL) 10 MG tablet Take 10 mg by mouth  3 (three) times daily as needed for muscle spasms.     Dulaglutide (TRULICITY) 1.61 WR/6.0AV SOPN Inject 0.75 mg into the skin every Sunday.      gabapentin (NEURONTIN) 600 MG tablet Take 600 mg by mouth 3 (three) times daily as needed (pain).   0   glipiZIDE (GLUCOTROL XL) 10 MG 24 hr tablet Take 10 mg by mouth daily.     metFORMIN (GLUCOPHAGE-XR) 500 MG 24 hr tablet Take 1,000 mg by mouth 2 (two) times daily.     simvastatin (ZOCOR) 40 MG tablet Take 40 mg by mouth every evening.      oxyCODONE-acetaminophen (PERCOCET/ROXICET) 5-325 MG tablet Take 1-2 tablets by mouth every 4 (four) hours as needed for severe pain.  (Patient not taking: Reported on 03/11/2019) 20 tablet 0   oxyCODONE-acetaminophen (PERCOCET/ROXICET) 5-325 MG tablet Take 1 tablet by mouth every 6 (six) hours as needed. (Patient not taking: Reported on 03/11/2019) 10 tablet 0   oxyCODONE-acetaminophen (PERCOCET/ROXICET) 5-325 MG tablet Take 1 tablet by mouth every 6 (six) hours as needed for up to 30 doses for severe pain. (Patient not taking: Reported on 03/11/2019) 30 tablet 0   oxyCODONE-acetaminophen (PERCOCET/ROXICET) 5-325 MG tablet Take 1 tablet by mouth every 6 (six) hours as needed. (Patient not taking: Reported on 03/11/2019) 30 tablet 0   No current facility-administered medications for this visit.     ROS: See HPI for pertinent positives and negatives.   Physical Examination  Vitals:   03/11/19 1336  BP: 112/70  Pulse: 85  Resp: 18  Temp: 97.6 F (36.4 C)  TempSrc: Oral  SpO2: 96%  Weight: 293 lb (132.9 kg)  Height: 5\' 9"  (1.753 m)   Body mass index is 43.27 kg/m.  General: A&O x 3, WDWN, morbidly obese male. Less than ideal overall hygiene.  Gait: limp HENT: No gross abnormalities. Large neck Eyes: PERRLA. Pulmonary: Respirations are non labored, limited air movement in all fields, no rales, rhonchi, or wheezes. Cardiac: regular rhythm, no detected murmur.          Right 5th toe amputation site    Adominal aortic pulse is not palpable                VASCULAR EXAM: Extremities without ischemic changes, without Gangrene; with open wound at right 5th toe amputation site which has a slight malodor. However, mostly beefy red tissue at wound base.                                                                                                           LE Pulses Right Left       FEMORAL  2+ palpable  2+ palpable        POPLITEAL  not palpable   not palpable       POSTERIOR TIBIAL  not palpable   not palpable        DORSALIS PEDIS      ANTERIOR TIBIAL 2+ palpable  2+ palpable    Abdomen: softly obese,  NT, no palpable masses. Skin: no  rashes, no cellulitis, no ulcers noted. See above photo Musculoskeletal: no muscle wasting or atrophy.  Neurologic: A&O X 3; appropriate affect, Sensation is normal; MOTOR FUNCTION:  moving all extremities equally, motor strength 5/5 throughout. Speech is fluent/normal. CN 2-12 intact. Psychiatric: Thought content is normal, mood appropriate for clinical situation.     ASSESSMENT: Derrick Clark is a 58 y.o. male who is s/p angiogram with right SFA stent on 01-31-19 followed by right 5th toe amputation on 02-01-19 by Dr. Donzetta Matters.   Mild malodor to right 5th toe amputation site. Mostly beefy red tissue at wound base, little necrotic tissue, no purulent drainage.   Pt states HH is visiting 3x/week and changing wound vac dressing.    Dr. Donzetta Matters spoke with and examined pt.   HH: stop wound vac, start Santyl dressings to debride right 5th toe amputation site.  Start Keflex 500 mg po qid x 2 weeks, disp #56, 0 refills.    PLAN:  Pt will return to clinic in 2 weeks for recheck, see Dr. Donzetta Matters.   I discussed in depth with the patient the nature of atherosclerosis, and emphasized the importance of maximal medical management including strict control of blood pressure, blood glucose, and lipid levels, obtaining regular exercise, and cessation of smoking.  The patient is aware that without maximal medical management the underlying atherosclerotic disease process will progress, limiting the benefit of any interventions.  The patient was given information about PAD including signs, symptoms, treatment, what symptoms should prompt the patient to seek immediate medical care, and risk reduction measures to take.  Clemon Chambers, RN, MSN, FNP-C [facility] Phone: [phone]  Clinic MD: Donzetta Matters  03/11/19 1:41 PM

## 2019-03-13 DIAGNOSIS — T8189XA Other complications of procedures, not elsewhere classified, initial encounter: Secondary | ICD-10-CM | POA: Diagnosis not present

## 2019-03-14 DIAGNOSIS — E119 Type 2 diabetes mellitus without complications: Secondary | ICD-10-CM | POA: Diagnosis not present

## 2019-03-14 DIAGNOSIS — Z7902 Long term (current) use of antithrombotics/antiplatelets: Secondary | ICD-10-CM | POA: Diagnosis not present

## 2019-03-14 DIAGNOSIS — Z79891 Long term (current) use of opiate analgesic: Secondary | ICD-10-CM | POA: Diagnosis not present

## 2019-03-14 DIAGNOSIS — T8753 Necrosis of amputation stump, right lower extremity: Secondary | ICD-10-CM | POA: Diagnosis not present

## 2019-03-14 DIAGNOSIS — Z9181 History of falling: Secondary | ICD-10-CM | POA: Diagnosis not present

## 2019-03-14 DIAGNOSIS — Z7982 Long term (current) use of aspirin: Secondary | ICD-10-CM | POA: Diagnosis not present

## 2019-03-14 DIAGNOSIS — Z7984 Long term (current) use of oral hypoglycemic drugs: Secondary | ICD-10-CM | POA: Diagnosis not present

## 2019-03-16 ENCOUNTER — Other Ambulatory Visit: Payer: Self-pay | Admitting: Vascular Surgery

## 2019-03-16 DIAGNOSIS — Z7984 Long term (current) use of oral hypoglycemic drugs: Secondary | ICD-10-CM | POA: Diagnosis not present

## 2019-03-16 DIAGNOSIS — Z9181 History of falling: Secondary | ICD-10-CM | POA: Diagnosis not present

## 2019-03-16 DIAGNOSIS — T8753 Necrosis of amputation stump, right lower extremity: Secondary | ICD-10-CM | POA: Diagnosis not present

## 2019-03-16 DIAGNOSIS — E119 Type 2 diabetes mellitus without complications: Secondary | ICD-10-CM | POA: Diagnosis not present

## 2019-03-16 DIAGNOSIS — Z7902 Long term (current) use of antithrombotics/antiplatelets: Secondary | ICD-10-CM | POA: Diagnosis not present

## 2019-03-16 DIAGNOSIS — Z7982 Long term (current) use of aspirin: Secondary | ICD-10-CM | POA: Diagnosis not present

## 2019-03-16 DIAGNOSIS — Z79891 Long term (current) use of opiate analgesic: Secondary | ICD-10-CM | POA: Diagnosis not present

## 2019-03-17 ENCOUNTER — Other Ambulatory Visit: Payer: Self-pay | Admitting: Vascular Surgery

## 2019-03-17 MED ORDER — CADEXOMER IODINE 0.9 % EX GEL: 1.0000 "application " | Freq: Every day | CUTANEOUS | 2 refills | Status: DC | PRN

## 2019-03-18 DIAGNOSIS — Z9181 History of falling: Secondary | ICD-10-CM | POA: Diagnosis not present

## 2019-03-18 DIAGNOSIS — E119 Type 2 diabetes mellitus without complications: Secondary | ICD-10-CM | POA: Diagnosis not present

## 2019-03-18 DIAGNOSIS — Z7984 Long term (current) use of oral hypoglycemic drugs: Secondary | ICD-10-CM | POA: Diagnosis not present

## 2019-03-18 DIAGNOSIS — Z7902 Long term (current) use of antithrombotics/antiplatelets: Secondary | ICD-10-CM | POA: Diagnosis not present

## 2019-03-18 DIAGNOSIS — Z79891 Long term (current) use of opiate analgesic: Secondary | ICD-10-CM | POA: Diagnosis not present

## 2019-03-18 DIAGNOSIS — T8753 Necrosis of amputation stump, right lower extremity: Secondary | ICD-10-CM | POA: Diagnosis not present

## 2019-03-18 DIAGNOSIS — Z7982 Long term (current) use of aspirin: Secondary | ICD-10-CM | POA: Diagnosis not present

## 2019-03-21 DIAGNOSIS — E119 Type 2 diabetes mellitus without complications: Secondary | ICD-10-CM | POA: Diagnosis not present

## 2019-03-21 DIAGNOSIS — Z7984 Long term (current) use of oral hypoglycemic drugs: Secondary | ICD-10-CM | POA: Diagnosis not present

## 2019-03-21 DIAGNOSIS — Z79891 Long term (current) use of opiate analgesic: Secondary | ICD-10-CM | POA: Diagnosis not present

## 2019-03-21 DIAGNOSIS — Z7982 Long term (current) use of aspirin: Secondary | ICD-10-CM | POA: Diagnosis not present

## 2019-03-21 DIAGNOSIS — Z7902 Long term (current) use of antithrombotics/antiplatelets: Secondary | ICD-10-CM | POA: Diagnosis not present

## 2019-03-21 DIAGNOSIS — T8753 Necrosis of amputation stump, right lower extremity: Secondary | ICD-10-CM | POA: Diagnosis not present

## 2019-03-21 DIAGNOSIS — Z9181 History of falling: Secondary | ICD-10-CM | POA: Diagnosis not present

## 2019-03-24 ENCOUNTER — Telehealth (HOSPITAL_COMMUNITY): Payer: Self-pay | Admitting: Rehabilitation

## 2019-03-24 NOTE — Telephone Encounter (Signed)
The above patient or their representative was contacted and gave the following answers to these questions:         Do you have any of the following symptoms? No  Fever                    Cough                   Shortness of breath  Do  you have any of the following other symptoms? No   muscle pain         vomiting,        diarrhea        rash         weakness        red eye        abdominal pain         bruising          bruising or bleeding              joint pain           severe headache    Have you been in contact with someone who was or has been sick in the past 2 weeks? No  Yes                 Unsure                         Unable to assess   Does the person that you were in contact with have any of the following symptoms? N/A  Cough         shortness of breath           muscle pain         vomiting,            diarrhea            rash            weakness           fever            red eye           abdominal pain           bruising  or  bleeding                joint pain                severe headache               Have you  or someone you have been in contact with traveled internationally in th last month?   No      If yes, which countries?   Have you  or someone you have been in contact with traveled outside [place] in th last month?   No      If yes, which state and city?   COMMENTS OR ACTION PLAN FOR THIS PATIENT:

## 2019-03-25 ENCOUNTER — Encounter: Payer: Self-pay | Admitting: Vascular Surgery

## 2019-03-25 ENCOUNTER — Ambulatory Visit (INDEPENDENT_AMBULATORY_CARE_PROVIDER_SITE_OTHER): Payer: Self-pay | Admitting: Vascular Surgery

## 2019-03-25 ENCOUNTER — Other Ambulatory Visit: Payer: Self-pay

## 2019-03-25 VITALS — BP 124/85 | HR 81 | Temp 98.1°F | Resp 20 | Ht 69.0 in | Wt 296.0 lb

## 2019-03-25 DIAGNOSIS — I779 Disorder of arteries and arterioles, unspecified: Secondary | ICD-10-CM

## 2019-03-25 DIAGNOSIS — Z959 Presence of cardiac and vascular implant and graft, unspecified: Secondary | ICD-10-CM

## 2019-03-25 NOTE — Progress Notes (Signed)
    Subjective:     Patient ID: NIC LAMPE, male   DOB: 05-Mar-1961, 58 y.o.   MRN: [id number]  HPI Mr. Dockter follows up for wound evaluation.  Previously underwent stenting right SFA with nonpalpable pulse postop.  Fifth amputation site is failed to heal.  Now receiving Santyl with wet-to-dry dressings once weekly with home health care doing the rest himself daily.  He is taking aspirin Plavix and a statin.  He has quit smoking.   Review of Systems No complaints today.    Objective:   Physical Exam  Vitals:   03/25/19 0830  BP: 124/85  Pulse: 81  Resp: 20  Temp: 98.1 F (36.7 C)  SpO2: 95%  Palpable right dorsalis pedis pulse         Assessment:     58 year old male status post revascularization right lower extremity now with healing fifth toe amputation site and has quit smoking on aspirin Plavix and statin.    Plan:     Expect wound will heal. Follow-up in 3 months with repeat ABI and right lower extremity duplex.     Kae Lauman C. Donzetta Matters, MD [facility] [place] Office: [phone] Pager: [phone]

## 2019-03-30 DIAGNOSIS — Z7902 Long term (current) use of antithrombotics/antiplatelets: Secondary | ICD-10-CM | POA: Diagnosis not present

## 2019-03-30 DIAGNOSIS — Z7984 Long term (current) use of oral hypoglycemic drugs: Secondary | ICD-10-CM | POA: Diagnosis not present

## 2019-03-30 DIAGNOSIS — Z7982 Long term (current) use of aspirin: Secondary | ICD-10-CM | POA: Diagnosis not present

## 2019-03-30 DIAGNOSIS — E119 Type 2 diabetes mellitus without complications: Secondary | ICD-10-CM | POA: Diagnosis not present

## 2019-03-30 DIAGNOSIS — Z79891 Long term (current) use of opiate analgesic: Secondary | ICD-10-CM | POA: Diagnosis not present

## 2019-03-30 DIAGNOSIS — T8753 Necrosis of amputation stump, right lower extremity: Secondary | ICD-10-CM | POA: Diagnosis not present

## 2019-03-30 DIAGNOSIS — Z9181 History of falling: Secondary | ICD-10-CM | POA: Diagnosis not present

## 2019-04-05 DIAGNOSIS — T8753 Necrosis of amputation stump, right lower extremity: Secondary | ICD-10-CM | POA: Diagnosis not present

## 2019-04-05 DIAGNOSIS — Z7984 Long term (current) use of oral hypoglycemic drugs: Secondary | ICD-10-CM | POA: Diagnosis not present

## 2019-04-05 DIAGNOSIS — Z7902 Long term (current) use of antithrombotics/antiplatelets: Secondary | ICD-10-CM | POA: Diagnosis not present

## 2019-04-05 DIAGNOSIS — E119 Type 2 diabetes mellitus without complications: Secondary | ICD-10-CM | POA: Diagnosis not present

## 2019-04-05 DIAGNOSIS — Z9181 History of falling: Secondary | ICD-10-CM | POA: Diagnosis not present

## 2019-04-05 DIAGNOSIS — Z7982 Long term (current) use of aspirin: Secondary | ICD-10-CM | POA: Diagnosis not present

## 2019-04-05 DIAGNOSIS — Z79891 Long term (current) use of opiate analgesic: Secondary | ICD-10-CM | POA: Diagnosis not present

## 2019-04-11 DIAGNOSIS — E8881 Metabolic syndrome: Secondary | ICD-10-CM | POA: Diagnosis not present

## 2019-04-11 DIAGNOSIS — E1142 Type 2 diabetes mellitus with diabetic polyneuropathy: Secondary | ICD-10-CM | POA: Diagnosis not present

## 2019-04-11 DIAGNOSIS — F17210 Nicotine dependence, cigarettes, uncomplicated: Secondary | ICD-10-CM | POA: Diagnosis not present

## 2019-04-11 DIAGNOSIS — E782 Mixed hyperlipidemia: Secondary | ICD-10-CM | POA: Diagnosis not present

## 2019-04-13 DIAGNOSIS — T8753 Necrosis of amputation stump, right lower extremity: Secondary | ICD-10-CM | POA: Diagnosis not present

## 2019-04-13 DIAGNOSIS — Z7982 Long term (current) use of aspirin: Secondary | ICD-10-CM | POA: Diagnosis not present

## 2019-04-13 DIAGNOSIS — Z9181 History of falling: Secondary | ICD-10-CM | POA: Diagnosis not present

## 2019-04-13 DIAGNOSIS — Z7902 Long term (current) use of antithrombotics/antiplatelets: Secondary | ICD-10-CM | POA: Diagnosis not present

## 2019-04-13 DIAGNOSIS — Z7984 Long term (current) use of oral hypoglycemic drugs: Secondary | ICD-10-CM | POA: Diagnosis not present

## 2019-04-13 DIAGNOSIS — E119 Type 2 diabetes mellitus without complications: Secondary | ICD-10-CM | POA: Diagnosis not present

## 2019-04-13 DIAGNOSIS — Z79891 Long term (current) use of opiate analgesic: Secondary | ICD-10-CM | POA: Diagnosis not present

## 2019-04-14 DIAGNOSIS — E8881 Metabolic syndrome: Secondary | ICD-10-CM | POA: Diagnosis not present

## 2019-04-14 DIAGNOSIS — M545 Low back pain: Secondary | ICD-10-CM | POA: Diagnosis not present

## 2019-04-14 DIAGNOSIS — E1165 Type 2 diabetes mellitus with hyperglycemia: Secondary | ICD-10-CM | POA: Diagnosis not present

## 2019-04-14 DIAGNOSIS — E782 Mixed hyperlipidemia: Secondary | ICD-10-CM | POA: Diagnosis not present

## 2019-04-14 DIAGNOSIS — F17210 Nicotine dependence, cigarettes, uncomplicated: Secondary | ICD-10-CM | POA: Diagnosis not present

## 2019-05-02 DIAGNOSIS — Z0279 Encounter for issue of other medical certificate: Secondary | ICD-10-CM

## 2019-05-10 DIAGNOSIS — M67919 Unspecified disorder of synovium and tendon, unspecified shoulder: Secondary | ICD-10-CM | POA: Diagnosis not present

## 2019-05-10 DIAGNOSIS — M719 Bursopathy, unspecified: Secondary | ICD-10-CM | POA: Diagnosis not present

## 2019-06-21 ENCOUNTER — Other Ambulatory Visit: Payer: Self-pay

## 2019-06-21 DIAGNOSIS — I779 Disorder of arteries and arterioles, unspecified: Secondary | ICD-10-CM

## 2019-06-24 ENCOUNTER — Ambulatory Visit (HOSPITAL_COMMUNITY): Admission: RE | Admit: 2019-06-24 | Payer: BC Managed Care – PPO | Source: Ambulatory Visit

## 2019-06-24 ENCOUNTER — Ambulatory Visit: Payer: BLUE CROSS/BLUE SHIELD | Admitting: Family

## 2019-06-24 ENCOUNTER — Encounter (HOSPITAL_COMMUNITY): Payer: BLUE CROSS/BLUE SHIELD

## 2019-06-24 ENCOUNTER — Ambulatory Visit: Payer: BC Managed Care – PPO | Admitting: Family

## 2019-06-24 ENCOUNTER — Ambulatory Visit (HOSPITAL_COMMUNITY): Payer: BC Managed Care – PPO

## 2019-07-21 ENCOUNTER — Telehealth (HOSPITAL_COMMUNITY): Payer: Self-pay

## 2019-07-21 NOTE — Telephone Encounter (Signed)
The above patient or their representative was contacted and gave the following answers to these questions:         Do you have any of the following symptoms? No  Fever                    Cough                   Shortness of breath  Do  you have any of the following other symptoms?    muscle pain         vomiting,        diarrhea        rash         weakness        red eye        abdominal pain         bruising          bruising or bleeding              joint pain           severe headache    Have you been in contact with someone who was or has been sick in the past 2 weeks? No  Yes                 Unsure                         Unable to assess   Does the person that you were in contact with have any of the following symptoms?   Cough         shortness of breath           muscle pain         vomiting,            diarrhea            rash            weakness           fever            red eye           abdominal pain           bruising  or  bleeding                joint pain                severe headache               Have you  or someone you have been in contact with traveled internationally in th last month?  No        If yes, which countries?   Have you  or someone you have been in contact with traveled outside [place] in th last month? No         If yes, which state and city?   COMMENTS OR ACTION PLAN FOR THIS PATIENT:          

## 2019-07-22 ENCOUNTER — Ambulatory Visit (INDEPENDENT_AMBULATORY_CARE_PROVIDER_SITE_OTHER): Payer: BC Managed Care – PPO | Admitting: Family

## 2019-07-22 ENCOUNTER — Ambulatory Visit (INDEPENDENT_AMBULATORY_CARE_PROVIDER_SITE_OTHER)
Admission: RE | Admit: 2019-07-22 | Discharge: 2019-07-22 | Disposition: A | Discharge status: Home / Self Care | Payer: BC Managed Care – PPO | Source: Ambulatory Visit | Attending: Family | Admitting: Family

## 2019-07-22 ENCOUNTER — Ambulatory Visit (HOSPITAL_COMMUNITY)
Admission: RE | Admit: 2019-07-22 | Discharge: 2019-07-22 | Disposition: A | Discharge status: Home / Self Care | Payer: BC Managed Care – PPO | Source: Ambulatory Visit | Attending: Family | Admitting: Family

## 2019-07-22 ENCOUNTER — Encounter: Payer: Self-pay | Admitting: Family

## 2019-07-22 ENCOUNTER — Other Ambulatory Visit: Payer: Self-pay

## 2019-07-22 VITALS — BP 127/72 | HR 70 | Temp 97.7°F | Resp 18 | Ht 70.0 in | Wt 294.0 lb

## 2019-07-22 DIAGNOSIS — I779 Disorder of arteries and arterioles, unspecified: Secondary | ICD-10-CM

## 2019-07-22 DIAGNOSIS — E1151 Type 2 diabetes mellitus with diabetic peripheral angiopathy without gangrene: Secondary | ICD-10-CM

## 2019-07-22 DIAGNOSIS — Z89421 Acquired absence of other right toe(s): Secondary | ICD-10-CM

## 2019-07-22 DIAGNOSIS — Z959 Presence of cardiac and vascular implant and graft, unspecified: Secondary | ICD-10-CM | POA: Diagnosis not present

## 2019-07-22 DIAGNOSIS — Z87891 Personal history of nicotine dependence: Secondary | ICD-10-CM

## 2019-07-22 NOTE — Progress Notes (Signed)
[facility]   CC: Follow up peripheral artery occlusive disease  History of Present Illness Derrick Clark is a 58 y.o. male who is s/p angiogram with right SFA stent on 01-31-19 followed by right 5th toe amputation on 02-01-19 by Dr. Donzetta Matters. He has had a wound vac placed to assist with wound healing.   He continues to complain of pain daily at the amputation site which is now well healed. He has no claudication sx's with walking.   Dr. Donzetta Matters last evaluated pt on 03-25-19. At that time he expected wound would heal. Pt was to follow-up in 3 months with repeat ABI and right lower extremity duplex.  He denies fever and chills.  Diabetic: Yes, states his last A1C was 7.6 Tobacco use: former smoker, quit in March 2020, smoked x 35 years  Pt meds include: Statin :Yes Betablocker: No ASA: Yes Other anticoagulants/antiplatelets: Plavix   Past Medical History:  Diagnosis Date  . Abdominal hernia   . Arthritis   . Diabetes mellitus without complication ([facility])   . Nonhealing surgical wound    nonviable tissue  . PAD (peripheral artery disease) (HCC)     Social History Social History   Tobacco Use  . Smoking status: Former Smoker    Packs/day: 0.50    Years: 40.00    Pack years: 20.00    Types: Cigarettes    Quit date: 02/09/2019    Years since quitting: 0.4  . Smokeless tobacco: Never Used  Substance Use Topics  . Alcohol use: No    Frequency: Never  . Drug use: No    Family History Family History  Problem Relation Age of Onset  . Diabetes Mother   . Heart failure Mother   . Cancer Father     Past Surgical History:  Procedure Laterality Date  . ABDOMINAL AORTOGRAM W/LOWER EXTREMITY N/A 01/31/2019   Procedure: ABDOMINAL AORTOGRAM W/LOWER EXTREMITY;  Surgeon: Waynetta Sandy, MD;  Location: [facility];  Service: Cardiovascular;  Laterality: N/A;  . AMPUTATION Right 02/01/2019   Procedure: AMPUTATION RIGHT FIFTH TOE;  Surgeon:  Angelia Mould, MD;  Location: [facility];  Service: Vascular;  Laterality: Right;  . AMPUTATION Right 02/10/2019   Procedure: AMPUTATION RAY RIGHT 5TH TOE WITH DEBRIDEMENT WOUND BED;  Surgeon: Waynetta Sandy, MD;  Location: [facility];  Service: Vascular;  Laterality: Right;  . AMPUTATION TOE Right 02/10/2019   5TH   . PERIPHERAL VASCULAR INTERVENTION Right 01/31/2019   Procedure: PERIPHERAL VASCULAR INTERVENTION;  Surgeon: Waynetta Sandy, MD;  Location: [facility];  Service: Cardiovascular;  Laterality: Right;  SFA  . SHOULDER SURGERY Left     Allergies  Allergen Reactions  . Farxiga [Dapagliflozin] Rash  . Jardiance [Empagliflozin] Rash  . Sulfa Antibiotics Rash    Current Outpatient Medications  Medication Sig Dispense Refill  . aspirin 81 MG chewable tablet Chew 81 mg by mouth daily.     . clopidogrel (PLAVIX) 75 MG tablet Take 1 tablet (75 mg total) by mouth daily with breakfast. 30 tablet 2  . cyclobenzaprine (FLEXERIL) 10 MG tablet Take 10 mg by mouth 3 (three) times daily as needed for muscle spasms.    Marland Kitchen gabapentin (NEURONTIN) 600 MG tablet Take 600 mg by mouth 3 (three) times daily as needed (pain).   0  . glipiZIDE (GLUCOTROL XL) 10 MG 24 hr tablet Take 10 mg by mouth daily.    . metFORMIN (GLUCOPHAGE-XR) 500 MG 24  hr tablet Take 1,000 mg by mouth 2 (two) times daily.    . simvastatin (ZOCOR) 40 MG tablet Take 40 mg by mouth every evening.     . Aspirin-Acetaminophen (GOODYS BODY PAIN PO) Take 1 packet by mouth daily as needed (pain).    . cadexomer iodine (IODOSORB) 0.9 % gel Apply 1 application topically daily as needed for wound care. (Patient not taking: Reported on 07/22/2019) 40 g 2  . cephALEXin (KEFLEX) 500 MG capsule Take 1 capsule (500 mg total) by mouth 4 (four) times daily. (Patient not taking: Reported on 07/22/2019) 56 capsule 0  . collagenase (SANTYL) ointment Apply 1 application topically daily. To right 5th toe amputation site, change  dressing daily. Stop wound vac (Patient not taking: Reported on 07/22/2019) 30 g 1  . Dulaglutide (TRULICITY) 1.61 WR/6.0AV SOPN Inject 0.75 mg into the skin every Sunday.     Marland Kitchen oxyCODONE-acetaminophen (PERCOCET/ROXICET) 5-325 MG tablet Take 1 tablet by mouth every 6 (six) hours as needed. (Patient not taking: Reported on 03/11/2019) 10 tablet 0   No current facility-administered medications for this visit.     ROS: See HPI for pertinent positives and negatives.   Physical Examination  Vitals:   07/22/19 1114  BP: 127/72  Pulse: 70  Resp: 18  Temp: 97.7 F (36.5 C)  TempSrc: Temporal  SpO2: 97%  Weight: 294 lb (133.4 kg)  Height: 5\' 10"  (1.778 m)   Body mass index is 42.18 kg/m.  General: A&O x 3, WDWN, morbidly obese male. Gait: normal HEENT: No gross abnormalities. Large neck  Pulmonary: Respirations are non labored, CTAB, no rales, rhonchi, or wheezing. Distant breath sounds.  Cardiac: regular rhythm, no detected murmur.         Carotid Bruits Right Left   Negative Negative   Radial pulses are palpable bilaterally   Adominal aortic pulse is not palpable                         VASCULAR EXAM: Extremities without ischemic changes, without Gangrene; without open wounds. Right 5th toe amputation site is well healed.                                                                                                           LE Pulses Right Left       FEMORAL  1+ palpable  1+ palpable        POPLITEAL  not palpable   not palpable       POSTERIOR TIBIAL  2+ palpable   2+ palpable        DORSALIS PEDIS      ANTERIOR TIBIAL 2+ palpable  2+ palpable    Abdomen: softly distended, NT, no palpable masses. Skin: no rashes, no cellulitis, no ulcers noted. Musculoskeletal: no muscle wasting or atrophy. See Extremities.  Neurologic: A&O X 3; appropriate affect, Sensation is normal; MOTOR FUNCTION:  moving all extremities equally, motor strength 5/5 throughout. Speech is  fluent/normal. CN 2-12 intact. Psychiatric: Thought content is normal, mood appropriate  for clinical situation.     DATA  Right LE Arterial Duplex (07-22-19): Right Stent(s): +---------------+--------+--------+---------+--------+ SFA            PSV cm/sStenosisWaveform Comments +---------------+--------+--------+---------+--------+ Prox to Stent  145             triphasic         +---------------+--------+--------+---------+--------+ Proximal Stent 226             triphasic         +---------------+--------+--------+---------+--------+ Mid Stent      161             triphasic         +---------------+--------+--------+---------+--------+ Distal Stent   160             triphasic         +---------------+--------+--------+---------+--------+ Distal to [id number]             triphasic         +---------------+--------+--------+---------+--------+  Summary: Right: Patent stent with no evidence of stenosis in the Right SFA artery. No significant change compared to previous study.   ABI (Date: 07/22/2019): ABI Findings: +---------+------------------+-----+---------+--------+ Right    Rt Pressure (mmHg)IndexWaveform Comment  +---------+------------------+-----+---------+--------+ Brachial 151                    triphasic         +---------+------------------+-----+---------+--------+ PTA      153               0.97 triphasic         +---------+------------------+-----+---------+--------+ DP       163               1.04 triphasic         +---------+------------------+-----+---------+--------+ Great Toe121               0.77 Normal            +---------+------------------+-----+---------+--------+  +---------+------------------+-----+---------+-------+ Left     Lt Pressure (mmHg)IndexWaveform Comment +---------+------------------+-----+---------+-------+ Brachial 157                    triphasic         +---------+------------------+-----+---------+-------+ PTA      161               1.03 triphasic        +---------+------------------+-----+---------+-------+ DP       151               0.96 triphasic        +---------+------------------+-----+---------+-------+ [place]               0.75 Normal           +---------+------------------+-----+---------+-------+  +-------+-----------+-----------+------------+------------+ ABI/TBIToday's ABIToday's TBIPrevious ABIPrevious TBI +-------+-----------+-----------+------------+------------+ Right  1.04       0.77       1.13        0.76         +-------+-----------+-----------+------------+------------+ Left   1.03       0.75       1.08        0.62         +-------+-----------+-----------+------------+------------+  Bilateral ABIs appear essentially unchanged compared to prior study on 02/17/2019. Right TBIs appear essentially unchanged compared to prior study on 02/17/2019. Left ABIs appear increased compared to prior study on 02/17/2019.   Summary: Right: Resting right ankle-brachial index is within normal range. No evidence of significant right lower extremity arterial disease. The  right toe-brachial index is normal. RT great toe pressure = 121 mmHg.  Left: Resting left ankle-brachial index is within normal range. No evidence of significant left lower extremity arterial disease. The left toe-brachial index is normal. LT Great toe pressure = 117 mmHg.  ASSESSMENT: Derrick Clark is a 58 y.o. male  who is s/p angiogram with right SFA stent on 01-31-19 followed by right 5th toe amputation on 02-01-19 by Dr. Donzetta Matters.   His right 5th toe amputation site is now well healed. He has pain at the site, has no claudication sx's with walking, there are no signs of ischemia in his feet or legs.  His pedal pulses are palpable.  Duplex or right leg shows no arterial stenosis, all triphasic waveforms.  ABI's and TBI's are  normal will all triphasic waveforms.     PLAN:  Based on the patient's vascular studies and examination, pt will return to clinic in 3 months with ABI's and right LE arterial duplex. I advised him to notify us if he develops concerns re the circulation in his feet or legs.  Gradually increase walking to a total of at least 30 minutes total daily.   I discussed in depth with the patient the nature of atherosclerosis, and emphasized the importance of maximal medical management including strict control of blood pressure, blood glucose, and lipid levels, obtaining regular exercise, and continued cessation of smoking.  The patient is aware that without maximal medical management the underlying atherosclerotic disease process will progress, limiting the benefit of any interventions.  The patient was given information about PAD including signs, symptoms, treatment, what symptoms should prompt the patient to seek immediate medical care, and risk reduction measures to take.  Clemon Chambers, RN, MSN, FNP-C [facility] Phone: [phone]  Clinic MD: Donzetta Matters  07/22/19 11:50 AM

## 2019-07-22 NOTE — Patient Instructions (Signed)
Peripheral Vascular Disease  Peripheral vascular disease (PVD) is a disease of the blood vessels that are not part of your heart and brain. A simple term for PVD is poor circulation. In most cases, PVD narrows the blood vessels that carry blood from your heart to the rest of your body. This can reduce the supply of blood to your arms, legs, and internal organs, like your stomach or kidneys. However, PVD most often affects a person's lower legs and feet. Without treatment, PVD tends to get worse. PVD can also lead to acute ischemic limb. This is when an arm or leg suddenly cannot get enough blood. This is a medical emergency. Follow these instructions at home: Lifestyle  Do not use any products that contain nicotine or tobacco, such as cigarettes and e-cigarettes. If you need help quitting, ask your doctor.  Lose weight if you are overweight. Or, stay at a healthy weight as told by your doctor.  Eat a diet that is low in fat and cholesterol. If you need help, ask your doctor.  Exercise regularly. Ask your doctor for activities that are right for you. General instructions  Take over-the-counter and prescription medicines only as told by your doctor.  Take good care of your feet: ? Wear comfortable shoes that fit well. ? Check your feet often for any cuts or sores.  Keep all follow-up visits as told by your doctor This is important. Contact a doctor if:  You have cramps in your legs when you walk.  You have leg pain when you are at rest.  You have coldness in a leg or foot.  Your skin changes.  You are unable to get or have an erection (erectile dysfunction).  You have cuts or sores on your feet that do not heal. Get help right away if:  Your arm or leg turns cold, numb, and blue.  Your arms or legs become red, warm, swollen, painful, or numb.  You have chest pain.  You have trouble breathing.  You suddenly have weakness in your face, arm, or leg.  You become very  confused or you cannot speak.  You suddenly have a very bad headache.  You suddenly cannot see. Summary  Peripheral vascular disease (PVD) is a disease of the blood vessels.  A simple term for PVD is poor circulation. Without treatment, PVD tends to get worse.  Treatment may include exercise, low fat and low cholesterol diet, and quitting smoking. This information is not intended to replace advice given to you by your health care provider. Make sure you discuss any questions you have with your health care provider. Document Released: 03/11/2010 Document Revised: 11/27/2017 Document Reviewed: 01/22/2017 Elsevier Patient Education  2020 [employer]  

## 2019-10-24 ENCOUNTER — Other Ambulatory Visit: Payer: Self-pay

## 2019-10-24 DIAGNOSIS — I779 Disorder of arteries and arterioles, unspecified: Secondary | ICD-10-CM

## 2019-10-27 ENCOUNTER — Telehealth (HOSPITAL_COMMUNITY): Payer: Self-pay | Admitting: *Deleted

## 2019-10-27 NOTE — Telephone Encounter (Signed)

## 2019-10-28 ENCOUNTER — Ambulatory Visit (HOSPITAL_COMMUNITY)
Admission: RE | Admit: 2019-10-28 | Discharge: 2019-10-28 | Disposition: A | Discharge status: Home / Self Care | Payer: Medicaid Other | Source: Ambulatory Visit | Attending: Family | Admitting: Family

## 2019-10-28 ENCOUNTER — Other Ambulatory Visit: Payer: Self-pay

## 2019-10-28 ENCOUNTER — Ambulatory Visit (INDEPENDENT_AMBULATORY_CARE_PROVIDER_SITE_OTHER)
Admission: RE | Admit: 2019-10-28 | Discharge: 2019-10-28 | Disposition: A | Discharge status: Home / Self Care | Payer: Medicaid Other | Source: Ambulatory Visit | Attending: Family | Admitting: Family

## 2019-10-28 ENCOUNTER — Ambulatory Visit (INDEPENDENT_AMBULATORY_CARE_PROVIDER_SITE_OTHER): Payer: Medicaid Other | Admitting: Family

## 2019-10-28 ENCOUNTER — Encounter: Payer: Self-pay | Admitting: Family

## 2019-10-28 VITALS — BP 120/73 | HR 70 | Temp 97.8°F | Resp 18 | Ht 70.0 in | Wt 297.0 lb

## 2019-10-28 DIAGNOSIS — E1151 Type 2 diabetes mellitus with diabetic peripheral angiopathy without gangrene: Secondary | ICD-10-CM

## 2019-10-28 DIAGNOSIS — Z87891 Personal history of nicotine dependence: Secondary | ICD-10-CM | POA: Diagnosis not present

## 2019-10-28 DIAGNOSIS — Z89421 Acquired absence of other right toe(s): Secondary | ICD-10-CM | POA: Diagnosis not present

## 2019-10-28 DIAGNOSIS — I8393 Asymptomatic varicose veins of bilateral lower extremities: Secondary | ICD-10-CM | POA: Diagnosis not present

## 2019-10-28 DIAGNOSIS — Z959 Presence of cardiac and vascular implant and graft, unspecified: Secondary | ICD-10-CM | POA: Diagnosis not present

## 2019-10-28 DIAGNOSIS — I779 Disorder of arteries and arterioles, unspecified: Secondary | ICD-10-CM

## 2019-10-28 NOTE — Patient Instructions (Addendum)
Peripheral Vascular Disease  Peripheral vascular disease (PVD) is a disease of the blood vessels that are not part of your heart and brain. A simple term for PVD is poor circulation. In most cases, PVD narrows the blood vessels that carry blood from your heart to the rest of your body. This can reduce the supply of blood to your arms, legs, and internal organs, like your stomach or kidneys. However, PVD most often affects a person's lower legs and feet. Without treatment, PVD tends to get worse. PVD can also lead to acute ischemic limb. This is when an arm or leg suddenly cannot get enough blood. This is a medical emergency. Follow these instructions at home: Lifestyle  Do not use any products that contain nicotine or tobacco, such as cigarettes and e-cigarettes. If you need help quitting, ask your doctor.  Lose weight if you are overweight. Or, stay at a healthy weight as told by your doctor.  Eat a diet that is low in fat and cholesterol. If you need help, ask your doctor.  Exercise regularly. Ask your doctor for activities that are right for you. General instructions  Take over-the-counter and prescription medicines only as told by your doctor.  Take good care of your feet: ? Wear comfortable shoes that fit well. ? Check your feet often for any cuts or sores.  Keep all follow-up visits as told by your doctor This is important. Contact a doctor if:  You have cramps in your legs when you walk.  You have leg pain when you are at rest.  You have coldness in a leg or foot.  Your skin changes.  You are unable to get or have an erection (erectile dysfunction).  You have cuts or sores on your feet that do not heal. Get help right away if:  Your arm or leg turns cold, numb, and blue.  Your arms or legs become red, warm, swollen, painful, or numb.  You have chest pain.  You have trouble breathing.  You suddenly have weakness in your face, arm, or leg.  You become very  confused or you cannot speak.  You suddenly have a very bad headache.  You suddenly cannot see. Summary  Peripheral vascular disease (PVD) is a disease of the blood vessels.  A simple term for PVD is poor circulation. Without treatment, PVD tends to get worse.  Treatment may include exercise, low fat and low cholesterol diet, and quitting smoking. This information is not intended to replace advice given to you by your health care provider. Make sure you discuss any questions you have with your health care provider. Document Released: 03/11/2010 Document Revised: 11/27/2017 Document Reviewed: 01/22/2017 Elsevier Patient Education  2020 [place].     Varicose Veins Varicose veins are veins that have become enlarged, bulged, and twisted. They most often appear in the legs. What are the causes? This condition is caused by damage to the valves in the vein. These valves help blood return to your heart. When they are damaged and they stop working properly, blood may flow backward and back up in the veins near the skin, causing the veins to get larger and appear twisted. The condition can result from any issue that causes blood to back up, like pregnancy, prolonged standing, or obesity. What increases the risk? This condition is more likely to develop in people who are:  On their feet a lot.  Pregnant.  Overweight. What are the signs or symptoms? Symptoms of this condition include:  Bulging, twisted, and bluish veins.  A feeling of heaviness. This may be worse at the end of the day.  Leg pain. This may be worse at the end of the day.  Swelling in the leg.  Changes in skin color over the veins. How is this diagnosed? This condition may be diagnosed based on your symptoms, a physical exam, and an ultrasound test. How is this treated? Treatment for this condition may involve:  Avoiding sitting or standing in one position for long periods of time.  Wearing compression  stockings. These stockings help to prevent blood clots and reduce swelling in the legs.  Raising (elevating) the legs when resting.  Losing weight.  Exercising regularly. If you have persistent symptoms or want to improve the way your varicose veins look, you may choose to have a procedure to close the varicose veins off or to remove them. Treatments to close off the veins include:  Sclerotherapy. In this treatment, a solution is injected into a vein to close it off.  Laser treatment. In this treatment, the vein is heated with a laser to close it off.  Radiofrequency vein ablation. In this treatment, an electrical current produced by radio waves is used to close off the vein. Treatments to remove the veins include:  Phlebectomy. In this treatment, the veins are removed through small incisions made over the veins.  Vein ligation and stripping. In this treatment, incisions are made over the veins. The veins are then removed after being tied (ligated) with stitches (sutures). Follow these instructions at home: Activity  Walk as much as possible. Walking increases blood flow. This helps blood return to the heart and takes pressure off your veins. It also increases your cardiovascular strength.  Follow your health care provider's instructions about exercising.  Do not stand or sit in one position for a long period of time.  Do not sit with your legs crossed.  Rest with your legs raised during the day. General instructions   Follow any diet instructions given to you by your health care provider.  Wear compression stockings as directed by your health care provider. Do not wear other kinds of tight clothing around your legs, pelvis, or waist.  Elevate your legs at night to above the level of your heart.  If you get a cut in the skin over the varicose vein and the vein bleeds: ? Lie down with your leg raised. ? Apply firm pressure to the cut with a clean cloth until the bleeding  stops. ? Place a bandage (dressing) on the cut. Contact a health care provider if:  The skin around your varicose veins starts to break down.  You have pain, redness, tenderness, or hard swelling over a vein.  You are uncomfortable because of pain.  You get a cut in the skin over a varicose vein and it will not stop bleeding. Summary  Varicose veins are veins that have become enlarged, bulged, and twisted. They most often appear in the legs.  This condition is caused by damage to the valves in the vein. These valves help blood return to your heart.  Treatment for this condition includes frequent movements, wearing compression stockings, losing weight, and exercising regularly. In some cases, procedures are done to close off or remove the veins.  Treatment for this condition may include wearing compression stockings, elevating the legs, losing weight, and engaging in regular activity. In some cases, procedures are done to close off or remove the veins. This information is not  intended to replace advice given to you by your health care provider. Make sure you discuss any questions you have with your health care provider. Document Released: 09/24/2005 Document Revised: 02/10/2019 Document Reviewed: 01/07/2017 Elsevier Patient Education  2020 [employer].

## 2019-10-28 NOTE — Progress Notes (Signed)
[facility] [place]   CC: Follow up peripheral artery occlusive disease  History of Present Illness Derrick Clark is a 58 y.o. male whois s/p angiogram with rightSFA stenton 2-3-20followed by right 5th toe amputationon 2-4-20by Dr. Donzetta Matters. He has had a wound vac placed to assist with wound healing, which has been discontinued.   He fell October of 2019 and injured his right foot.   He continues to complain of pain daily at the amputation site which is now well healed. He has no claudication sx's with walking.  He also has lumbar spine issues, he was receiving injections in his back for this until he fell.   Dr. Donzetta Matters last evaluated pt on 03-25-19. At that time he expected wound would heal. Pt was to follow-up in 3 months with repeat ABI and right lower extremity duplex.  Pt denies fever and chills.  He has not seen his podiatrist, Dr. Lavona Mound (sp?) ([place]), since his right 5th toe amputation in February 2020.   Diabetic:Yes, states a previous A1C was 7.6, he does not remember the most recent A1C Tobacco [id number] smoker, quit in July 2020, smoked x 35 years  Pt meds include: Statin :Yes Betablocker:No ASA:Yes Other anticoagulants/antiplatelets:Plavix   Past Medical History:  Diagnosis Date  . Abdominal hernia   . Arthritis   . Diabetes mellitus without complication ([place])   . Nonhealing surgical wound    nonviable tissue  . PAD (peripheral artery disease) ([facility])     Social History Social History   Tobacco Use  . Smoking status: Former Smoker    Packs/day: 0.50    Years: 40.00    Pack years: 20.00    Types: Cigarettes    Quit date: 02/09/2019    Years since quitting: 0.7  . Smokeless tobacco: Never Used  Substance Use Topics  . Alcohol use: No    Frequency: Never  . Drug use: No    Family History Family History  Problem Relation Age of Onset  . Diabetes Mother   . Heart failure Mother   . Cancer  Father     Past Surgical History:  Procedure Laterality Date  . ABDOMINAL AORTOGRAM W/LOWER EXTREMITY N/A 01/31/2019   Procedure: ABDOMINAL AORTOGRAM W/LOWER EXTREMITY;  Surgeon: Waynetta Sandy, MD;  Location: [facility];  Service: Cardiovascular;  Laterality: N/A;  . AMPUTATION Right 02/01/2019   Procedure: AMPUTATION RIGHT FIFTH TOE;  Surgeon: Angelia Mould, MD;  Location: [facility];  Service: Vascular;  Laterality: Right;  . AMPUTATION Right 02/10/2019   Procedure: AMPUTATION RAY RIGHT 5TH TOE WITH DEBRIDEMENT WOUND BED;  Surgeon: Waynetta Sandy, MD;  Location: [facility];  Service: Vascular;  Laterality: Right;  . AMPUTATION TOE Right 02/10/2019   5TH   . PERIPHERAL VASCULAR INTERVENTION Right 01/31/2019   Procedure: PERIPHERAL VASCULAR INTERVENTION;  Surgeon: Waynetta Sandy, MD;  Location: [facility];  Service: Cardiovascular;  Laterality: Right;  SFA  . SHOULDER SURGERY Left     Allergies  Allergen Reactions  . Farxiga [Dapagliflozin] Rash  . Jardiance [Empagliflozin] Rash  . Sulfa Antibiotics Rash    Current Outpatient Medications  Medication Sig Dispense Refill  . aspirin 81 MG chewable tablet Chew 81 mg by mouth daily.     . clopidogrel (PLAVIX) 75 MG tablet Take 1 tablet (75 mg total) by mouth daily with breakfast. 30 tablet 2  . cyclobenzaprine (FLEXERIL) 10 MG tablet Take 10 mg by mouth 3 (three) times  daily as needed for muscle spasms.    . Dulaglutide (TRULICITY) [id number] [id number] SOPN Inject 0.75 mg into the skin every Sunday.     . gabapentin (NEURONTIN) 600 MG tablet Take 600 mg by mouth 3 (three) times daily as needed (pain).   0  . glipiZIDE (GLUCOTROL XL) 10 MG 24 hr tablet Take 10 mg by mouth daily.    . metFORMIN (GLUCOPHAGE-XR) 500 MG 24 hr tablet Take 1,000 mg by mouth 2 (two) times daily.    . simvastatin (ZOCOR) 40 MG tablet Take 40 mg by mouth every evening.     . Aspirin-Acetaminophen (GOODYS BODY PAIN PO) Take 1  packet by mouth daily as needed (pain).    . cadexomer iodine (IODOSORB) 0.9 % gel Apply 1 application topically daily as needed for wound care. (Patient not taking: Reported on 10/28/2019) 40 g 2  . cephALEXin (KEFLEX) 500 MG capsule Take 1 capsule (500 mg total) by mouth 4 (four) times daily. (Patient not taking: Reported on 10/28/2019) 56 capsule 0  . collagenase (SANTYL) ointment Apply 1 application topically daily. To right 5th toe amputation site, change dressing daily. Stop wound vac (Patient not taking: Reported on 10/28/2019) 30 g 1  . oxyCODONE-acetaminophen (PERCOCET/ROXICET) 5-325 MG tablet Take 1 tablet by mouth every 6 (six) hours as needed. (Patient not taking: Reported on 10/28/2019) 10 tablet 0   No current facility-administered medications for this visit.     ROS: See HPI for pertinent positives and negatives.   Physical Examination  Vitals:   10/28/19 1028  BP: 120/73  Pulse: 70  Resp: 18  Temp: 97.8 F (36.6 C)  TempSrc: Temporal  SpO2: 97%  Weight: 297 lb (134.7 kg)  Height: 5\' 10"  (1.778 m)   Body mass index is 42.62 kg/m.  General: A&O x 3, WDWN, morbidly obese male in NAD. Gait: normal HEENT: No gross abnormalities. Large neck Pulmonary: Respirations are non labored, CTAB, good air movement in all fields Cardiac: regular rhythm, no detected murmur.         Carotid Bruits Right Left   Negative Negative   Radial pulses are 2+ palpable bilaterally   Adominal aortic pulse is not palpable                         VASCULAR EXAM: Extremities without ischemic changes, without Gangrene; without open wounds.Right 5th toe amputation site is well healed.  Multiple varicosities at lower legs, no edema, +hemosiderin staining at bilateral pretibial areas.                                                                                                           LE Pulses Right Left       FEMORAL   not palpable (mprbidly obese)  not palpable        POPLITEAL   not palpable   not palpable       POSTERIOR TIBIAL  not palpable   not palpable        DORSALIS PEDIS  ANTERIOR TIBIAL 2+ palpable  2+ palpable    Abdomen: soft, NT, no palpable masses. Skin: no rashes, no cellulitis, no ulcers noted. Musculoskeletal: no muscle wasting or atrophy.  Neurologic: A&O X 3; appropriate affect, Sensation is normal; MOTOR FUNCTION:  moving all extremities equally, motor strength 5/5 throughout. Speech is fluent/normal. CN 2-12 intact. Psychiatric: Thought content is normal, mood appropriate for clinical situation.    DATA  Right LE Arterial Duplex (10-28-19): Right Stent(s): +--------------------------+--------+--------+---------+--------+ superficial femoral arteryPSV cm/sStenosisWaveform Comments +--------------------------+--------+--------+---------+--------+ Prox to Stent             135             triphasic         +--------------------------+--------+--------+---------+--------+ Proximal Stent            158             triphasic         +--------------------------+--------+--------+---------+--------+ Mid Stent                 179             biphasic          +--------------------------+--------+--------+---------+--------+ Distal Stent              138             biphasic          +--------------------------+--------+--------+---------+--------+ Distal to Stent           105             biphasic          +--------------------------+--------+--------+---------+--------+ Summary: Right: Patent superficial femoral artery stent with no evidence of stenosis.    Right LE Arterial Duplex (07-22-19): Right Stent(s): +---------------+--------+--------+---------+--------+ SFA PSV cm/sStenosisWaveform Comments +---------------+--------+--------+---------+--------+ Prox to Stent 145  triphasic  +---------------+--------+--------+---------+--------+ Proximal  Stent 226  triphasic  +---------------+--------+--------+---------+--------+ Mid Stent 161  triphasic  +---------------+--------+--------+---------+--------+ Distal Stent 160  triphasic  +---------------+--------+--------+---------+--------+ Distal to [id number]  triphasic  +---------------+--------+--------+---------+--------+  Summary: Right: Patent stent with no evidence of stenosis in the Right SFA artery. No significant change compared to previous study.   ABI Findings (10-28-19): +---------+------------------+-----+---------+--------+ Right    Rt Pressure (mmHg)IndexWaveform Comment  +---------+------------------+-----+---------+--------+ Brachial 141                                      +---------+------------------+-----+---------+--------+ PTA      116               0.82 biphasic          +---------+------------------+-----+---------+--------+ DP       170               1.21 triphasic         +---------+------------------+-----+---------+--------+ Great Toe108               0.77                   +---------+------------------+-----+---------+--------+  +---------+------------------+-----+--------+-------+ Left     Lt Pressure (mmHg)IndexWaveformComment +---------+------------------+-----+--------+-------+ Brachial 136                                    +---------+------------------+-----+--------+-------+ PTA      151               1.07 biphasic        +---------+------------------+-----+--------+-------+  DP       158               1.12 biphasic        +---------+------------------+-----+--------+-------+ Great Toe104               0.74                 +---------+------------------+-----+--------+-------+  +-------+-----------+-----------+------------+------------+ ABI/TBIToday's ABIToday's TBIPrevious  ABIPrevious TBI +-------+-----------+-----------+------------+------------+ Right  1.21       0.77       1.04        0.77         +-------+-----------+-----------+------------+------------+ Left   1.12       0.74       1.03        0.75         +-------+-----------+-----------+------------+------------+  Bilateral ABIs and TBIs appear essentially unchanged compared to prior study on 07/22/2019.   Summary: Right: Resting right ankle-brachial index is within normal range. No evidence of significant right lower extremity arterial disease. The right toe-brachial index is normal.  Left: Resting left ankle-brachial index is within normal range. No evidence of significant left lower extremity arterial disease. The left toe-brachial index is normal.    ABI (Date: 07/22/2019): ABI Findings: +---------+------------------+-----+---------+--------+ Right Rt Pressure (mmHg)IndexWaveform Comment  +---------+------------------+-----+---------+--------+ Brachial 151  triphasic  +---------+------------------+-----+---------+--------+ PTA 153 0.97 triphasic  +---------+------------------+-----+---------+--------+ DP 163 1.04 triphasic  +---------+------------------+-----+---------+--------+ Great Toe121 0.77 Normal   +---------+------------------+-----+---------+--------+  +---------+------------------+-----+---------+-------+ Left Lt Pressure (mmHg)IndexWaveform Comment +---------+------------------+-----+---------+-------+ Brachial 157  triphasic  +---------+------------------+-----+---------+-------+ PTA 161 1.03 triphasic  +---------+------------------+-----+---------+-------+ DP 151 0.96 triphasic   +---------+------------------+-----+---------+-------+ [place] 0.75 Normal   +---------+------------------+-----+---------+-------+  +-------+-----------+-----------+------------+------------+ ABI/TBIToday's ABIToday's TBIPrevious ABIPrevious TBI +-------+-----------+-----------+------------+------------+ Right 1.04 0.77 1.13 0.76  +-------+-----------+-----------+------------+------------+ Left 1.03 0.75 1.08 0.62  +-------+-----------+-----------+------------+------------+  Bilateral ABIs appear essentially unchanged compared to prior study on 02/17/2019. Right TBIs appear essentially unchanged compared to prior study on 02/17/2019. Left ABIs appear increased compared to prior study on 02/17/2019.  Summary: Right: Resting right ankle-brachial index is within normal range. No evidence of significant right lower extremity arterial disease. The right toe-brachial index is normal. RT great toe pressure = 121 mmHg.  Left: Resting left ankle-brachial index is within normal range. No evidence of significant left lower extremity arterial disease. The left toe-brachial index is normal. LT Great toe pressure = 117 mmHg.   ASSESSMENT: Derrick Clark is a 58 y.o. male who is s/p angiogram withrightSFA stenton 2-3-20followed by right 5th toe amputationon 2-4-20by Dr. Donzetta Matters.  His right 5th toe amputation site is well healed. He has pain at the site, has no claudication sx's with walking, there are no signs of ischemia in his feet or legs.  His pedal pulses are palpable.  Duplex of right leg shows no arterial stenosis, bi and triphasic waveforms.  ABI's and TBI's remain normal, bi and triphasic waveforms in the right, biphasic in the left.   Varicose veins of both lower legs with pretibial hemosiderin staining, no ulcers, no edema: his legs were measured today for 20-30 mm  graduated knee high compression hose, he was given his measurements and the information about a discount compression hose outlet in [place].    PLAN:  Based on the patient's vascular studies and examination, pt will return to clinic in 6 months with ABI's and right LE arterial duplex.  I advised him to notify us if he develops concerns re the circulation in his feet or legs.  Gradually increase walking to a total  of at least 30 minutes total daily.    I discussed in depth with the patient the nature of atherosclerosis, and emphasized the importance of maximal medical management including strict control of blood pressure, blood glucose, and lipid levels, obtaining regular exercise, and continued cessation of smoking.  The patient is aware that without maximal medical management the underlying atherosclerotic disease process will progress, limiting the benefit of any interventions.  The patient was given information about PAD including signs, symptoms, treatment, what symptoms should prompt the patient to seek immediate medical care, and risk reduction measures to take.  Clemon Chambers, RN, MSN, FNP-C [facility] Phone: [phone]  Clinic MD: Donzetta Matters  10/28/19 10:46 AM

## 2019-11-02 DIAGNOSIS — Z6841 Body Mass Index (BMI) 40.0 and over, adult: Secondary | ICD-10-CM | POA: Diagnosis not present

## 2019-11-02 DIAGNOSIS — M7541 Impingement syndrome of right shoulder: Secondary | ICD-10-CM | POA: Diagnosis not present

## 2019-11-02 DIAGNOSIS — M25511 Pain in right shoulder: Secondary | ICD-10-CM | POA: Diagnosis not present

## 2019-11-09 DIAGNOSIS — M19071 Primary osteoarthritis, right ankle and foot: Secondary | ICD-10-CM | POA: Diagnosis not present

## 2019-11-22 DIAGNOSIS — M25571 Pain in right ankle and joints of right foot: Secondary | ICD-10-CM | POA: Diagnosis not present

## 2019-11-23 DIAGNOSIS — R1012 Left upper quadrant pain: Secondary | ICD-10-CM | POA: Diagnosis not present

## 2019-12-12 DIAGNOSIS — M19071 Primary osteoarthritis, right ankle and foot: Secondary | ICD-10-CM | POA: Diagnosis not present

## 2020-01-20 DIAGNOSIS — M19071 Primary osteoarthritis, right ankle and foot: Secondary | ICD-10-CM | POA: Diagnosis not present

## 2020-02-09 DIAGNOSIS — M545 Low back pain: Secondary | ICD-10-CM | POA: Diagnosis not present

## 2020-02-09 DIAGNOSIS — M25551 Pain in right hip: Secondary | ICD-10-CM | POA: Diagnosis not present

## 2020-02-09 DIAGNOSIS — M7061 Trochanteric bursitis, right hip: Secondary | ICD-10-CM | POA: Diagnosis not present

## 2020-03-13 DIAGNOSIS — E8881 Metabolic syndrome: Secondary | ICD-10-CM | POA: Diagnosis not present

## 2020-03-13 DIAGNOSIS — N400 Enlarged prostate without lower urinary tract symptoms: Secondary | ICD-10-CM | POA: Diagnosis not present

## 2020-03-13 DIAGNOSIS — E782 Mixed hyperlipidemia: Secondary | ICD-10-CM | POA: Diagnosis not present

## 2020-03-13 DIAGNOSIS — E1142 Type 2 diabetes mellitus with diabetic polyneuropathy: Secondary | ICD-10-CM | POA: Diagnosis not present

## 2020-03-13 DIAGNOSIS — E1165 Type 2 diabetes mellitus with hyperglycemia: Secondary | ICD-10-CM | POA: Diagnosis not present

## 2020-03-15 DIAGNOSIS — E1165 Type 2 diabetes mellitus with hyperglycemia: Secondary | ICD-10-CM | POA: Diagnosis not present

## 2020-03-15 DIAGNOSIS — Z591 Inadequate housing: Secondary | ICD-10-CM | POA: Diagnosis not present

## 2020-03-15 DIAGNOSIS — L219 Seborrheic dermatitis, unspecified: Secondary | ICD-10-CM | POA: Diagnosis not present

## 2020-03-15 DIAGNOSIS — E8881 Metabolic syndrome: Secondary | ICD-10-CM | POA: Diagnosis not present

## 2020-03-15 DIAGNOSIS — Z608 Other problems related to social environment: Secondary | ICD-10-CM | POA: Diagnosis not present

## 2020-03-15 DIAGNOSIS — E1142 Type 2 diabetes mellitus with diabetic polyneuropathy: Secondary | ICD-10-CM | POA: Diagnosis not present

## 2020-03-15 DIAGNOSIS — E782 Mixed hyperlipidemia: Secondary | ICD-10-CM | POA: Diagnosis not present

## 2020-03-15 DIAGNOSIS — G6289 Other specified polyneuropathies: Secondary | ICD-10-CM | POA: Diagnosis not present

## 2020-04-03 DIAGNOSIS — M7541 Impingement syndrome of right shoulder: Secondary | ICD-10-CM | POA: Diagnosis not present

## 2020-04-19 DIAGNOSIS — K760 Fatty (change of) liver, not elsewhere classified: Secondary | ICD-10-CM | POA: Diagnosis not present

## 2020-04-19 DIAGNOSIS — J449 Chronic obstructive pulmonary disease, unspecified: Secondary | ICD-10-CM | POA: Diagnosis not present

## 2020-04-19 DIAGNOSIS — I700 Atherosclerosis of aorta: Secondary | ICD-10-CM | POA: Diagnosis not present

## 2020-05-02 ENCOUNTER — Other Ambulatory Visit: Payer: Self-pay | Admitting: *Deleted

## 2020-05-02 DIAGNOSIS — I779 Disorder of arteries and arterioles, unspecified: Secondary | ICD-10-CM

## 2020-05-02 DIAGNOSIS — Z959 Presence of cardiac and vascular implant and graft, unspecified: Secondary | ICD-10-CM

## 2020-05-03 DIAGNOSIS — Z23 Encounter for immunization: Secondary | ICD-10-CM | POA: Diagnosis not present

## 2020-05-04 ENCOUNTER — Other Ambulatory Visit: Payer: Self-pay

## 2020-05-04 ENCOUNTER — Ambulatory Visit (INDEPENDENT_AMBULATORY_CARE_PROVIDER_SITE_OTHER): Payer: Medicaid Other | Admitting: Physician Assistant

## 2020-05-04 ENCOUNTER — Ambulatory Visit (HOSPITAL_COMMUNITY)
Admission: RE | Admit: 2020-05-04 | Discharge: 2020-05-04 | Disposition: A | Discharge status: Home / Self Care | Payer: Medicaid Other | Source: Ambulatory Visit | Attending: Vascular Surgery | Admitting: Vascular Surgery

## 2020-05-04 ENCOUNTER — Ambulatory Visit (INDEPENDENT_AMBULATORY_CARE_PROVIDER_SITE_OTHER)
Admission: RE | Admit: 2020-05-04 | Discharge: 2020-05-04 | Disposition: A | Discharge status: Home / Self Care | Payer: Medicaid Other | Source: Ambulatory Visit | Attending: Vascular Surgery | Admitting: Vascular Surgery

## 2020-05-04 VITALS — BP 112/69 | HR 68 | Temp 97.5°F | Resp 20 | Ht 70.0 in | Wt 288.3 lb

## 2020-05-04 DIAGNOSIS — Z959 Presence of cardiac and vascular implant and graft, unspecified: Secondary | ICD-10-CM

## 2020-05-04 DIAGNOSIS — I779 Disorder of arteries and arterioles, unspecified: Secondary | ICD-10-CM

## 2020-05-04 NOTE — Progress Notes (Signed)
HISTORY AND PHYSICAL     CC:  follow up. Requesting Provider:  Caryl Bis, MD  HPI: This is a 59 y.o. male who is here today for follow up.  He has hx of right SFA stenting and right 5th toe amputation by Dr. Donzetta Matters.    The pt returns today for follow up.  He states he is still having pain around the right 5th toe amputation site.  He states when he walks, it hurts on the bottom of his foot.  He is also having pain beside the 4th toe.  He says the wound vac did a good job healing up his wound.  He continues his walking program.    He continues to wear his compression socks and he states it has made a difference.    He has started back smoking.  He tried Chantix but it made him sick.  He states he wants to quit.   The pt is on a statin for cholesterol management.    The pt is on an aspirin.    Other AC:  Plavix  The pt is not on meds for hypertension.  The pt does have diabetes. Tobacco hx:  Current (started back smoking)   Past Medical History:  Diagnosis Date  . Abdominal hernia   . Arthritis   . Diabetes mellitus without complication ([place])   . Nonhealing surgical wound    nonviable tissue  . PAD (peripheral artery disease) ([place])     Past Surgical History:  Procedure Laterality Date  . ABDOMINAL AORTOGRAM W/LOWER EXTREMITY N/A 01/31/2019   Procedure: ABDOMINAL AORTOGRAM W/LOWER EXTREMITY;  Surgeon: Waynetta Sandy, MD;  Location: [facility];  Service: Cardiovascular;  Laterality: N/A;  . AMPUTATION Right 02/01/2019   Procedure: AMPUTATION RIGHT FIFTH TOE;  Surgeon: Angelia Mould, MD;  Location: [facility];  Service: Vascular;  Laterality: Right;  . AMPUTATION Right 02/10/2019   Procedure: AMPUTATION RAY RIGHT 5TH TOE WITH DEBRIDEMENT WOUND BED;  Surgeon: Waynetta Sandy, MD;  Location: [facility];  Service: Vascular;  Laterality: Right;  . AMPUTATION TOE Right 02/10/2019   5TH   . PERIPHERAL VASCULAR INTERVENTION Right 01/31/2019   Procedure:  PERIPHERAL VASCULAR INTERVENTION;  Surgeon: Waynetta Sandy, MD;  Location: [facility];  Service: Cardiovascular;  Laterality: Right;  SFA  . SHOULDER SURGERY Left     Allergies  Allergen Reactions  . Farxiga [Dapagliflozin] Rash  . Jardiance [Empagliflozin] Rash  . Sulfa Antibiotics Rash    Current Outpatient Medications  Medication Sig Dispense Refill  . aspirin 81 MG chewable tablet Chew 81 mg by mouth daily.     . Aspirin-Acetaminophen (GOODYS BODY PAIN PO) Take 1 packet by mouth daily as needed (pain).    . cadexomer iodine (IODOSORB) 0.9 % gel Apply 1 application topically daily as needed for wound care. (Patient not taking: Reported on 10/28/2019) 40 g 2  . cephALEXin (KEFLEX) 500 MG capsule Take 1 capsule (500 mg total) by mouth 4 (four) times daily. (Patient not taking: Reported on 10/28/2019) 56 capsule 0  . clopidogrel (PLAVIX) 75 MG tablet Take 1 tablet (75 mg total) by mouth daily with breakfast. 30 tablet 2  . collagenase (SANTYL) ointment Apply 1 application topically daily. To right 5th toe amputation site, change dressing daily. Stop wound vac (Patient not taking: Reported on 10/28/2019) 30 g 1  . cyclobenzaprine (FLEXERIL) 10 MG tablet Take 10 mg by mouth 3 (three) times daily as needed for muscle  spasms.    . Dulaglutide (TRULICITY) 3.24 MW/1.0UV SOPN Inject 0.75 mg into the skin every Sunday.     . gabapentin (NEURONTIN) 600 MG tablet Take 600 mg by mouth 3 (three) times daily as needed (pain).   0  . glipiZIDE (GLUCOTROL XL) 10 MG 24 hr tablet Take 10 mg by mouth daily.    . metFORMIN (GLUCOPHAGE-XR) 500 MG 24 hr tablet Take 1,000 mg by mouth 2 (two) times daily.    Marland Kitchen oxyCODONE-acetaminophen (PERCOCET/ROXICET) 5-325 MG tablet Take 1 tablet by mouth every 6 (six) hours as needed. (Patient not taking: Reported on 10/28/2019) 10 tablet 0  . simvastatin (ZOCOR) 40 MG tablet Take 40 mg by mouth every evening.      No current facility-administered  medications for this visit.    Family History  Problem Relation Age of Onset  . Diabetes Mother   . Heart failure Mother   . Cancer Father     Social History   Socioeconomic History  . Marital status: Legally Separated    Spouse name: Not on file  . Number of children: Not on file  . Years of education: Not on file  . Highest education level: Not on file  Occupational History  . Not on file  Tobacco Use  . Smoking status: Former Smoker    Packs/day: 0.50    Years: 40.00    Pack years: 20.00    Types: Cigarettes    Quit date: 02/09/2019    Years since quitting: 1.2  . Smokeless tobacco: Never Used  Substance and Sexual Activity  . Alcohol use: No  . Drug use: No  . Sexual activity: Not on file  Other Topics Concern  . Not on file  Social History Narrative  . Not on file   Social Determinants of Health   Financial Resource Strain:   . Difficulty of Paying Living Expenses:   Food Insecurity:   . Worried About Charity fundraiser in the Last Year:   . Arboriculturist in the Last Year:   Transportation Needs:   . Film/video editor (Medical):   Marland Kitchen Lack of Transportation (Non-Medical):   Physical Activity:   . Days of Exercise per Week:   . Minutes of Exercise per Session:   Stress:   . Feeling of Stress :   Social Connections:   . Frequency of Communication with Friends and Family:   . Frequency of Social Gatherings with Friends and Family:   . Attends Religious Services:   . Active Member of Clubs or Organizations:   . Attends Archivist Meetings:   Marland Kitchen Marital Status:   Intimate Partner Violence:   . Fear of Current or Ex-Partner:   . Emotionally Abused:   Marland Kitchen Physically Abused:   . Sexually Abused:      REVIEW OF SYSTEMS:   [X]  denotes positive finding, [ ]  denotes negative finding Cardiac  Comments:  Chest pain or chest pressure:    Shortness of breath upon exertion:    Short of breath when lying flat:    Irregular heart rhythm:         Vascular    Pain in calf, thigh, or hip brought on by ambulation:    Pain in feet at night that wakes you up from your sleep:     Blood clot in your veins:    Leg swelling:         Pulmonary    Oxygen at home:  Productive cough:     Wheezing:         Neurologic    Sudden weakness in arms or legs:     Sudden numbness in arms or legs:     Sudden onset of difficulty speaking or slurred speech:    Temporary loss of vision in one eye:     Problems with dizziness:         Gastrointestinal    Blood in stool:     Vomited blood:         Genitourinary    Burning when urinating:     Blood in urine:        Psychiatric    Major depression:         Hematologic    Bleeding problems:    Problems with blood clotting too easily:        Skin    Rashes or ulcers:        Constitutional    Fever or chills:      PHYSICAL EXAMINATION:  Today's Vitals   05/04/20 1021  BP: 112/69  Pulse: 68  Resp: 20  Temp: (!) 97.5 F (36.4 C)  TempSrc: Temporal  SpO2: 96%  Weight: 288 lb 4.8 oz (130.8 kg)  Height: 5\' 10"  (1.778 m)  PainSc: 10-Worst pain ever   Body mass index is 41.37 kg/m.   General:  WDWN in NAD; vital signs documented above Gait: Normal HENT: WNL, normocephalic Pulmonary: normal non-labored breathing , without Rales, rhonchi,  wheezing Cardiac: regular HR, without  Murmurs; without carotid bruits Abdomen: soft, NT, no masses Skin: without rashes Vascular Exam/Pulses:  Right Left  Radial 2+ (normal) 2+ (normal)  DP 2+ (normal) 2+ (normal)  PT Unable to palpate  Unable to palpate    Extremities: without ischemic changes, without Gangrene , without cellulitis; without open wounds; hemosiderin staining BLE Musculoskeletal: no muscle wasting or atrophy  Neurologic: A&O X 3;  No focal weakness or paresthesias are detected Psychiatric:  The pt has Normal affect.   Non-Invasive Vascular Imaging:   ABI's/TBI's on 05/04/2020: Right:  1.06/0.75 - Great toe  pressure: 105 Left:  1.10/0.79 - Great toe pressure: 111  Arterial duplex on 05/04/2020: Stent(s):  +---------------+---++---------++  Prox to Stent 157triphasic  +---------------+---++---------++  Proximal Stent 165triphasic  +---------------+---++---------++  Mid Stent   184triphasic  +---------------+---++---------++  Distal Stent  142triphasic  +---------------+---++---------++  Distal to Stent117triphasic  +---------------+---++---------++   Summary:  Right: Patent right superficial femoral artery stent without evidence of  Stenosis.  Previous ABI's/TBI's on 10/27/2020: +-------+-----------+-----------+------------+------------+  ABI/TBIToday's ABIToday's TBIPrevious ABIPrevious TBI  +-------+-----------+-----------+------------+------------+  Right 1.21    0.77    1.04    0.77      +-------+-----------+-----------+------------+------------+  Left  1.12    0.74    1.03    0.75      +-------+-----------+-----------+------------+------------+    ASSESSMENT/PLAN:: 59 y.o. male here for follow up for right SFA stenting and right 5th toe amputation  -pt with palpable DP pulses bilaterally and triphasic waveforms right SFA stent.  -his right 5th toe amputation site has healed nicely but continues to have pain in that area.  Pt referred to [facility] for orthotics for pain s/p right 5th toe amputation.   -f/u in 6 months with ABI and RLE arterial duplex.  If study remains unchanged at that time, will have him f/u in one year.  -continue plavix/asa -discussed greater than 3 minutes importance of smoking cessation.  Also discussed smoking with diabetes and  increasing risk of arterial disease.  He does want to quit.  Discussed 1-800-QUIT-NOW.  He states that his wife called and he never received his information.    Leontine Locket, [facility] [phone]  Clinic MD:   Donzetta Matters

## 2020-05-07 ENCOUNTER — Other Ambulatory Visit: Payer: Self-pay | Admitting: *Deleted

## 2020-05-07 DIAGNOSIS — I779 Disorder of arteries and arterioles, unspecified: Secondary | ICD-10-CM

## 2020-06-05 DIAGNOSIS — L03116 Cellulitis of left lower limb: Secondary | ICD-10-CM | POA: Diagnosis not present

## 2020-06-05 DIAGNOSIS — E1165 Type 2 diabetes mellitus with hyperglycemia: Secondary | ICD-10-CM | POA: Diagnosis not present

## 2020-06-05 DIAGNOSIS — Z23 Encounter for immunization: Secondary | ICD-10-CM | POA: Diagnosis not present

## 2020-06-08 DIAGNOSIS — E1165 Type 2 diabetes mellitus with hyperglycemia: Secondary | ICD-10-CM | POA: Diagnosis not present

## 2020-06-08 DIAGNOSIS — L03116 Cellulitis of left lower limb: Secondary | ICD-10-CM | POA: Diagnosis not present

## 2020-07-24 DIAGNOSIS — M6208 Separation of muscle (nontraumatic), other site: Secondary | ICD-10-CM | POA: Insufficient documentation

## 2020-07-24 DIAGNOSIS — K429 Umbilical hernia without obstruction or gangrene: Secondary | ICD-10-CM | POA: Insufficient documentation

## 2020-07-27 DIAGNOSIS — R1013 Epigastric pain: Secondary | ICD-10-CM | POA: Diagnosis not present

## 2020-07-30 DIAGNOSIS — M545 Low back pain: Secondary | ICD-10-CM | POA: Diagnosis not present

## 2020-07-30 DIAGNOSIS — M25552 Pain in left hip: Secondary | ICD-10-CM | POA: Diagnosis not present

## 2020-08-09 ENCOUNTER — Emergency Department (HOSPITAL_COMMUNITY): Payer: Medicaid Other

## 2020-08-09 ENCOUNTER — Other Ambulatory Visit: Payer: Self-pay

## 2020-08-09 ENCOUNTER — Encounter (HOSPITAL_COMMUNITY): Payer: Self-pay | Admitting: *Deleted

## 2020-08-09 ENCOUNTER — Emergency Department (HOSPITAL_COMMUNITY)
Admission: EM | Admit: 2020-08-09 | Discharge: 2020-08-09 | Disposition: A | Discharge status: Home / Self Care | Payer: Medicaid Other | Attending: Emergency Medicine | Admitting: Emergency Medicine

## 2020-08-09 DIAGNOSIS — E278 Other specified disorders of adrenal gland: Secondary | ICD-10-CM | POA: Diagnosis not present

## 2020-08-09 DIAGNOSIS — M25552 Pain in left hip: Secondary | ICD-10-CM

## 2020-08-09 DIAGNOSIS — F17210 Nicotine dependence, cigarettes, uncomplicated: Secondary | ICD-10-CM | POA: Insufficient documentation

## 2020-08-09 DIAGNOSIS — Z7982 Long term (current) use of aspirin: Secondary | ICD-10-CM | POA: Insufficient documentation

## 2020-08-09 DIAGNOSIS — Z7984 Long term (current) use of oral hypoglycemic drugs: Secondary | ICD-10-CM | POA: Insufficient documentation

## 2020-08-09 DIAGNOSIS — K8020 Calculus of gallbladder without cholecystitis without obstruction: Secondary | ICD-10-CM | POA: Diagnosis not present

## 2020-08-09 DIAGNOSIS — I700 Atherosclerosis of aorta: Secondary | ICD-10-CM | POA: Diagnosis not present

## 2020-08-09 DIAGNOSIS — E669 Obesity, unspecified: Secondary | ICD-10-CM | POA: Diagnosis not present

## 2020-08-09 DIAGNOSIS — K5730 Diverticulosis of large intestine without perforation or abscess without bleeding: Secondary | ICD-10-CM | POA: Diagnosis not present

## 2020-08-09 DIAGNOSIS — R1012 Left upper quadrant pain: Secondary | ICD-10-CM

## 2020-08-09 DIAGNOSIS — E119 Type 2 diabetes mellitus without complications: Secondary | ICD-10-CM | POA: Diagnosis not present

## 2020-08-09 DIAGNOSIS — R9431 Abnormal electrocardiogram [ECG] [EKG]: Secondary | ICD-10-CM | POA: Diagnosis not present

## 2020-08-09 LAB — CBC WITH DIFFERENTIAL/PLATELET
Abs Immature Granulocytes: 0.04 10*3/uL (ref 0.00–0.07)
Basophils Absolute: 0.1 10*3/uL (ref 0.0–0.1)
Basophils Relative: 0 %
Eosinophils Absolute: 0.2 10*3/uL (ref 0.0–0.5)
Eosinophils Relative: 2 %
HCT: 47.6 % (ref 39.0–52.0)
Hemoglobin: 15.7 g/dL (ref 13.0–17.0)
Immature Granulocytes: 0 %
Lymphocytes Relative: 23 %
Lymphs Abs: 2.7 10*3/uL (ref 0.7–4.0)
MCH: 31.5 pg (ref 26.0–34.0)
MCHC: 33 g/dL (ref 30.0–36.0)
MCV: 95.6 fL (ref 80.0–100.0)
Monocytes Absolute: 0.9 10*3/uL (ref 0.1–1.0)
Monocytes Relative: 7 %
Neutro Abs: 8.2 10*3/uL — ABNORMAL HIGH (ref 1.7–7.7)
Neutrophils Relative %: 68 %
Platelets: 217 10*3/uL (ref 150–400)
RBC: 4.98 MIL/uL (ref 4.22–5.81)
RDW: 12.6 % (ref 11.5–15.5)
WBC: 12.1 10*3/uL — ABNORMAL HIGH (ref 4.0–10.5)
nRBC: 0 % (ref 0.0–0.2)

## 2020-08-09 LAB — URINALYSIS, ROUTINE W REFLEX MICROSCOPIC
Bacteria, UA: NONE SEEN
Bilirubin Urine: NEGATIVE
Glucose, UA: >=500 mg/dL — AB
Hgb urine dipstick: NEGATIVE
Ketones, ur: NEGATIVE mg/dL
Leukocytes,Ua: NEGATIVE
Nitrite: NEGATIVE
Protein, ur: NEGATIVE mg/dL
Specific Gravity, Urine: 1.029 (ref 1.005–1.030)
pH: 7 (ref 5.0–8.0)

## 2020-08-09 LAB — BASIC METABOLIC PANEL
Anion gap: 9 (ref 5–15)
BUN: 12 mg/dL (ref 6–20)
CO2: 28 mmol/L (ref 22–32)
Calcium: 9.4 mg/dL (ref 8.9–10.3)
Chloride: 101 mmol/L (ref 98–111)
Creatinine, Ser: 0.65 mg/dL (ref 0.61–1.24)
GFR calc Af Amer: >60 mL/min (ref >60)
GFR calc non Af Amer: >60 mL/min (ref >60)
Glucose, Bld: 122 mg/dL — ABNORMAL HIGH (ref 70–99)
Potassium: 4.5 mmol/L (ref 3.5–5.1)
Sodium: 138 mmol/L (ref 135–145)

## 2020-08-09 LAB — HEPATIC FUNCTION PANEL
ALT: 23 U/L (ref 0–44)
AST: 22 U/L (ref 15–41)
Albumin: 4 g/dL (ref 3.5–5.0)
Alkaline Phosphatase: 51 U/L (ref 38–126)
Bilirubin, Direct: 0.1 mg/dL (ref 0.0–0.2)
Indirect Bilirubin: 0.4 mg/dL (ref 0.3–0.9)
Total Bilirubin: 0.5 mg/dL (ref 0.3–1.2)
Total Protein: 7.4 g/dL (ref 6.5–8.1)

## 2020-08-09 LAB — LIPASE, BLOOD: Lipase: 35 U/L (ref 11–51)

## 2020-08-09 MED ORDER — ONDANSETRON HCL 4 MG PO TABS: 4.0000 mg | ORAL_TABLET | Freq: Four times a day (QID) | ORAL | 0 refills | Status: DC

## 2020-08-09 MED ORDER — ONDANSETRON 8 MG PO TBDP
8.0000 mg | ORAL_TABLET | Freq: Once | ORAL | Status: AC
  Administered 2020-08-09: 8 mg via ORAL
  Filled 2020-08-09: qty 1

## 2020-08-09 MED ORDER — KETOROLAC TROMETHAMINE 60 MG/2ML IM SOLN
60.0000 mg | Freq: Once | INTRAMUSCULAR | Status: DC
  Filled 2020-08-09: qty 2

## 2020-08-09 MED ORDER — IOHEXOL 300 MG/ML  SOLN
100.0000 mL | Freq: Once | INTRAMUSCULAR | Status: AC | PRN
  Administered 2020-08-09: 100 mL via INTRAVENOUS

## 2020-08-09 MED ORDER — KETOROLAC TROMETHAMINE 30 MG/ML IJ SOLN
30.0000 mg | Freq: Once | INTRAMUSCULAR | Status: AC
  Administered 2020-08-09: 30 mg via INTRAVENOUS
  Filled 2020-08-09: qty 1

## 2020-08-09 NOTE — ED Provider Notes (Signed)
[facility] EMERGENCY DEPARTMENT Provider Note   CSN: [id number] Arrival date & time: 08/09/20  1404     History Chief Complaint  Patient presents with  . Flank Pain    Derrick Clark is a 59 y.o. male.  HPI 59 year old male with a history of DM type II, PAD, arthritis, abdominal hernia presents to the ER with left upper quadrant pain x2 days.  He also reports pain to his left hip, at the area of the ASIS.  Has been having difficulty walking.  He does endorse some nausea and vomiting.  Pain waxes and wanes, he has not noticed a pattern to this.  No aggravating relieving factors.  Has had several episodes of nonbloody nonbilious vomiting.  No back pain.  No dysuria, no noticeable blood in his stool or urine.  No history of kidney stones.  No fevers or chills.  No history of pancreatitis, denies any alcohol or drug use. No groin numbness, loss of bowel and bladder control. Endorses daily smoking.  Last bowel movement was yesterday and normal.  States he has been doing some lifting of boxes, but nothing very heavy.  Does not think physical activity exacerbated this.    Past Medical History:  Diagnosis Date  . Abdominal hernia   . Arthritis   . Diabetes mellitus without complication ([place])   . Nonhealing surgical wound    nonviable tissue  . PAD (peripheral artery disease) [facility])     Patient Active Problem List   Diagnosis Date Noted  . PAD (peripheral artery disease) ([place]) 02/10/2019  . Peripheral artery disease ([place]) 02/01/2019  . Peripheral arterial disease ([place]) 01/31/2019    Past Surgical History:  Procedure Laterality Date  . ABDOMINAL AORTOGRAM W/LOWER EXTREMITY N/A 01/31/2019   Procedure: ABDOMINAL AORTOGRAM W/LOWER EXTREMITY;  Surgeon: Waynetta Sandy, MD;  Location: [place] CV LAB;  Service: Cardiovascular;  Laterality: N/A;  . AMPUTATION Right 02/01/2019   Procedure: AMPUTATION RIGHT FIFTH TOE;  Surgeon: Angelia Mould, MD;  Location: [place];  Service:  Vascular;  Laterality: Right;  . AMPUTATION Right 02/10/2019   Procedure: AMPUTATION RAY RIGHT 5TH TOE WITH DEBRIDEMENT WOUND BED;  Surgeon: Waynetta Sandy, MD;  Location: [place];  Service: Vascular;  Laterality: Right;  . AMPUTATION TOE Right 02/10/2019   5TH   . PERIPHERAL VASCULAR INTERVENTION Right 01/31/2019   Procedure: PERIPHERAL VASCULAR INTERVENTION;  Surgeon: Waynetta Sandy, MD;  Location: [place] CV LAB;  Service: Cardiovascular;  Laterality: Right;  SFA  . SHOULDER SURGERY Left        Family History  Problem Relation Age of Onset  . Diabetes Mother   . Heart failure Mother   . Cancer Father     Social History   Tobacco Use  . Smoking status: Current Some Day Smoker    Years: 40.00    Types: Cigarettes    Last attempt to quit: 02/09/2019    Years since quitting: 1.4  . Smokeless tobacco: Never Used  . Tobacco comment: 2 packs per week  Vaping Use  . Vaping Use: Never used  Substance Use Topics  . Alcohol use: No  . Drug use: No    Home Medications Prior to Admission medications   Medication Sig Start Date End Date Taking? Authorizing Provider  aspirin 81 MG chewable tablet Chew 81 mg by mouth daily.     [provider]  Aspirin-Acetaminophen (GOODYS BODY PAIN PO) Take 1 packet by mouth daily as needed (pain).  [provider]  clopidogrel (PLAVIX) 75 MG tablet Take 1 tablet (75 mg total) by mouth daily with breakfast. 02/02/19   Ulyses Amor, PA-C  cyclobenzaprine (FLEXERIL) 10 MG tablet Take 10 mg by mouth 3 (three) times daily as needed for muscle spasms. 10/16/18   [provider]  Dapagliflozin Propanediol (FARXIGA PO) Take by mouth daily.    [provider]  Dulaglutide (TRULICITY) 4.94 WH/6.7RF SOPN Inject 0.75 mg into the skin every Sunday.     [provider]  Exenatide ER (BYDUREON) 2 MG PEN INJECT 0.65 ML SUBCUTANEOUSLY ONCE A WEEK 03/17/20   [provider]  gabapentin  (NEURONTIN) 600 MG tablet Take 600 mg by mouth 3 (three) times daily as needed (pain).  04/17/17   [provider]  glipiZIDE (GLUCOTROL XL) 10 MG 24 hr tablet Take 10 mg by mouth daily.    [provider]  metFORMIN (GLUCOPHAGE-XR) 500 MG 24 hr tablet Take 1,000 mg by mouth 2 (two) times daily. 01/11/19   [provider]  ondansetron (ZOFRAN) 4 MG tablet Take 1 tablet (4 mg total) by mouth every 6 (six) hours. 08/09/20   Garald Balding, PA-C  simvastatin (ZOCOR) 40 MG tablet Take 40 mg by mouth every evening.     [provider]    Allergies    Jardiance [empagliflozin] and Sulfa antibiotics  Review of Systems   Review of Systems  Constitutional: Negative for chills and fever.  HENT: Negative for ear pain and sore throat.   Eyes: Negative for pain and visual disturbance.  Respiratory: Negative for cough and shortness of breath.   Cardiovascular: Negative for chest pain and palpitations.  Gastrointestinal: Positive for abdominal pain, nausea and vomiting. Negative for constipation and diarrhea.  Genitourinary: Negative for dysuria and hematuria.  Musculoskeletal: Positive for gait problem. Negative for arthralgias and back pain.  Skin: Negative for color change and rash.  Neurological: Negative for seizures, syncope and headaches.  All other systems reviewed and are negative.   Physical Exam Updated Vital Signs BP 136/67 (BP Location: Right Arm)   Pulse 72   Temp 98.4 F (36.9 C) (Oral)   Resp 14   Ht 5\' 10"  (1.778 m)   Wt 127 kg   SpO2 97%   BMI 40.18 kg/m   Physical Exam Vitals and nursing note reviewed.  Constitutional:      General: He is not in acute distress.    Appearance: Normal appearance. He is well-developed. He is obese. He is not ill-appearing, toxic-appearing or diaphoretic.  HENT:     Head: Normocephalic and atraumatic.  Eyes:     Conjunctiva/sclera: Conjunctivae normal.  Cardiovascular:     Rate and Rhythm: Normal rate  and regular rhythm.     Pulses: Normal pulses.     Heart sounds: Normal heart sounds. No murmur heard.   Pulmonary:     Effort: Pulmonary effort is normal. No respiratory distress.     Breath sounds: Normal breath sounds.  Abdominal:     General: Abdomen is flat.     Palpations: Abdomen is soft.     Tenderness: There is abdominal tenderness. There is no right CVA tenderness or left CVA tenderness.     Comments: Left upper quadrant pain left upper to palpation.  Negative Murphy's, McBurney's.  Musculoskeletal:        General: Tenderness present. No deformity or signs of injury.     Cervical back: Neck supple.     Right lower  leg: No edema.     Left lower leg: No edema.     Comments: Pain to palpation to the left ASIS.  5/5 strength in lower extremities, gross sensations intact.  Able to ambulate without difficulty.2+ radial pulses bilaterally   Skin:    General: Skin is warm and dry.     Findings: No bruising, erythema or rash.  Neurological:     General: No focal deficit present.     Mental Status: He is alert and oriented to person, place, and time.     Sensory: No sensory deficit.     Motor: No weakness.  Psychiatric:        Mood and Affect: Mood normal.        Behavior: Behavior normal.     ED Results / Procedures / Treatments   Labs (all labs ordered are listed, but only abnormal results are displayed) Labs Reviewed  CBC WITH DIFFERENTIAL/PLATELET - Abnormal; Notable for the following components:      Result Value   WBC 12.1 (*)    Neutro Abs 8.2 (*)    All other components within normal limits  BASIC METABOLIC PANEL - Abnormal; Notable for the following components:   Glucose, Bld 122 (*)    All other components within normal limits  URINALYSIS, ROUTINE W REFLEX MICROSCOPIC - Abnormal; Notable for the following components:   Glucose, UA >=500 (*)    All other components within normal limits  LIPASE, BLOOD  HEPATIC FUNCTION PANEL    EKG None  Radiology DG  Chest 2 View  Result Date: 08/09/2020 CLINICAL DATA:  Left upper quadrant pain, smoker EXAM: CHEST - 2 VIEW COMPARISON:  Lung cancer screening CT 04/18/2020 FINDINGS: Hyperinflation and flattening of the diaphragms suggestive of emphysematous changes better seen on cross-sectional imaging. Some mild central vascular congestion is noted without features of frank edema. Findings on a background of some coarsened interstitial and bronchitic changes as well. No focal consolidative process. Likely bone island in the left humerus. Few punctate radiodensities about the left coracoclavicular interval are nonspecific. Degenerative changes are present in the imaged spine and shoulders. IMPRESSION: 1. Central vascular congestion without frank edema. 2. Background of emphysematous and bronchitic features suggestive of underlying COPD. Electronically Signed   By: Lovena Le M.D.   On: 08/09/2020 15:29   CT ABDOMEN PELVIS W CONTRAST  Result Date: 08/09/2020 CLINICAL DATA:  Left upper quadrant and hip pain EXAM: CT ABDOMEN AND PELVIS WITH CONTRAST TECHNIQUE: Multidetector CT imaging of the abdomen and pelvis was performed using the standard protocol following bolus administration of intravenous contrast. CONTRAST:  181mL OMNIPAQUE IOHEXOL 300 MG/ML  SOLN COMPARISON:  CT abdomen and pelvis without and with contrast 06/29/2018, MR lumbar spine 06/03/2018 FINDINGS: Lower chest: Lung bases are clear. Normal heart size. No pericardial effusion. Coronary artery calcifications are present. Punctate focus of gas in the right ventricle, likely related to intravenous access. Hepatobiliary: Diffuse hepatic hypoattenuation compatible with hepatic steatosis. Focal sparing along the gallbladder fossa. Smooth liver surface contour. No focal concerning liver lesions. Radiodense partially calcified gallstone layering dependently within the gallbladder neck. No pericholecystic fluid or inflammation. No biliary ductal dilatation or visible  intraductal gallstones. Pancreas: Unremarkable. No pancreatic ductal dilatation or surrounding inflammatory changes. Spleen: Normal in size. No concerning splenic lesions. Adrenals/Urinary Tract: There is a heterogeneous left adrenal mass containing punctate calcifications and foci of macroscopic fat compatible with an adrenal myelolipoma as characterized on prior CT imaging 06/29/2018 although direct imaging comparison is unavailable  at this time. No right adrenal lesions. Kidneys are normally located. Mild bilateral perinephric stranding, nonspecific though asymmetrically increased on the left which with a slightly delayed left nephrogram. No obstructive urolithiasis or hydronephrosis. Few subcentimeter hypertension foci in the kidneys too small to fully characterize on CT imaging but statistically likely benign. No concerning renal mass. Mild bladder wall thickening and perivesicular haze is noted as well. Stomach/Bowel: Distal esophagus, stomach and duodenal sweep are unremarkable. No small bowel wall thickening or dilatation. No evidence of obstruction. A normal appendix is visualized. No colonic dilatation or wall thickening. Scattered colonic diverticula without focal inflammation to suggest diverticulitis. Vascular/Lymphatic: Atherosclerotic calcifications within the abdominal aorta and branch vessels. No aneurysm or ectasia. Retroaortic left renal vein is noted. No enlarged abdominopelvic lymph nodes. Reproductive: The prostate and seminal vesicles are unremarkable. Other: No abdominopelvic free fluid or free gas. No bowel containing hernias. Musculoskeletal: No acute osseous abnormality or suspicious osseous lesion. Multilevel degenerative changes are present in the imaged portions of the spine. Additional degenerative changes in the SI joints and bilateral hips. IMPRESSION: 1. Mild bilateral perinephric stranding, nonspecific though asymmetrically increased on the left which with a slightly delayed left  nephrogram with some circumferential bladder wall thickening. Findings could reflect ascending urinary tract infection such as a cystitis with left pyelonephritis. Correlate with urinalysis. 2. Macroscopic fat within a 4 cm left adrenal mass is highly suggestive of a benign adrenal myelolipoma in the absence of known malignancy or aggressive invasive features. 3. Hepatic steatosis. 4. Cholelithiasis without evidence of acute cholecystitis. 5. Colonic diverticulosis without evidence of diverticulitis. 6. Aortic Atherosclerosis (ICD10-I70.0). Electronically Signed   By: Lovena Le M.D.   On: 08/09/2020 17:21    Procedures Procedures (including critical care time)  Medications Ordered in ED Medications  ondansetron (ZOFRAN-ODT) disintegrating tablet 8 mg (8 mg Oral Given 08/09/20 1646)  ketorolac (TORADOL) 30 MG/ML injection 30 mg (30 mg Intravenous Given 08/09/20 1646)  iohexol (OMNIPAQUE) 300 MG/ML solution 100 mL (100 mLs Intravenous Contrast Given 08/09/20 1654)    ED Course  I have reviewed the triage vital signs and the nursing notes.  Pertinent labs & imaging results that were available during my care of the patient were reviewed by me and considered in my medical decision making (see chart for details).    MDM Rules/Calculators/A&P                         59 year old male with left upper quadrant pain x2 days, with associated nausea, vomiting.  Also complaining of left hip pain. On presentation, he is alert, oriented, nontoxic-appearing, appears to be mildly uncomfortable, but in no acute distress.  Vitals overall reassuring.  Physical exam with pain to palpation to the left upper quadrant.  Abdomen is soft.  Patient denies any chest pain or shortness of breath.  He also has some pain to the left ASIS.  Visibly ambulated in front of me without changing gait.  Full strength in lower extremities.  No visible bruising, rashes.  DDX includes: pancreatitis, gallbladder disease, liver disease,  hiatal/ventral hernia, dissection  LABS:  CBC with mild leukocytosis of 12.1.  Normal hemoglobin.  Normal platelets. BMP without any electrolyte abnormalities, slightly elevated glucose of 122.  Normal renal function.  Hepatic function panel without AST elevation.  Bilirubin. UA without evidence of UTI, no blood, ketones.  He does have significant glycosuria more than 500 consistent with diabetes diagnosis. Lipase normal.  EKG: NS rhythm with  nonspecific ST abnormalities, no signs of ST elevations of T wave inversions in reciprocal leads  IMAGING:  DG chest with central vascular congestion without frank edema, along with emphysematous and bronchitis suggestive of underlying of COPD CT ABDOMEN PELVIS with degenerative changes of the L-spine and bilateral hip joints.  Also noted bilateral perinephric stranding, with circumferential wall bladder thickening may be suggestive of UTI.  However his urine is clean.  Macroscopic fat with a 4 cm left adrenal mass also noted, suggestive of benign adrenal myelolipoma.  Patient denies any urinary symptoms at this time.  INTERVENTIONS:  Pt treated with Toradol, Zofran, notes significant improvement in symptoms   MDM: Suspicion for dissection low at this time, patient does not endorse any chest pain, back pain, no known AAA.  Overall well-appearing and vitals reassuring.  Lipase normal, not suggestive of pancreatitis.  CT confirms no gallbladder or liver disease.  No evidence of a hernia.  Patient was treated with Toradol and Zofran, reports significant improvement in his pain.  Suspect that his left hip pain is secondary to the degenerative changes seen on CT scan.  Suspicion for septic joint low.  Chest x-ray consistent with COPD and central vascular congestion, however patient states that he has no shortness of breath at this time.  DISPOSITION:   Patient has been medically screened, with no noted life threatening conditions and at this time and no indication  for hospitalization presently.  Stable for discharge with close follow-up with urology, referral provided.  I also encouraged him to follow-up with his PCP about his hip pain, will likely benefit from physical therapy.  Very strict return precautions discussed.  Will send home with Zofran, patient encouraged to take over-the-counter Tylenol or ibuprofen for pain.  Patient voices understanding is agreeable.   Case discussed w/ Dr. Alvino Chapel who is agreeable to the above plan and disposition.   Final Clinical Impression(s) / ED Diagnoses Final diagnoses:  Left upper quadrant pain  Left hip pain    Rx / DC Orders ED Discharge Orders         Ordered    ondansetron (ZOFRAN) 4 MG tablet  Every 6 hours     Discontinue  Reprint     08/09/20 Kendall, Sharmayne Jablon A, PA-C 08/09/20 1739    Davonna Belling, MD 08/09/20 [phone]

## 2020-08-09 NOTE — ED Notes (Signed)
Patient transported to CT 

## 2020-08-09 NOTE — ED Triage Notes (Signed)
Pt with left flank pain for past 2 days along with left hip pain as well.  Denies any injury to area. Denies blood in urine or burning with urination.

## 2020-08-09 NOTE — Discharge Instructions (Addendum)
Your work-up today was overall reasurring, however a possible mass on your left kidney was seen on your CT scan.  Please make sure to follow-up with urology for this with Dr. Milford Cage as discussed.  I provided you some nausea medication.  Please take over-the-counter Tylenol/ibuprofen for pain.  Please follow-up with your primary care doctor as well for the symptoms that you are experiencing today.  Return to the ER if your symptoms worsen.

## 2020-08-10 ENCOUNTER — Telehealth: Payer: Self-pay | Admitting: *Deleted

## 2020-08-10 NOTE — Telephone Encounter (Signed)
Attempted to contact pt to complete transition of care assessment. No answer on phone. Unable to leave message.  Lenor Coffin, RN, BSN, [facility] [place] [phone]

## 2020-09-10 DIAGNOSIS — M545 Low back pain: Secondary | ICD-10-CM | POA: Diagnosis not present

## 2020-09-20 ENCOUNTER — Other Ambulatory Visit: Payer: Self-pay

## 2020-09-20 ENCOUNTER — Ambulatory Visit (HOSPITAL_COMMUNITY): Payer: Medicaid Other | Attending: Family Medicine | Admitting: Physical Therapy

## 2020-09-20 ENCOUNTER — Encounter (HOSPITAL_COMMUNITY): Payer: Self-pay | Admitting: Physical Therapy

## 2020-09-20 DIAGNOSIS — R293 Abnormal posture: Secondary | ICD-10-CM | POA: Insufficient documentation

## 2020-09-20 DIAGNOSIS — M545 Low back pain, unspecified: Secondary | ICD-10-CM

## 2020-09-20 NOTE — Addendum Note (Signed)
Addended by: Josue Hector A on: 09/20/2020 06:04 PM   Modules accepted: Orders

## 2020-09-20 NOTE — Therapy (Signed)
[place], [place], [address] Phone: [phone]   Fax:  [phone]  Physical Therapy Evaluation  Patient Details  Name: Derrick Clark MRN: [id number] Date of Birth: 11/13/1961 Referring Provider (PT): Judd Lien MD   Encounter Date: 09/20/2020   PT End of Session - 09/20/20 1706    Visit Number 1    Number of Visits 8    Date for PT Re-Evaluation 10/19/20    Authorization Type [place] Medicaid Healthy Blue    Authorization Time Period Submitted check auth    Authorization - Visit Number 1    Authorization - Number of Visits 1    PT Start Time 1630    PT Stop Time 1710    PT Time Calculation (min) 40 min    Activity Tolerance Patient tolerated treatment well    Behavior During Therapy [facility] for tasks assessed/performed           Past Medical History:  Diagnosis Date  . Abdominal hernia   . Arthritis   . Diabetes mellitus without complication ([place])   . Nonhealing surgical wound    nonviable tissue  . PAD (peripheral artery disease) ([place])     Past Surgical History:  Procedure Laterality Date  . ABDOMINAL AORTOGRAM W/LOWER EXTREMITY N/A 01/31/2019   Procedure: ABDOMINAL AORTOGRAM W/LOWER EXTREMITY;  Surgeon: Waynetta Sandy, MD;  Location: [place] [facility];  Service: Cardiovascular;  Laterality: N/A;  . AMPUTATION Right 02/01/2019   Procedure: AMPUTATION RIGHT FIFTH TOE;  Surgeon: Angelia Mould, MD;  Location: [place];  Service: Vascular;  Laterality: Right;  . AMPUTATION Right 02/10/2019   Procedure: AMPUTATION RAY RIGHT 5TH TOE WITH DEBRIDEMENT WOUND BED;  Surgeon: Waynetta Sandy, MD;  Location: [place];  Service: Vascular;  Laterality: Right;  . AMPUTATION TOE Right 02/10/2019   5TH   . PERIPHERAL VASCULAR INTERVENTION Right 01/31/2019   Procedure: PERIPHERAL VASCULAR INTERVENTION;  Surgeon: Waynetta Sandy, MD;  Location: [place] [facility];  Service: Cardiovascular;   Laterality: Right;  SFA  . SHOULDER SURGERY Left     There were no vitals filed for this visit.    Subjective Assessment - 09/20/20 1641    Subjective Patient presents to physical therapy with complaint of low back pain for several years. Patient says he was a [place] so he has had this for a while. Says that pain has gotten worse over past 6 months or so. Patient has had injection before which he says was helpful. Denies taking medication currently for this issue. Denies prior therapy for lumbar.    Pertinent History PVD, DM II    Limitations Lifting;House hold activities;Sitting;Standing    Patient Stated Goals Be more flexible without pain    Currently in Pain? Yes    Pain Score 5     Pain Location Back    Pain Orientation Lower;Posterior    Pain Descriptors / Indicators Sharp    Pain Type Chronic pain    Pain Onset More than a month ago    Pain Frequency Constant    Aggravating Factors  twisting, turning, lifting, bending    Pain Relieving Factors laying down    Effect of Pain on Daily Activities Limits              [facility] PT Assessment - 09/20/20 0001      Assessment   Medical Diagnosis LBP    Referring Provider (PT) Judd Lien MD  Onset Date/Surgical Date --   >chronic   Next MD Visit 10/20/20    Prior Therapy --   yes for shoulder      Precautions   Precautions None      Restrictions   Weight Bearing Restrictions No      Balance Screen   Has the patient fallen in the past 6 months No      Prior Function   Level of Independence Independent    Vocation On disability      Cognition   Overall Cognitive Status Within Functional Limits for tasks assessed      Posture/Postural Control   Posture/Postural Control Postural limitations    Postural Limitations Decreased lumbar lordosis      ROM / Strength   AROM / PROM / Strength AROM      AROM   AROM Assessment Site Lumbar    Lumbar Flexion 25% limited     Lumbar Extension 90% limited    with pain     Lumbar - Right Side Bend 25% limited     Lumbar - Left Side Bend 50% limited    with pain    Lumbar - Right Rotation WFL    Lumbar - Left Rotation 25% limited    with pain      Flexibility   Soft Tissue Assessment /Muscle Length yes    Hamstrings min restriction on LT     Piriformis Mod restriction on LT       Transfers   Five time sit to stand comments  15.5 sec with no UEs                      Objective measurements completed on examination: See above findings.               PT Education - 09/20/20 1644    Education Details On evaluation findings, POC and HEP    Person(s) Educated Patient    Methods Explanation    Comprehension Verbalized understanding            PT Short Term Goals - 09/20/20 1715      PT SHORT TERM GOAL #1   Title Patient will be independent with initial HEP and self-management strategies to improve functional outcomes    Time 2    Period Weeks    Status New    Target Date 10/05/20             PT Long Term Goals - 09/20/20 1735      PT LONG TERM GOAL #1   Title Patient will be able to perform stand x 5 in < 10 seconds to demonstrate improvement in functional mobility and reduced risk for falls.    Time 4    Period Weeks    Status New    Target Date 10/19/20      PT LONG TERM GOAL #2   Title Patient will report at least 70% overall improvement in subjective complaint to indicate improvement in ability to perform ADLs.    Time 4    Period Weeks    Status New    Target Date 10/19/20      PT LONG TERM GOAL #3   Title Patient will improve lumbar AROM by 25% in all restricted planes for improved ability to perform functional mobility tasks and ADLs.    Time 4    Period Weeks    Status New    Target Date 10/19/20  Plan - 09/20/20 1707    Clinical Impression Statement Patient is a 59 y.o. male who presents to physical therapy with complaint of LBP. Patient demonstrates decreased strength,  ROM restriction, reduced flexibility and postural abnormality which are likely contributing to symptoms of pain and are negatively impacting patient ability to perform ADLs and functional mobility tasks. Patient will benefit from skilled physical therapy services to address these deficits to reduce pain and improve level of function with ADLs and functional mobility tasks.    Examination-Activity Limitations Stairs;Squat    Examination-Participation Restrictions Yard Work;Cleaning;Community Activity    Stability/Clinical Decision Making Stable/Uncomplicated    Clinical Decision Making Low    Rehab Potential Good    PT Frequency 2x / week    PT Duration 4 weeks    PT Treatment/Interventions ADLs/Self Care Home Management;Aquatic Therapy;Biofeedback;Cryotherapy;Parrafin;Fluidtherapy;Therapeutic activities;Functional mobility training;Traction;Ultrasound;Patient/family education;Manual lymph drainage;Manual techniques;Dry needling;Spinal Manipulations;Energy conservation;Splinting;Visual/perceptual remediation/compensation;Passive range of motion;Scar mobilization;Vestibular;Vasopneumatic Device;Taping;Joint Manipulations;Compression bandaging;Orthotic Fit/Training;Balance training;Contrast Bath;DME Instruction;Electrical Stimulation;Iontophoresis 4mg /ml Dexamethasone;Therapeutic exercise;Moist Heat;Gait training;Stair training;Neuromuscular re-education    PT Next Visit Plan Review goals and HEP. Progress lumbar mobility and hip and core strength as tolerated. Begin on mat and progress to standing when able.    PT Home Exercise Plan 09/20/20: SKTC, HS stretch, piriformis stretch    Consulted and Agree with Plan of Care Patient           Patient will benefit from skilled therapeutic intervention in order to improve the following deficits and impairments:  Pain, Improper body mechanics, Increased fascial restricitons, Decreased activity tolerance, Impaired flexibility, Hypomobility, Decreased strength,  Decreased range of motion, Postural dysfunction  Visit Diagnosis: Low back pain, unspecified back pain laterality, unspecified chronicity, unspecified whether sciatica present  Abnormal posture     Problem List Patient Active Problem List   Diagnosis Date Noted  . PAD (peripheral artery disease) ([place]) 02/10/2019  . Peripheral artery disease ([place]) 02/01/2019  . Peripheral arterial disease ([place]) 01/31/2019    6:00 PM, 09/20/20 Josue Hector PT DPT  Physical Therapist with [facility]  [phone] [address] [place], [place], [address] Phone: [phone]   Fax:  [phone]  Name: GAELAN GLENNON MRN: [id number] Date of Birth: [birth year]

## 2020-09-20 NOTE — Patient Instructions (Signed)
Access Code: [email or web address] URL: [email or web address] Date: 09/20/2020 Prepared by: Josue Hector  Exercises Hook Lying Single Knee to Chest Stretch with Towel - 3 x daily - 7 x weekly - 1 sets - 5 reps - 10 sec hold Supine Hamstring Stretch - 3 x daily - 7 x weekly - 1 sets - 10 reps Supine Piriformis Stretch with Foot on Ground - 3 x daily - 7 x weekly - 1 sets - 5 reps - 10 sec hold

## 2020-09-24 ENCOUNTER — Ambulatory Visit (HOSPITAL_COMMUNITY): Payer: Medicaid Other | Admitting: Physical Therapy

## 2020-09-24 ENCOUNTER — Other Ambulatory Visit: Payer: Self-pay

## 2020-09-24 ENCOUNTER — Encounter (HOSPITAL_COMMUNITY): Payer: Self-pay | Admitting: Physical Therapy

## 2020-09-24 DIAGNOSIS — R293 Abnormal posture: Secondary | ICD-10-CM | POA: Diagnosis not present

## 2020-09-24 DIAGNOSIS — M545 Low back pain, unspecified: Secondary | ICD-10-CM

## 2020-09-24 NOTE — Therapy (Signed)
[place], [place], [address] Phone: [phone]   Fax:  [phone]  Physical Therapy Treatment  Patient Details  Name: Derrick Clark MRN: [id number] Date of Birth: [birth year] Referring Provider (PT): Judd Lien MD   Encounter Date: 09/24/2020   PT End of Session - 09/24/20 1421    Visit Number 2    Number of Visits 8    Date for PT Re-Evaluation 10/19/20    Authorization Type [place] Medicaid Healthy Blue    Authorization Time Period Submitted check auth still in review    Authorization - Visit Number --    Authorization - Number of Visits --    PT Start Time 3220    PT Stop Time 1445    PT Time Calculation (min) 40 min    Activity Tolerance Patient tolerated treatment well    Behavior During Therapy [facility] for tasks assessed/performed           Past Medical History:  Diagnosis Date  . Abdominal hernia   . Arthritis   . Diabetes mellitus without complication ([place])   . Nonhealing surgical wound    nonviable tissue  . PAD (peripheral artery disease) ([place])     Past Surgical History:  Procedure Laterality Date  . ABDOMINAL AORTOGRAM W/LOWER EXTREMITY N/A 01/31/2019   Procedure: ABDOMINAL AORTOGRAM W/LOWER EXTREMITY;  Surgeon: Waynetta Sandy, MD;  Location: [place] [facility];  Service: Cardiovascular;  Laterality: N/A;  . AMPUTATION Right 02/01/2019   Procedure: AMPUTATION RIGHT FIFTH TOE;  Surgeon: Angelia Mould, MD;  Location: [place];  Service: Vascular;  Laterality: Right;  . AMPUTATION Right 02/10/2019   Procedure: AMPUTATION RAY RIGHT 5TH TOE WITH DEBRIDEMENT WOUND BED;  Surgeon: Waynetta Sandy, MD;  Location: [place];  Service: Vascular;  Laterality: Right;  . AMPUTATION TOE Right 02/10/2019   5TH   . PERIPHERAL VASCULAR INTERVENTION Right 01/31/2019   Procedure: PERIPHERAL VASCULAR INTERVENTION;  Surgeon: Waynetta Sandy, MD;  Location: [place] [facility];  Service:  Cardiovascular;  Laterality: Right;  SFA  . SHOULDER SURGERY Left     There were no vitals filed for this visit.   Subjective Assessment - 09/24/20 1407    Subjective Pt  states that most the time he hurts on the Lt side of his back radiating into the hip.  Right now the pain is at a 5/10    Pertinent History PVD, DM II    Limitations Lifting;House hold activities;Sitting;Standing    Patient Stated Goals Be more flexible without pain    Pain Score 5     Pain Location Back    Pain Orientation Lower;Left    Pain Descriptors / Indicators Aching    Pain Type Chronic pain    Pain Onset More than a month ago    Pain Frequency Constant    Aggravating Factors  lifting    Pain Relieving Factors laying down                             [facility] Adult PT Treatment/Exercise - 09/24/20 0001      Exercises   Exercises Lumbar      Lumbar Exercises: Stretches   Passive Hamstring Stretch Right;Left;3 reps;30 seconds    Single Knee to Chest Stretch Right;Left;3 reps;30 seconds    Lower Trunk Rotation 5 reps      Lumbar Exercises: Standing   Heel Raises 10  reps      Lumbar Exercises: Seated   Other Seated Lumbar Exercises sit in correct posture 5" x 5     Other Seated Lumbar Exercises thoracic excursion x 3       Lumbar Exercises: Supine   Ab Set 10 reps;5 seconds    Clam 10 reps    Bridge 10 reps                  PT Education - 09/24/20 1420    Education Details HEP    Person(s) Educated Patient    Methods Explanation;Demonstration;Verbal cues;Handout            PT Short Term Goals - 09/24/20 1432      PT SHORT TERM GOAL #1   Title Patient will be independent with initial HEP and self-management strategies to improve functional outcomes    Time 2    Period Weeks    Status On-going    Target Date 10/05/20             PT Long Term Goals - 09/24/20 1432      PT LONG TERM GOAL #1   Title Patient will be able to perform stand x 5 in < 10 seconds  to demonstrate improvement in functional mobility and reduced risk for falls.    Time 4    Period Weeks    Status On-going      PT LONG TERM GOAL #2   Title Patient will report at least 70% overall improvement in subjective complaint to indicate improvement in ability to perform ADLs.    Time 4    Period Weeks    Status On-going      PT LONG TERM GOAL #3   Title Patient will improve lumbar AROM by 25% in all restricted planes for improved ability to perform functional mobility tasks and ADLs.    Time 4    Period Weeks    Status On-going                 Plan - 09/24/20 1426    Clinical Impression Statement Evaluation and goals reviewed with pt.  Therapist started pt on gentle ROM and stabilization exercises per poc.  PT able to demonstrate good form with exercises.    Examination-Activity Limitations Stairs;Squat    Examination-Participation Restrictions Yard Work;Cleaning;Community Activity    Stability/Clinical Decision Making Stable/Uncomplicated    Rehab Potential Good    PT Frequency 2x / week    PT Duration 4 weeks    PT Treatment/Interventions ADLs/Self Care Home Management;Aquatic Therapy;Biofeedback;Cryotherapy;Parrafin;Fluidtherapy;Therapeutic activities;Functional mobility training;Traction;Ultrasound;Patient/family education;Manual lymph drainage;Manual techniques;Dry needling;Spinal Manipulations;Energy conservation;Splinting;Visual/perceptual remediation/compensation;Passive range of motion;Scar mobilization;Vestibular;Vasopneumatic Device;Taping;Joint Manipulations;Compression bandaging;Orthotic Fit/Training;Balance training;Contrast Bath;DME Instruction;Electrical Stimulation;Iontophoresis 4mg /ml Dexamethasone;Therapeutic exercise;Moist Heat;Gait training;Stair training;Neuromuscular re-education    PT Next Visit Plan . Progress lumbar mobility and hip and core strength as tolerated.  hip excursion exercises Begin on mat and progress to standing when able.    PT  Home Exercise Plan 09/20/20: SKTC, HS stretch, piriformis stretch; 9/27:  LTR; ab set , bridge sitting in good posture.    Consulted and Agree with Plan of Care Patient           Patient will benefit from skilled therapeutic intervention in order to improve the following deficits and impairments:  Pain, Improper body mechanics, Increased fascial restricitons, Decreased activity tolerance, Impaired flexibility, Hypomobility, Decreased strength, Decreased range of motion, Postural dysfunction  Visit Diagnosis: Low back pain, unspecified back pain laterality, unspecified chronicity, unspecified whether  sciatica present  Abnormal posture     Problem List Patient Active Problem List   Diagnosis Date Noted  . PAD (peripheral artery disease) ([place]) 02/10/2019  . Peripheral artery disease ([place]) 02/01/2019  . Peripheral arterial disease [facility]) 01/31/2019  Rayetta Humphrey, PT CLT [phone] 09/24/2020, 2:42 PM  [facility] [address] [place], [place], [address] Phone: [phone]   Fax:  [phone]  Name: BUD KAESER MRN: [id number] Date of Birth: February 26, 1961

## 2020-09-28 ENCOUNTER — Telehealth (HOSPITAL_COMMUNITY): Payer: Self-pay

## 2020-09-28 ENCOUNTER — Encounter (HOSPITAL_COMMUNITY): Payer: Self-pay

## 2020-09-28 ENCOUNTER — Ambulatory Visit (HOSPITAL_COMMUNITY): Payer: Medicaid Other

## 2020-09-28 NOTE — Telephone Encounter (Signed)
pt called to cx today's appt lmonvm no reason given

## 2020-10-02 ENCOUNTER — Other Ambulatory Visit: Payer: Self-pay

## 2020-10-02 ENCOUNTER — Ambulatory Visit (HOSPITAL_COMMUNITY): Payer: Medicaid Other | Attending: Family Medicine | Admitting: Physical Therapy

## 2020-10-02 DIAGNOSIS — R293 Abnormal posture: Secondary | ICD-10-CM | POA: Diagnosis not present

## 2020-10-02 DIAGNOSIS — M545 Low back pain, unspecified: Secondary | ICD-10-CM | POA: Diagnosis not present

## 2020-10-02 NOTE — Therapy (Signed)
[facility] [address] [place], [place], [address] Phone: [phone]   Fax:  [phone]  Physical Therapy Treatment  Patient Details  Name: Derrick Clark MRN: [id number] Date of Birth: 1961/04/09 Referring Provider (PT): Judd Lien MD   Encounter Date: 10/02/2020   PT End of Session - 10/02/20 1651    Visit Number 3    Number of Visits 8    Date for PT Re-Evaluation 10/19/20    Authorization Type [place] Medicaid Healthy Blue    Authorization Time Period Submitted check auth    PT Start Time 1618    PT Stop Time 1657    PT Time Calculation (min) 39 min    Activity Tolerance Patient tolerated treatment well    Behavior During Therapy [facility] for tasks assessed/performed           Past Medical History:  Diagnosis Date  . Abdominal hernia   . Arthritis   . Diabetes mellitus without complication ([place])   . Nonhealing surgical wound    nonviable tissue  . PAD (peripheral artery disease) ([place])     Past Surgical History:  Procedure Laterality Date  . ABDOMINAL AORTOGRAM W/LOWER EXTREMITY N/A 01/31/2019   Procedure: ABDOMINAL AORTOGRAM W/LOWER EXTREMITY;  Surgeon: Waynetta Sandy, MD;  Location: [place];  Service: Cardiovascular;  Laterality: N/A;  . AMPUTATION Right 02/01/2019   Procedure: AMPUTATION RIGHT FIFTH TOE;  Surgeon: Angelia Mould, MD;  Location: [place];  Service: Vascular;  Laterality: Right;  . AMPUTATION Right 02/10/2019   Procedure: AMPUTATION RAY RIGHT 5TH TOE WITH DEBRIDEMENT WOUND BED;  Surgeon: Waynetta Sandy, MD;  Location: [place];  Service: Vascular;  Laterality: Right;  . AMPUTATION TOE Right 02/10/2019   5TH   . PERIPHERAL VASCULAR INTERVENTION Right 01/31/2019   Procedure: PERIPHERAL VASCULAR INTERVENTION;  Surgeon: Waynetta Sandy, MD;  Location: [place] [facility];  Service: Cardiovascular;  Laterality: Right;  SFA  . SHOULDER SURGERY Left     There were no vitals  filed for this visit.   Subjective Assessment - 10/02/20 1631    Subjective pt states his back pain comes and goes, however does not radiate any lower than his Lt hip .  Feels like it's already getting a little better.  STates he is currently moving and packing boxes so that is aggrevating it some.    Currently in Pain? Yes    Pain Score 4     Pain Location Back    Pain Orientation Right;Left;Lower    Pain Descriptors / Indicators Aching                             [facility] Adult PT Treatment/Exercise - 10/02/20 0001      Lumbar Exercises: Stretches   Passive Hamstring Stretch Right;Left;3 reps;30 seconds    Single Knee to Chest Stretch Right;Left;3 reps;30 seconds    Lower Trunk Rotation 5 reps    Piriformis Stretch Right;Left;2 reps;30 seconds    Piriformis Stretch Limitations seated each LE      Lumbar Exercises: Standing   Heel Raises 15 reps    Other Standing Lumbar Exercises hip excursions 10 reps each      Lumbar Exercises: Supine   Clam 10 reps    Bridge 10 reps    Straight Leg Raise 10 reps      Lumbar Exercises: Sidelying   Hip Abduction Both;10 reps  Lumbar Exercises: Prone   Other Prone Lumbar Exercises POE 2X1 minute holds    Other Prone Lumbar Exercises pressups 10 reps                    PT Short Term Goals - 09/24/20 1432      PT SHORT TERM GOAL #1   Title Patient will be independent with initial HEP and self-management strategies to improve functional outcomes    Time 2    Period Weeks    Status On-going    Target Date 10/05/20             PT Long Term Goals - 09/24/20 1432      PT LONG TERM GOAL #1   Title Patient will be able to perform stand x 5 in < 10 seconds to demonstrate improvement in functional mobility and reduced risk for falls.    Time 4    Period Weeks    Status On-going      PT LONG TERM GOAL #2   Title Patient will report at least 70% overall improvement in subjective complaint to indicate  improvement in ability to perform ADLs.    Time 4    Period Weeks    Status On-going      PT LONG TERM GOAL #3   Title Patient will improve lumbar AROM by 25% in all restricted planes for improved ability to perform functional mobility tasks and ADLs.    Time 4    Period Weeks    Status On-going                 Plan - 10/02/20 1652    Clinical Impression Statement continued with focus on improving core stab strength and hip mobility.  Added seated piriformis stretch with noted tightness bilaterally.  Progressed with bridge, sidelying hip abduction and straight leg raises.  Most weakness noted with straight leg raise and cues with all for correct form and technique.  Began prone lying and repeated pressups resulting in reduced symptoms, improvement.  Instructed to complete this at home if he is getting relief.  Hip exursions added to help improve mobility with some discomfort with lateral flexion.    Examination-Activity Limitations Stairs;Squat    Examination-Participation Restrictions Yard Work;Cleaning;Community Activity    Stability/Clinical Decision Making Stable/Uncomplicated    Rehab Potential Good    PT Frequency 2x / week    PT Duration 4 weeks    PT Treatment/Interventions ADLs/Self Care Home Management;Aquatic Therapy;Biofeedback;Cryotherapy;Parrafin;Fluidtherapy;Therapeutic activities;Functional mobility training;Traction;Ultrasound;Patient/family education;Manual lymph drainage;Manual techniques;Dry needling;Spinal Manipulations;Energy conservation;Splinting;Visual/perceptual remediation/compensation;Passive range of motion;Scar mobilization;Vestibular;Vasopneumatic Device;Taping;Joint Manipulations;Compression bandaging;Orthotic Fit/Training;Balance training;Contrast Bath;DME Instruction;Electrical Stimulation;Iontophoresis 4mg /ml Dexamethasone;Therapeutic exercise;Moist Heat;Gait training;Stair training;Neuromuscular re-education    PT Next Visit Plan Progress lumbar  mobility and hip and core strength as tolerated.  Progress to standing when able.    PT Home Exercise Plan 09/20/20: SKTC, HS stretch, piriformis stretch; 9/27:  LTR; ab set , bridge sitting in good posture.    Consulted and Agree with Plan of Care Patient           Patient will benefit from skilled therapeutic intervention in order to improve the following deficits and impairments:  Pain, Improper body mechanics, Increased fascial restricitons, Decreased activity tolerance, Impaired flexibility, Hypomobility, Decreased strength, Decreased range of motion, Postural dysfunction  Visit Diagnosis: Abnormal posture  Low back pain, unspecified back pain laterality, unspecified chronicity, unspecified whether sciatica present     Problem List Patient Active Problem List   Diagnosis Date Noted  .  PAD (peripheral artery disease) ([place]) 02/10/2019  . Peripheral artery disease ([place]) 02/01/2019  . Peripheral arterial disease ([place]) 01/31/2019   Teena Irani, PTA/CLT [phone]  Teena Irani 10/02/2020, 4:56 PM  [facility] [address] [place], [place], [address] Phone: [phone]   Fax:  [phone]  Name: ASANTE BLANDA MRN: [id number] Date of Birth: [birth year]

## 2020-10-03 DIAGNOSIS — Z23 Encounter for immunization: Secondary | ICD-10-CM | POA: Diagnosis not present

## 2020-10-05 ENCOUNTER — Encounter (HOSPITAL_COMMUNITY): Payer: Self-pay | Admitting: Physical Therapy

## 2020-10-05 ENCOUNTER — Ambulatory Visit (HOSPITAL_COMMUNITY): Payer: Medicaid Other | Admitting: Physical Therapy

## 2020-10-05 ENCOUNTER — Other Ambulatory Visit: Payer: Self-pay

## 2020-10-05 DIAGNOSIS — M545 Low back pain, unspecified: Secondary | ICD-10-CM

## 2020-10-05 DIAGNOSIS — R293 Abnormal posture: Secondary | ICD-10-CM | POA: Diagnosis not present

## 2020-10-05 NOTE — Therapy (Signed)
[facility] [address] [place], [place], [address] Phone: [phone]   Fax:  [phone]  Physical Therapy Treatment  Patient Details  Name: Derrick Clark MRN: [id number] Date of Birth: [birth year] Referring Provider (PT): Judd Lien MD   Encounter Date: 10/05/2020   PT End of Session - 10/05/20 1531    Visit Number 4    Number of Visits 8    Date for PT Re-Evaluation 10/19/20    Authorization Type [place] Medicaid Healthy Blue    Authorization Time Period Submitted check auth    PT Start Time 1532    PT Stop Time 1610    PT Time Calculation (min) 38 min    Activity Tolerance Patient tolerated treatment well    Behavior During Therapy [facility] for tasks assessed/performed           Past Medical History:  Diagnosis Date   Abdominal hernia    Arthritis    Diabetes mellitus without complication ([place])    Nonhealing surgical wound    nonviable tissue   PAD (peripheral artery disease) ([place])     Past Surgical History:  Procedure Laterality Date   ABDOMINAL AORTOGRAM W/LOWER EXTREMITY N/A 01/31/2019   Procedure: ABDOMINAL AORTOGRAM W/LOWER EXTREMITY;  Surgeon: Waynetta Sandy, MD;  Location: [facility] [facility];  Service: Cardiovascular;  Laterality: N/A;   AMPUTATION Right 02/01/2019   Procedure: AMPUTATION RIGHT FIFTH TOE;  Surgeon: Angelia Mould, MD;  Location: [place];  Service: Vascular;  Laterality: Right;   AMPUTATION Right 02/10/2019   Procedure: AMPUTATION RAY RIGHT 5TH TOE WITH DEBRIDEMENT WOUND BED;  Surgeon: Waynetta Sandy, MD;  Location: [place];  Service: Vascular;  Laterality: Right;   AMPUTATION TOE Right 02/10/2019   5TH    PERIPHERAL VASCULAR INTERVENTION Right 01/31/2019   Procedure: PERIPHERAL VASCULAR INTERVENTION;  Surgeon: Waynetta Sandy, MD;  Location: [facility];  Service: Cardiovascular;  Laterality: Right;  SFA   SHOULDER SURGERY Left     There were no vitals  filed for this visit.   Subjective Assessment - 10/05/20 1538    Subjective States he is having more pain in his right shoulder and right knee and his back is feeling alright. States he is moving and it is challenging to do his exercise regularly.    Pertinent History PVD, DM II    Currently in Pain? No/denies              [facility] PT Assessment - 10/05/20 0001      Assessment   Medical Diagnosis LBP    Referring Provider (PT) Judd Lien MD    Next MD Visit 10/20/20                         [facility] Adult PT Treatment/Exercise - 10/05/20 0001      Lumbar Exercises: Stretches   Single Knee to Chest Stretch Left;Right;5 reps;10 seconds    Double Knee to Chest Stretch --   x15 5" holds on green ball   Lower Trunk Rotation 10 seconds   x10 on ball     Lumbar Exercises: Standing   Other Standing Lumbar Exercises hip extension 3x5 5" holds B     Other Standing Lumbar Exercises lumbar extension x5 5" holds- increase in back pain       Lumbar Exercises: Seated   Other Seated Lumbar Exercises seated tall core set x10 - 10" holds ; hip add  isometric with tall posture - x10 5" holds     Other Seated Lumbar Exercises self traction in chair - pain in right shoulder - stopped.       Lumbar Exercises: Supine   Dead Bug 5 reps;5 seconds   isometric with green ball - cues to breath  2 sets    Bridge 10 reps   2 sets    Other Supine Lumbar Exercises butterfly stretch x15 5" holds B       Lumbar Exercises: Prone   Straight Leg Raise 10 reps;2 seconds   2 sets B    Other Prone Lumbar Exercises hamstring curls x10 B - pain on the right side    Other Prone Lumbar Exercises POE x2 60  seconds each                    PT Short Term Goals - 09/24/20 1432      PT SHORT TERM GOAL #1   Title Patient will be independent with initial HEP and self-management strategies to improve functional outcomes    Time 2    Period Weeks    Status On-going    Target Date 10/05/20               PT Long Term Goals - 09/24/20 1432      PT LONG TERM GOAL #1   Title Patient will be able to perform stand x 5 in < 10 seconds to demonstrate improvement in functional mobility and reduced risk for falls.    Time 4    Period Weeks    Status On-going      PT LONG TERM GOAL #2   Title Patient will report at least 70% overall improvement in subjective complaint to indicate improvement in ability to perform ADLs.    Time 4    Period Weeks    Status On-going      PT LONG TERM GOAL #3   Title Patient will improve lumbar AROM by 25% in all restricted planes for improved ability to perform functional mobility tasks and ADLs.    Time 4    Period Weeks    Status On-going                 Plan - 10/05/20 1531    Clinical Impression Statement Patient limited with exercises secondary to chronic right knee pain. Patient required cues to breath throughout session as he has a tendency to hold his breath with all exercises. Added deadbugs which were very challenging for patient but no increase in symptoms noted during exercise. Trialed standing exercises but back pain started to increase. This resolved after exercise and patient sat down. Fatigue noted end of session and reports of feeling stretched but no pain. Will continue with current POC.    Examination-Activity Limitations Stairs;Squat    Examination-Participation Restrictions Yard Work;Cleaning;Community Activity    Stability/Clinical Decision Making Stable/Uncomplicated    Rehab Potential Good    PT Frequency 2x / week    PT Duration 4 weeks    PT Treatment/Interventions ADLs/Self Care Home Management;Aquatic Therapy;Biofeedback;Cryotherapy;Parrafin;Fluidtherapy;Therapeutic activities;Functional mobility training;Traction;Ultrasound;Patient/family education;Manual lymph drainage;Manual techniques;Dry needling;Spinal Manipulations;Energy conservation;Splinting;Visual/perceptual remediation/compensation;Passive range of  motion;Scar mobilization;Vestibular;Vasopneumatic Device;Taping;Joint Manipulations;Compression bandaging;Orthotic Fit/Training;Balance training;Contrast Bath;DME Instruction;Electrical Stimulation;Iontophoresis 4mg /ml Dexamethasone;Therapeutic exercise;Moist Heat;Gait training;Stair training;Neuromuscular re-education    PT Next Visit Plan Progress lumbar mobility and hip and core strength as tolerated.  Progress to standing when able.    PT Home Exercise Plan 09/20/20: SKTC, HS stretch, piriformis stretch; 9/27:  LTR; ab set , bridge sitting in good posture.    Consulted and Agree with Plan of Care Patient           Patient will benefit from skilled therapeutic intervention in order to improve the following deficits and impairments:  Pain, Improper body mechanics, Increased fascial restricitons, Decreased activity tolerance, Impaired flexibility, Hypomobility, Decreased strength, Decreased range of motion, Postural dysfunction  Visit Diagnosis: Abnormal posture  Low back pain, unspecified back pain laterality, unspecified chronicity, unspecified whether sciatica present     Problem List Patient Active Problem List   Diagnosis Date Noted   PAD (peripheral artery disease) ([place]) 02/10/2019   Peripheral artery disease ([place]) 02/01/2019   Peripheral arterial disease ([place]) 01/31/2019   4:13 PM, 10/05/20 Jerene Pitch, DPT Physical Therapy with [facility]  [phone] office  [facility] [address] [place], [place], [address] Phone: [phone]   Fax:  [phone]  Name: Derrick Clark MRN: [id number] Date of Birth: 30-Jan-1961

## 2020-10-09 ENCOUNTER — Telehealth (HOSPITAL_COMMUNITY): Payer: Self-pay | Admitting: Physical Therapy

## 2020-10-09 ENCOUNTER — Ambulatory Visit (HOSPITAL_COMMUNITY): Payer: Medicaid Other | Admitting: Physical Therapy

## 2020-10-09 NOTE — Telephone Encounter (Signed)
Car trouble can not get here please cx today °

## 2020-10-11 DIAGNOSIS — R946 Abnormal results of thyroid function studies: Secondary | ICD-10-CM | POA: Diagnosis not present

## 2020-10-12 ENCOUNTER — Other Ambulatory Visit: Payer: Self-pay

## 2020-10-12 ENCOUNTER — Encounter (HOSPITAL_COMMUNITY): Payer: Self-pay | Admitting: Physical Therapy

## 2020-10-12 ENCOUNTER — Ambulatory Visit (HOSPITAL_COMMUNITY): Payer: Medicaid Other | Admitting: Physical Therapy

## 2020-10-12 DIAGNOSIS — R293 Abnormal posture: Secondary | ICD-10-CM | POA: Diagnosis not present

## 2020-10-12 DIAGNOSIS — M545 Low back pain, unspecified: Secondary | ICD-10-CM

## 2020-10-12 NOTE — Therapy (Signed)
[facility] [place], [place], [address] Phone: [phone]   Fax:  [phone]  Physical Therapy Treatment  Patient Details  Name: Derrick Clark MRN: [id number] Date of Birth: [birth year] Referring Provider (PT): Judd Lien MD   Encounter Date: 10/12/2020   PT End of Session - 10/12/20 1535    Visit Number 5    Number of Visits 8    Date for PT Re-Evaluation 10/19/20    Authorization Type [place] Medicaid Healthy Blue    Authorization Time Period 8 visists approved from 09/21/20 to 10/19/20    Authorization - Visit Number 5    Authorization - Number of Visits 8    PT Start Time 8185    PT Stop Time 1610    PT Time Calculation (min) 40 min    Activity Tolerance Patient tolerated treatment well    Behavior During Therapy [facility] for tasks assessed/performed           Past Medical History:  Diagnosis Date   Abdominal hernia    Arthritis    Diabetes mellitus without complication ([place])    Nonhealing surgical wound    nonviable tissue   PAD (peripheral artery disease) ([place])     Past Surgical History:  Procedure Laterality Date   ABDOMINAL AORTOGRAM W/LOWER EXTREMITY N/A 01/31/2019   Procedure: ABDOMINAL AORTOGRAM W/LOWER EXTREMITY;  Surgeon: Waynetta Sandy, MD;  Location: [place] [facility];  Service: Cardiovascular;  Laterality: N/A;   AMPUTATION Right 02/01/2019   Procedure: AMPUTATION RIGHT FIFTH TOE;  Surgeon: Angelia Mould, MD;  Location: [place];  Service: Vascular;  Laterality: Right;   AMPUTATION Right 02/10/2019   Procedure: AMPUTATION RAY RIGHT 5TH TOE WITH DEBRIDEMENT WOUND BED;  Surgeon: Waynetta Sandy, MD;  Location: [place];  Service: Vascular;  Laterality: Right;   AMPUTATION TOE Right 02/10/2019   5TH    PERIPHERAL VASCULAR INTERVENTION Right 01/31/2019   Procedure: PERIPHERAL VASCULAR INTERVENTION;  Surgeon: Waynetta Sandy, MD;  Location: [facility];   Service: Cardiovascular;  Laterality: Right;  SFA   SHOULDER SURGERY Left     There were no vitals filed for this visit.   Subjective Assessment - 10/12/20 1535    Subjective States that he doesnt feel good because he has been going up and down a whole bunch stairs so right knee is bothering him and both shoulders are bothering him and he doesnt know what he did to the left shoulder it just started hurting. States his back is alright right now. States he does the exercises when he came.  States that overall he feels about 70% better.    Pertinent History PVD, DM II    Currently in Pain? Yes    Pain Location Back    Pain Orientation Right;Lower    Pain Descriptors / Indicators Sharp    Pain Frequency Intermittent    Aggravating Factors  with moving              [facility] PT Assessment - 10/12/20 0001      Assessment   Medical Diagnosis LBP    Referring Provider (PT) Judd Lien MD    Next MD Visit 10/20/20      AROM   Lumbar Flexion 25% limited    Lumbar Extension 90% limited   pain in low back    Lumbar - Right Side Bend 25% limited     Lumbar - Left Side Bend 25% limited  Lumbar - Right Rotation 25% limited   pain in small of low back   Lumbar - Left Rotation 25% limited       Transfers   Five time sit to stand comments  9.4 seconds with no UES    was 15.5 seconds with no UEs                        [facility] Adult PT Treatment/Exercise - 10/12/20 0001      Therapeutic Activites    Therapeutic Activities Lifting    Lifting lifting boxes 5 minutes      Lumbar Exercises: Stretches   Other Lumbar Stretch Exercise seated lumbar flexion x5 10" holds       Lumbar Exercises: Standing   Other Standing Lumbar Exercises hip extension 3x10 5" holds B       Lumbar Exercises: Seated   Sit to Stand 5 reps   no UE support 4 sets    Other Seated Lumbar Exercises seated trunk rotation 2x5 10" holds B     Other Seated Lumbar Exercises lumbar extension 2x5 5" holds                    PT Education - 10/12/20 1601    Education Details on body mechanics with lifting and performing exercises this weekend between lifting this weekend with moving    Person(s) Educated Patient    Methods Explanation    Comprehension Verbalized understanding            PT Short Term Goals - 09/24/20 1432      PT SHORT TERM GOAL #1   Title Patient will be independent with initial HEP and self-management strategies to improve functional outcomes    Time 2    Period Weeks    Status On-going    Target Date 10/05/20             PT Long Term Goals - 10/12/20 1536      PT LONG TERM GOAL #1   Title Patient will be able to perform stand x 5 in < 10 seconds to demonstrate improvement in functional mobility and reduced risk for falls.    Baseline 9.4 seconds without use of hands    Time 4    Period Weeks    Status Achieved      PT LONG TERM GOAL #2   Title Patient will report at least 70% overall improvement in subjective complaint to indicate improvement in ability to perform ADLs.    Baseline 70% better    Time 4    Period Weeks    Status Achieved      PT LONG TERM GOAL #3   Title Patient will improve lumbar AROM by 25% in all restricted planes for improved ability to perform functional mobility tasks and ADLs.    Time 4    Period Weeks    Status On-going                 Plan - 10/12/20 1552    Clinical Impression Statement Patient has met 0/1 short term goals and 2/3 long term goals at this time. Overall patient is improving towards goals but continues to have limitations with lumbar mobility. Pain in shoulders and knee did not limit todays session and checked in with patient to make sure symptoms were not intensifying in extremities with any exercises. Added seated stretches secondary to easier position to get into to perform exercise  at home. Will continue with current POC.    Examination-Activity Limitations Stairs;Squat     Examination-Participation Restrictions Yard Work;Cleaning;Community Activity    Stability/Clinical Decision Making Stable/Uncomplicated    Rehab Potential Good    PT Frequency 2x / week    PT Duration 4 weeks    PT Treatment/Interventions ADLs/Self Care Home Management;Aquatic Therapy;Biofeedback;Cryotherapy;Parrafin;Fluidtherapy;Therapeutic activities;Functional mobility training;Traction;Ultrasound;Patient/family education;Manual lymph drainage;Manual techniques;Dry needling;Spinal Manipulations;Energy conservation;Splinting;Visual/perceptual remediation/compensation;Passive range of motion;Scar mobilization;Vestibular;Vasopneumatic Device;Taping;Joint Manipulations;Compression bandaging;Orthotic Fit/Training;Balance training;Contrast Bath;DME Instruction;Electrical Stimulation;Iontophoresis 75m/ml Dexamethasone;Therapeutic exercise;Moist Heat;Gait training;Stair training;Neuromuscular re-education    PT Next Visit Plan Progress lumbar mobility and hip and core strength as tolerated.  Progress to standing when able.    PT Home Exercise Plan 09/20/20: SKTC, HS stretch, piriformis stretch; 9/27:  LTR; ab set , bridge sitting in good posture.; 10/15 seated lumbar flexion and trunk rotation    Consulted and Agree with Plan of Care Patient           Patient will benefit from skilled therapeutic intervention in order to improve the following deficits and impairments:  Pain, Improper body mechanics, Increased fascial restricitons, Decreased activity tolerance, Impaired flexibility, Hypomobility, Decreased strength, Decreased range of motion, Postural dysfunction  Visit Diagnosis: Abnormal posture  Low back pain, unspecified back pain laterality, unspecified chronicity, unspecified whether sciatica present     Problem List Patient Active Problem List   Diagnosis Date Noted   PAD (peripheral artery disease) ([place] 02/10/2019   Peripheral artery disease ([place] 02/01/2019   Peripheral arterial  disease ([place] 01/31/2019   4:11 PM, 10/12/20 MJerene Pitch DPT [facility] [phone]  [place] [address]Phone: [phone]  Fax:  [phone] Name: Derrick NOVOSADMRN: [id number] of Birth: 6November 08, 1962

## 2020-10-16 ENCOUNTER — Other Ambulatory Visit: Payer: Self-pay

## 2020-10-16 ENCOUNTER — Ambulatory Visit (HOSPITAL_COMMUNITY): Payer: Medicaid Other

## 2020-10-16 ENCOUNTER — Encounter (HOSPITAL_COMMUNITY): Payer: Self-pay

## 2020-10-16 DIAGNOSIS — R293 Abnormal posture: Secondary | ICD-10-CM | POA: Diagnosis not present

## 2020-10-16 DIAGNOSIS — M545 Low back pain, unspecified: Secondary | ICD-10-CM | POA: Diagnosis not present

## 2020-10-16 NOTE — Therapy (Signed)
[facility], [place], [address] Phone: [phone]   Fax:  [phone]  Physical Therapy Treatment  Patient Details  Name: Derrick Clark MRN: [id number] Date of Birth: November 29, 1961 Referring Provider (PT): Judd Lien MD   Encounter Date: 10/16/2020   PT End of Session - 10/16/20 1719    Visit Number 6    Number of Visits 8    Date for PT Re-Evaluation 10/19/20    Authorization Type [place] Medicaid Healthy Blue    Authorization Time Period 8 visists approved from 09/21/20 to 10/19/20    Authorization - Visit Number 5   correct number of approved visits   Authorization - Number of Visits 8    PT Start Time 1705    PT Stop Time 1743    PT Time Calculation (min) 38 min    Activity Tolerance Patient tolerated treatment well    Behavior During Therapy [facility] for tasks assessed/performed           Past Medical History:  Diagnosis Date  . Abdominal hernia   . Arthritis   . Diabetes mellitus without complication ([place])   . Nonhealing surgical wound    nonviable tissue  . PAD (peripheral artery disease) ([place])     Past Surgical History:  Procedure Laterality Date  . ABDOMINAL AORTOGRAM W/LOWER EXTREMITY N/A 01/31/2019   Procedure: ABDOMINAL AORTOGRAM W/LOWER EXTREMITY;  Surgeon: Waynetta Sandy, MD;  Location: [place] [facility];  Service: Cardiovascular;  Laterality: N/A;  . AMPUTATION Right 02/01/2019   Procedure: AMPUTATION RIGHT FIFTH TOE;  Surgeon: Angelia Mould, MD;  Location: [place];  Service: Vascular;  Laterality: Right;  . AMPUTATION Right 02/10/2019   Procedure: AMPUTATION RAY RIGHT 5TH TOE WITH DEBRIDEMENT WOUND BED;  Surgeon: Waynetta Sandy, MD;  Location: [place];  Service: Vascular;  Laterality: Right;  . AMPUTATION TOE Right 02/10/2019   5TH   . PERIPHERAL VASCULAR INTERVENTION Right 01/31/2019   Procedure: PERIPHERAL VASCULAR INTERVENTION;  Surgeon: Waynetta Sandy, MD;   Location: [place] [facility];  Service: Cardiovascular;  Laterality: Right;  SFA  . SHOULDER SURGERY Left     There were no vitals filed for this visit.   Subjective Assessment - 10/16/20 1708    Subjective Pt reports LBP across the whole back with increased pain to Rt hip with certain movements, pain scale 3-4/10.  Reports he has done a lot of lifting this weekend, has been moving homes.    Pertinent History PVD, DM II    Patient Stated Goals Be more flexible without pain    Currently in Pain? Yes    Pain Score 4     Pain Location Back    Pain Orientation Lower;Right    Pain Descriptors / Indicators Sharp;Dull    Pain Type Chronic pain    Pain Onset More than a month ago    Pain Frequency Intermittent    Aggravating Factors  with moving`    Pain Relieving Factors laying down    Effect of Pain on Daily Activities limits                             [facility] Adult PT Treatment/Exercise - 10/16/20 0001      Lumbar Exercises: Stretches   Prone on Elbows Stretch 1 rep;60 seconds    Press Ups 5 reps;10 seconds      Lumbar Exercises: Standing  Functional Squats 10 reps    Functional Squats Limitations 3D hip excursion    Other Standing Lumbar Exercises hip abduction and extension 2x 10      Lumbar Exercises: Seated   Other Seated Lumbar Exercises thoracic rotation3    Other Seated Lumbar Exercises seated tall posture with ab set; rows 10x      Lumbar Exercises: Quadruped   Madcat/Old Horse 5 reps    Madcat/Old Horse Limitations 10" holds                    PT Short Term Goals - 09/24/20 1432      PT SHORT TERM GOAL #1   Title Patient will be independent with initial HEP and self-management strategies to improve functional outcomes    Time 2    Period Weeks    Status On-going    Target Date 10/05/20             PT Long Term Goals - 10/12/20 1536      PT LONG TERM GOAL #1   Title Patient will be able to perform stand x 5 in < 10 seconds  to demonstrate improvement in functional mobility and reduced risk for falls.    Baseline 9.4 seconds without use of hands    Time 4    Period Weeks    Status Achieved      PT LONG TERM GOAL #2   Title Patient will report at least 70% overall improvement in subjective complaint to indicate improvement in ability to perform ADLs.    Baseline 70% better    Time 4    Period Weeks    Status Achieved      PT LONG TERM GOAL #3   Title Patient will improve lumbar AROM by 25% in all restricted planes for improved ability to perform functional mobility tasks and ADLs.    Time 4    Period Weeks    Status On-going                 Plan - 10/16/20 1720    Clinical Impression Statement Session focus on lumbar and hip mobility as well as gluteal strengthening.  Resumed prone activities to improve lumbar extension and strenghtening, pt reports certalization with pain and increased reps feels like a stretch.  Reviewed proper mechanics with lifting as pt is moving to reduce injury wiht functional tasks.  Added quadruped cat/camel to improve spinal mobilty with no reports of increased pain through session.  Also progressed postural and core strengthening.  No reports of increased pain through session, was limited by fatigue with activities.    Examination-Activity Limitations Stairs;Squat    Examination-Participation Restrictions Yard Work;Cleaning;Community Activity    Stability/Clinical Decision Making Stable/Uncomplicated    Clinical Decision Making Low    Rehab Potential Good    PT Frequency 2x / week    PT Duration 4 weeks    PT Treatment/Interventions ADLs/Self Care Home Management;Aquatic Therapy;Biofeedback;Cryotherapy;Parrafin;Fluidtherapy;Therapeutic activities;Functional mobility training;Traction;Ultrasound;Patient/family education;Manual lymph drainage;Manual techniques;Dry needling;Spinal Manipulations;Energy conservation;Splinting;Visual/perceptual remediation/compensation;Passive  range of motion;Scar mobilization;Vestibular;Vasopneumatic Device;Taping;Joint Manipulations;Compression bandaging;Orthotic Fit/Training;Balance training;Contrast Bath;DME Instruction;Electrical Stimulation;Iontophoresis 4mg /ml Dexamethasone;Therapeutic exercise;Moist Heat;Gait training;Stair training;Neuromuscular re-education    PT Next Visit Plan Progress lumbar mobility and hip and core strength as tolerated.  Progress to standing when able.    PT Home Exercise Plan 09/20/20: SKTC, HS stretch, piriformis stretch; 9/27:  LTR; ab set , bridge sitting in good posture.; 10/15 seated lumbar flexion and trunk rotation  Patient will benefit from skilled therapeutic intervention in order to improve the following deficits and impairments:  Pain, Improper body mechanics, Increased fascial restricitons, Decreased activity tolerance, Impaired flexibility, Hypomobility, Decreased strength, Decreased range of motion, Postural dysfunction  Visit Diagnosis: Abnormal posture  Low back pain, unspecified back pain laterality, unspecified chronicity, unspecified whether sciatica present     Problem List Patient Active Problem List   Diagnosis Date Noted  . PAD (peripheral artery disease) ([place]) 02/10/2019  . Peripheral artery disease ([place]) 02/01/2019  . Peripheral arterial disease ([place]) 01/31/2019   Ihor Austin, LPTA/CLT; CBIS [phone]  Aldona Lento 10/16/2020, 6:39 PM  [facility] [address] [place], [place], [address] Phone: [phone]   Fax:  [phone]  Name: KARLO GOEDEN MRN: [id number] Date of Birth: 27-Sep-1961

## 2020-10-19 ENCOUNTER — Telehealth (HOSPITAL_COMMUNITY): Payer: Self-pay | Admitting: Physical Therapy

## 2020-10-19 ENCOUNTER — Ambulatory Visit (HOSPITAL_COMMUNITY): Payer: Medicaid Other | Admitting: Physical Therapy

## 2020-10-19 DIAGNOSIS — E1165 Type 2 diabetes mellitus with hyperglycemia: Secondary | ICD-10-CM | POA: Diagnosis not present

## 2020-10-19 DIAGNOSIS — Z591 Inadequate housing: Secondary | ICD-10-CM | POA: Diagnosis not present

## 2020-10-19 DIAGNOSIS — E1142 Type 2 diabetes mellitus with diabetic polyneuropathy: Secondary | ICD-10-CM | POA: Diagnosis not present

## 2020-10-19 DIAGNOSIS — E782 Mixed hyperlipidemia: Secondary | ICD-10-CM | POA: Diagnosis not present

## 2020-10-19 DIAGNOSIS — Z608 Other problems related to social environment: Secondary | ICD-10-CM | POA: Diagnosis not present

## 2020-10-19 DIAGNOSIS — L219 Seborrheic dermatitis, unspecified: Secondary | ICD-10-CM | POA: Diagnosis not present

## 2020-10-19 DIAGNOSIS — E8881 Metabolic syndrome: Secondary | ICD-10-CM | POA: Diagnosis not present

## 2020-10-19 NOTE — Telephone Encounter (Signed)
pt cancelled because they are in the middle of moving 

## 2020-10-23 ENCOUNTER — Telehealth (HOSPITAL_COMMUNITY): Payer: Self-pay | Admitting: Physical Therapy

## 2020-10-23 ENCOUNTER — Encounter (HOSPITAL_COMMUNITY): Payer: Medicaid Other | Admitting: Physical Therapy

## 2020-10-23 ENCOUNTER — Telehealth (HOSPITAL_COMMUNITY): Payer: Self-pay

## 2020-10-23 ENCOUNTER — Ambulatory Visit (HOSPITAL_COMMUNITY): Payer: Medicaid Other | Admitting: Physical Therapy

## 2020-10-23 NOTE — Telephone Encounter (Signed)
He cx due to he is hurting to much today to come for PT

## 2020-10-23 NOTE — Telephone Encounter (Signed)
S/w Penny Krupp--L/m cx due to cjc being out of the office - pt will be seen on 11/4.

## 2020-10-26 ENCOUNTER — Encounter (HOSPITAL_COMMUNITY): Payer: Medicaid Other

## 2020-10-30 ENCOUNTER — Encounter (HOSPITAL_COMMUNITY): Payer: Medicaid Other

## 2020-10-31 ENCOUNTER — Ambulatory Visit (HOSPITAL_COMMUNITY): Payer: Medicaid Other

## 2020-11-01 ENCOUNTER — Ambulatory Visit (HOSPITAL_COMMUNITY): Payer: Medicaid Other | Admitting: Physical Therapy

## 2020-11-01 ENCOUNTER — Encounter (HOSPITAL_COMMUNITY): Payer: Self-pay | Admitting: Physical Therapy

## 2020-11-01 NOTE — Therapy (Signed)
[place], [place], [address] Phone: [phone]   Fax:  [phone]  Patient Details  Name: ARMSTRONG CREASY MRN: [id number] Date of Birth: Sep 23, 1961 Referring Provider:  No ref. provider found  Encounter Date: 11/01/2020   PHYSICAL THERAPY DISCHARGE SUMMARY  Visits from Start of Care: 6  Current functional level related to goals / functional outcomes: Patient has met 1/1 short term goals and 2/3 long term goals with remaining goal unable to assess as patient has not returned. Spoke with patient who stated symptoms and mobility have improved but he remains limited by intermittent symptoms with increased activity/lifting.    Remaining deficits: Intermittent pain increases   Education / Equipment: HEP, returning to PT if needed  Plan: Patient agrees to discharge.  Patient goals were partially met. Patient is being discharged due to being pleased with the current functional level.  ?????       9:57 AM, 11/01/20 Mearl Latin PT, DPT Physical Therapist at [place], [place], [address] Phone: [phone]   Fax:  [phone]

## 2020-12-05 DIAGNOSIS — M79641 Pain in right hand: Secondary | ICD-10-CM | POA: Diagnosis not present

## 2020-12-05 DIAGNOSIS — Z6839 Body mass index (BMI) 39.0-39.9, adult: Secondary | ICD-10-CM | POA: Diagnosis not present

## 2020-12-05 DIAGNOSIS — M79631 Pain in right forearm: Secondary | ICD-10-CM | POA: Diagnosis not present

## 2020-12-06 DIAGNOSIS — M79603 Pain in arm, unspecified: Secondary | ICD-10-CM | POA: Diagnosis not present

## 2020-12-06 DIAGNOSIS — M79641 Pain in right hand: Secondary | ICD-10-CM | POA: Diagnosis not present

## 2020-12-10 ENCOUNTER — Ambulatory Visit: Payer: Medicaid Other

## 2020-12-10 ENCOUNTER — Other Ambulatory Visit (HOSPITAL_COMMUNITY): Payer: Medicaid Other

## 2020-12-10 ENCOUNTER — Encounter (HOSPITAL_COMMUNITY): Payer: Medicaid Other

## 2021-01-03 DIAGNOSIS — Z23 Encounter for immunization: Secondary | ICD-10-CM | POA: Diagnosis not present

## 2021-01-23 ENCOUNTER — Ambulatory Visit: Payer: Medicaid Other | Admitting: Internal Medicine

## 2021-02-14 DIAGNOSIS — J449 Chronic obstructive pulmonary disease, unspecified: Secondary | ICD-10-CM | POA: Diagnosis not present

## 2021-02-14 DIAGNOSIS — E7849 Other hyperlipidemia: Secondary | ICD-10-CM | POA: Diagnosis not present

## 2021-02-14 DIAGNOSIS — Z1329 Encounter for screening for other suspected endocrine disorder: Secondary | ICD-10-CM | POA: Diagnosis not present

## 2021-02-14 DIAGNOSIS — E8881 Metabolic syndrome: Secondary | ICD-10-CM | POA: Diagnosis not present

## 2021-02-14 DIAGNOSIS — E782 Mixed hyperlipidemia: Secondary | ICD-10-CM | POA: Diagnosis not present

## 2021-02-14 DIAGNOSIS — Z1159 Encounter for screening for other viral diseases: Secondary | ICD-10-CM | POA: Diagnosis not present

## 2021-02-14 DIAGNOSIS — E1165 Type 2 diabetes mellitus with hyperglycemia: Secondary | ICD-10-CM | POA: Diagnosis not present

## 2021-02-14 DIAGNOSIS — E1142 Type 2 diabetes mellitus with diabetic polyneuropathy: Secondary | ICD-10-CM | POA: Diagnosis not present

## 2021-02-21 DIAGNOSIS — M25511 Pain in right shoulder: Secondary | ICD-10-CM | POA: Diagnosis not present

## 2021-02-21 DIAGNOSIS — M25521 Pain in right elbow: Secondary | ICD-10-CM | POA: Diagnosis not present

## 2021-02-21 DIAGNOSIS — M7541 Impingement syndrome of right shoulder: Secondary | ICD-10-CM | POA: Diagnosis not present

## 2021-02-21 DIAGNOSIS — G8929 Other chronic pain: Secondary | ICD-10-CM | POA: Diagnosis not present
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
## 2021-02-22 ENCOUNTER — Ambulatory Visit: Payer: Self-pay

## 2021-02-22 ENCOUNTER — Other Ambulatory Visit: Payer: Self-pay

## 2021-02-22 ENCOUNTER — Ambulatory Visit: Payer: Medicaid Other | Admitting: Internal Medicine

## 2021-02-22 ENCOUNTER — Encounter: Payer: Self-pay | Admitting: Internal Medicine

## 2021-02-22 VITALS — BP 125/76 | HR 69 | Resp 17 | Ht 69.0 in | Wt 286.0 lb

## 2021-02-22 DIAGNOSIS — M79642 Pain in left hand: Secondary | ICD-10-CM

## 2021-02-22 DIAGNOSIS — M7752 Other enthesopathy of left foot: Secondary | ICD-10-CM

## 2021-02-22 DIAGNOSIS — F17200 Nicotine dependence, unspecified, uncomplicated: Secondary | ICD-10-CM | POA: Diagnosis not present

## 2021-02-22 DIAGNOSIS — M7751 Other enthesopathy of right foot: Secondary | ICD-10-CM

## 2021-02-22 DIAGNOSIS — M778 Other enthesopathies, not elsewhere classified: Secondary | ICD-10-CM | POA: Insufficient documentation

## 2021-02-22 DIAGNOSIS — M2540 Effusion, unspecified joint: Secondary | ICD-10-CM

## 2021-02-22 DIAGNOSIS — M170 Bilateral primary osteoarthritis of knee: Secondary | ICD-10-CM | POA: Diagnosis not present

## 2021-02-22 DIAGNOSIS — M159 Polyosteoarthritis, unspecified: Secondary | ICD-10-CM | POA: Diagnosis not present

## 2021-02-22 NOTE — Patient Instructions (Addendum)
Rheumatoid Factor Test Why am I having this test? The rheumatoid factor test is used to help diagnose certain autoimmune diseases. Normally, your body makes protective proteins called antibodies (IgM, IgG, and IgA) to help fight off infections. If you have an autoimmune disease, your body may make a collection of antibodies that do not function correctly (autoantibodies). They attack tissues that are wrongly identified as foreign. In some autoimmune diseases, these autoantibodies are known as the rheumatoid factor (RF). You may have this test if your health care provider suspects that you have an autoimmune disease, such as:  Rheumatoid arthritis (RA).  Systemic lupus erythematosus (SLE).  Sjgren's syndrome.  Mixed connective tissue disease. A majority of people who have rheumatoid arthritis have a positive rheumatoid factor. Raised levels of these autoantibodies can also sometimes be a sign of other autoimmune diseases. However, it is also possible for the RF test to be negative even when a disease is present. Likewise, a small number of people may have a positive RF test when an autoimmune disease is not actually present. Other tests may be needed to help make a diagnosis. What is being tested? This test checks your blood for the RF autoantibodies. What kind of sample is taken? A blood sample is required for this test. It is usually collected by inserting a needle into a blood vessel or by sticking a finger with a small needle.   How are the results reported? Your test result will be reported as either positive or negative for RF autoantibodies. What do the results mean? A negative result means that no RF or only a small amount was found in your blood. This means that it is unlikely that you have an autoimmune disease. A positive result means that a larger amount of RF autoantibodies was found in your blood. This may indicate that you have RA or another autoimmune disease. Your health care  provider will talk to you about doing more tests to confirm your results. Talk with your health care provider about what your results mean. Questions to ask your health care provider Ask your health care provider, or the department that is doing the test:  When will my results be ready?  How will I get my results?  What are my treatment options?  What other tests do I need?  What are my next steps? Summary  The rheumatoid factor test is used to help diagnose certain autoimmune diseases.  In some autoimmune diseases, the body makes autoantibodies known as the rheumatoid factor (RF), which attack tissues that are wrongly identified as foreign.  A negative result means that no RF or only a small amount was found in your blood.  A positive result means that a larger amount of RF autoantibodies was found in your blood.  Talk with your health care provider about what your results mean.    Erythrocyte Sedimentation Rate Test Why am I having this test? The erythrocyte sedimentation rate (ESR) test is used to help find illnesses related to:  Sudden (acute) or long-term (chronic) infections.  Inflammation.  The body's disease-fighting system attacking healthy cells (autoimmune diseases).  Cancer.  Tissue death. If you have symptoms that may be related to any of these illnesses, your health care provider may do an ESR test before doing more specific tests. If you have an inflammatory immune disease, such as rheumatoid arthritis, you may have this test to help monitor your therapy. What is being tested? This test measures how long it takes for your  red blood cells (erythrocytes) to settle in a solution over a certain amount of time (sedimentation rate). When you have an infection or inflammation, your red blood cells clump together and settle faster. The sedimentation rate provides information about how much inflammation is present in the body. What kind of sample is taken? A blood  sample is required for this test. It is usually collected by inserting a needle into a blood vessel.   How do I prepare for this test? Follow any instructions from your health care provider about changing or stopping your regular medicines. Tell a health care provider about:  Any allergies you have.  All medicines you are taking, including vitamins, herbs, eye drops, creams, and over-the-counter medicines.  Any blood disorders you have.  Any surgeries you have had.  Any medical conditions you have, such as thyroid or kidney disease.  Whether you are pregnant or may be pregnant. How are the results reported? Your results will be reported as a value that measures sedimentation rate in millimeters per hour (mm/hr). Your health care provider will compare your results to normal ranges that were established after testing a large group of people (reference values). Reference values may vary among labs and hospitals. For this test, common reference values, which vary by age and gender, are:  Newborn: 0-2 mm/hr.  Child, up to puberty: 0-10 mm/hr.  Male: ? Under 50 years: 0-20 mm/hr. ? 50-85 years: 0-30 mm/hr. ? Over 85 years: 0-42 mm/hr.  Male: ? Under 50 years: 0-15 mm/hr. ? 50-85 years: 0-20 mm/hr. ? Over 85 years: 0-30 mm/hr. Certain conditions or medicines may cause ESR levels to be falsely lower or higher, such as:  Pregnancy.  Obesity.  Steroids, birth control pills, and blood thinners.  Thyroid or kidney disease. What do the results mean? Results that are within reference values are considered normal, meaning that the level of inflammation in your body is healthy. High ESR levels mean that there is inflammation in your body. You will have more tests to help make a diagnosis. Inflammation may result from many different conditions or injuries. Talk with your health care provider about what your results mean. Questions to ask your health care provider Ask your health care  provider, or the department that is doing the test:  When will my results be ready?  How will I get my results?  What are my treatment options?  What other tests do I need?  What are my next steps? Summary  The erythrocyte sedimentation rate (ESR) test is used to help find illnesses associated with sudden (acute) or long-term (chronic) infections, inflammation, autoimmune diseases, cancer, or tissue death.  If you have symptoms that may be related to any of these illnesses, your health care provider may do an ESR test before doing more specific tests. If you have an inflammatory immune disease, such as rheumatoid arthritis, you may have this test to help monitor your therapy.  This test measures how long it takes for your red blood cells (erythrocytes) to settle in a solution over a certain amount of time (sedimentation rate). This provides information about how much inflammation is present in the body. This information is not intended to replace advice given to you by your health care provider. Make sure you discuss any questions you have with your health care provider. Document Revised: 08/17/2020 Document Reviewed: 08/17/2020 Elsevier Patient Education  [place].

## 2021-02-25 LAB — SEDIMENTATION RATE: Sed Rate: 14 mm/h (ref 0–20)

## 2021-02-25 LAB — RHEUMATOID FACTOR: Rheumatoid fact SerPl-aCnc: 156 IU/mL — ABNORMAL HIGH (ref <14)

## 2021-02-25 LAB — CYCLIC CITRUL PEPTIDE ANTIBODY, IGG: Cyclic Citrullin Peptide Ab: <16 UNITS

## 2021-02-25 NOTE — Progress Notes (Signed)
Lab test is positive for rheumatoid arthritis antibodies. Xrays do not show any bone erosion from inflammation which is good. I recommend we follow up next week to take another look and talk about starting something for RA since that looks likely to be causing his joint swelling.

## 2021-03-05 ENCOUNTER — Ambulatory Visit: Payer: Medicaid Other | Admitting: Internal Medicine
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
## 2021-03-06 ENCOUNTER — Other Ambulatory Visit: Payer: Self-pay

## 2021-03-06 ENCOUNTER — Ambulatory Visit: Payer: Medicaid Other | Admitting: Internal Medicine

## 2021-03-06 ENCOUNTER — Encounter: Payer: Self-pay | Admitting: Internal Medicine

## 2021-03-06 VITALS — BP 118/65 | HR 68 | Ht 69.5 in | Wt 285.6 lb

## 2021-03-06 DIAGNOSIS — M159 Polyosteoarthritis, unspecified: Secondary | ICD-10-CM

## 2021-03-06 DIAGNOSIS — Z79899 Other long term (current) drug therapy: Secondary | ICD-10-CM

## 2021-03-06 DIAGNOSIS — M059 Rheumatoid arthritis with rheumatoid factor, unspecified: Secondary | ICD-10-CM | POA: Diagnosis not present

## 2021-03-06 NOTE — Patient Instructions (Signed)
I will plan to send the methotrexate prescription medicine for arthritis after seeing your lab results and that they are okay. The starting does would be 6 tablets (15 mg) once weekly and to take folic acid 1 mg daily.  Methotrexate tablets What is this medicine? METHOTREXATE (METH oh TREX ate) is a chemotherapy drug used to treat cancer including breast cancer, leukemia, and lymphoma. This medicine can also be used to treat psoriasis and certain kinds of arthritis. This medicine may be used for other purposes; ask your health care provider or pharmacist if you have questions. COMMON BRAND NAME(S): Rheumatrex, Trexall What should I tell my health care provider before I take this medicine? They need to know if you have any of these conditions:  fluid in the stomach area or lungs  if you often drink alcohol  infection or immune system problems  kidney disease or on hemodialysis  liver disease  low blood counts, like low white cell, platelet, or red cell counts  lung disease  radiation therapy  stomach ulcers  ulcerative colitis  an unusual or allergic reaction to methotrexate, other medicines, foods, dyes, or preservatives  pregnant or trying to get pregnant  breast-feeding How should I use this medicine? Take this medicine by mouth with a glass of water. Follow the directions on the prescription label. Take your medicine at regular intervals. Do not take it more often than directed. Do not stop taking except on your doctor's advice. Make sure you know why you are taking this medicine and how often you should take it. If this medicine is used for a condition that is not cancer, like arthritis or psoriasis, it should be taken weekly, NOT daily. Taking this medicine more often than directed can cause serious side effects, even death. Talk to your healthcare provider about safe handling and disposal of this medicine. You may need to take special precautions. Talk to your  pediatrician regarding the use of this medicine in children. While this drug may be prescribed for selected conditions, precautions do apply. Overdosage: If you think you have taken too much of this medicine contact a poison control center or emergency room at once. NOTE: This medicine is only for you. Do not share this medicine with others. What if I miss a dose? If you miss a dose, talk with your doctor or health care professional. Do not take double or extra doses. What may interact with this medicine? Do not take this medicine with any of the following medications:  acitretin This medicine may also interact with the following medication:  aspirin and aspirin-like medicines including salicylates  azathioprine  certain antibiotics like penicillins, tetracycline, and chloramphenicol  certain medicines that treat or prevent blood clots like warfarin, apixaban, dabigatran, and rivaroxaban  certain medicines for stomach problems like esomeprazole, omeprazole, pantoprazole  cyclosporine  dapsone  diuretics  gold  hydroxychloroquine  live virus vaccines  medicines for infection like acyclovir, adefovir, amphotericin B, bacitracin, cidofovir, foscarnet, ganciclovir, gentamicin, pentamidine, vancomycin  mercaptopurine  NSAIDs, medicines for pain and inflammation, like ibuprofen or naproxen  other cytotoxic agents  pamidronate  pemetrexed  penicillamine  phenylbutazone  phenytoin  probenecid  pyrimethamine  retinoids such as isotretinoin and tretinoin  steroid medicines like prednisone or cortisone  sulfonamides like sulfasalazine and trimethoprim/sulfamethoxazole  theophylline  zoledronic acid This list may not describe all possible interactions. Give your health care provider a list of all the medicines, herbs, non-prescription drugs, or dietary supplements you use. Also tell them if you  smoke, drink alcohol, or use illegal drugs. Some items may interact  with your medicine. What should I watch for while using this medicine? Avoid alcoholic drinks. This medicine can make you more sensitive to the sun. Keep out of the sun. If you cannot avoid being in the sun, wear protective clothing and use sunscreen. Do not use sun lamps or tanning beds/booths. You may need blood work done while you are taking this medicine. Call your doctor or health care professional for advice if you get a fever, chills or sore throat, or other symptoms of a cold or flu. Do not treat yourself. This drug decreases your body's ability to fight infections. Try to avoid being around people who are sick. This medicine may increase your risk to bruise or bleed. Call your doctor or health care professional if you notice any unusual bleeding. Be careful brushing or flossing your teeth or using a toothpick because you may get an infection or bleed more easily. If you have any dental work done, tell your dentist you are receiving this medicine. Check with your doctor or health care professional if you get an attack of severe diarrhea, nausea and vomiting, or if you sweat a lot. The loss of too much body fluid can make it dangerous for you to take this medicine. Talk to your doctor about your risk of cancer. You may be more at risk for certain types of cancers if you take this medicine. Do not become pregnant while taking this medicine or for 6 months after stopping it. Women should inform their health care provider if they wish to become pregnant or think they might be pregnant. Men should not father a child while taking this medicine and for 3 months after stopping it. There is potential for serious harm to an unborn child. Talk to your health care provider for more information. Do not breast-feed an infant while taking this medicine or for 1 week after stopping it. This medicine may make it more difficult to get pregnant or father a child. Talk to your health care provider if you are  concerned about your fertility. What side effects may I notice from receiving this medicine? Side effects that you should report to your doctor or health care professional as soon as possible:  allergic reactions like skin rash, itching or hives, swelling of the face, lips, or tongue  breathing problems or shortness of breath  diarrhea  dry, nonproductive cough  low blood counts - this medicine may decrease the number of white blood cells, red blood cells and platelets. You may be at increased risk for infections and bleeding.  mouth sores  redness, blistering, peeling or loosening of the skin, including inside the mouth  signs of infection - fever or chills, cough, sore throat, pain or trouble passing urine  signs and symptoms of bleeding such as bloody or black, tarry stools; red or dark-brown urine; spitting up blood or brown material that looks like coffee grounds; red spots on the skin; unusual bruising or bleeding from the eye, gums, or nose  signs and symptoms of kidney injury like trouble passing urine or change in the amount of urine  signs and symptoms of liver injury like dark yellow or brown urine; general ill feeling or flu-like symptoms; light-colored stools; loss of appetite; nausea; right upper belly pain; unusually weak or tired; yellowing of the eyes or skin Side effects that usually do not require medical attention (report to your doctor or health care professional if  they continue or are bothersome):  dizziness  hair loss  tiredness  upset stomach  vomiting This list may not describe all possible side effects. Call your doctor for medical advice about side effects. You may report side effects to FDA at 1-800-FDA-1088. Where should I keep my medicine? Keep out of the reach of children and pets. Store at room temperature between 20 and 25 degrees C (68 and 77 degrees F). Protect from light. Get rid of any unused medicine after the expiration date. Talk to  your health care provider about how to dispose of unused medicine. Special directions may apply. NOTE: This sheet is a summary. It may not cover all possible information. If you have questions about this medicine, talk to your doctor, pharmacist, or health care provider.  2021 Elsevier/Gold Standard (2020-07-16 10:40:39)

## 2021-03-07 LAB — CBC WITH DIFFERENTIAL/PLATELET
Absolute Monocytes: 1037 cells/uL — ABNORMAL HIGH (ref 200–950)
Basophils Absolute: 86 cells/uL (ref 0–200)
Basophils Relative: 0.8 %
Eosinophils Absolute: 248 cells/uL (ref 15–500)
Eosinophils Relative: 2.3 %
HCT: 49.8 % (ref 38.5–50.0)
Hemoglobin: 17.2 g/dL — ABNORMAL HIGH (ref 13.2–17.1)
Lymphs Abs: 2873 cells/uL (ref 850–3900)
MCH: 31.3 pg (ref 27.0–33.0)
MCHC: 34.5 g/dL (ref 32.0–36.0)
MCV: 90.7 fL (ref 80.0–100.0)
MPV: 11.3 fL (ref 7.5–12.5)
Monocytes Relative: 9.6 %
Neutro Abs: 6556 cells/uL (ref 1500–7800)
Neutrophils Relative %: 60.7 %
Platelets: 235 10*3/uL (ref 140–400)
RBC: 5.49 10*6/uL (ref 4.20–5.80)
RDW: 12.1 % (ref 11.0–15.0)
Total Lymphocyte: 26.6 %
WBC: 10.8 10*3/uL (ref 3.8–10.8)

## 2021-03-07 LAB — HEPATITIS PANEL, ACUTE
Hep A IgM: NONREACTIVE
Hep B C IgM: NONREACTIVE
Hepatitis B Surface Ag: NONREACTIVE
Hepatitis C Ab: NONREACTIVE
SIGNAL TO CUT-OFF: 0.01 (ref <1.00)

## 2021-03-07 LAB — COMPLETE METABOLIC PANEL WITH GFR
AG Ratio: 1.6 (calc) (ref 1.0–2.5)
ALT: 16 U/L (ref 9–46)
AST: 20 U/L (ref 10–35)
Albumin: 4.5 g/dL (ref 3.6–5.1)
Alkaline phosphatase (APISO): 59 U/L (ref 35–144)
BUN/Creatinine Ratio: 16 (calc) (ref 6–22)
BUN: 10 mg/dL (ref 7–25)
CO2: 24 mmol/L (ref 20–32)
Calcium: 9.4 mg/dL (ref 8.6–10.3)
Chloride: 105 mmol/L (ref 98–110)
Creat: 0.64 mg/dL — ABNORMAL LOW (ref 0.70–1.33)
GFR, Est African American: 124 mL/min/{1.73_m2} (ref >=60)
GFR, Est Non African American: 107 mL/min/{1.73_m2} (ref >=60)
Globulin: 2.8 g/dL (calc) (ref 1.9–3.7)
Glucose, Bld: 61 mg/dL — ABNORMAL LOW (ref 65–99)
Potassium: 4.9 mmol/L (ref 3.5–5.3)
Sodium: 141 mmol/L (ref 135–146)
Total Bilirubin: 0.4 mg/dL (ref 0.2–1.2)
Total Protein: 7.3 g/dL (ref 6.1–8.1)

## 2021-03-11 MED ORDER — FOLIC ACID 1 MG PO TABS: 1.0000 mg | ORAL_TABLET | Freq: Every day | ORAL | 0 refills | Status: DC

## 2021-03-11 MED ORDER — METHOTREXATE 2.5 MG PO TABS: 15.0000 mg | ORAL_TABLET | ORAL | 0 refills | Status: AC

## 2021-03-11 NOTE — Addendum Note (Signed)
Addended by: Collier Salina on: 03/11/2021 09:16 AM   Modules accepted: Orders

## 2021-03-11 NOTE — Progress Notes (Signed)
Lab results show normal blood count and liver and kidney function. No viral hepatitis seen. Recommend starting methotrexate 6 tablets once weekly as discussed in clinic, and folic acid 1 mg daily. He will need to comes and have his blood count and liver function test rechecked in about 4 weeks after starting.

## 2021-03-13 ENCOUNTER — Other Ambulatory Visit: Payer: Self-pay | Admitting: Orthopedic Surgery

## 2021-03-14 ENCOUNTER — Other Ambulatory Visit: Payer: Self-pay | Admitting: Orthopedic Surgery

## 2021-03-14 DIAGNOSIS — Z9889 Other specified postprocedural states: Secondary | ICD-10-CM

## 2021-03-14 DIAGNOSIS — M7541 Impingement syndrome of right shoulder: Secondary | ICD-10-CM

## 2021-04-20 ENCOUNTER — Other Ambulatory Visit: Payer: Medicaid Other

## 2021-04-30 ENCOUNTER — Other Ambulatory Visit: Payer: Self-pay

## 2021-04-30 ENCOUNTER — Other Ambulatory Visit: Payer: Medicaid Other

## 2021-04-30 DIAGNOSIS — Z79899 Other long term (current) drug therapy: Secondary | ICD-10-CM

## 2021-04-30 DIAGNOSIS — M059 Rheumatoid arthritis with rheumatoid factor, unspecified: Secondary | ICD-10-CM

## 2021-04-30 LAB — CBC WITH DIFFERENTIAL/PLATELET
Absolute Monocytes: 810 cells/uL (ref 200–950)
Basophils Absolute: 63 cells/uL (ref 0–200)
Basophils Relative: 0.7 %
Eosinophils Absolute: 279 cells/uL (ref 15–500)
Eosinophils Relative: 3.1 %
HCT: 46.4 % (ref 38.5–50.0)
Hemoglobin: 15.6 g/dL (ref 13.2–17.1)
Lymphs Abs: 2151 cells/uL (ref 850–3900)
MCH: 31.5 pg (ref 27.0–33.0)
MCHC: 33.6 g/dL (ref 32.0–36.0)
MCV: 93.5 fL (ref 80.0–100.0)
MPV: 10.3 fL (ref 7.5–12.5)
Monocytes Relative: 9 %
Neutro Abs: 5697 cells/uL (ref 1500–7800)
Neutrophils Relative %: 63.3 %
Platelets: 198 10*3/uL (ref 140–400)
RBC: 4.96 10*6/uL (ref 4.20–5.80)
RDW: 13.1 % (ref 11.0–15.0)
Total Lymphocyte: 23.9 %
WBC: 9 10*3/uL (ref 3.8–10.8)

## 2021-04-30 LAB — COMPLETE METABOLIC PANEL WITH GFR
AG Ratio: 1.8 (calc) (ref 1.0–2.5)
ALT: 15 U/L (ref 9–46)
AST: 20 U/L (ref 10–35)
Albumin: 4.2 g/dL (ref 3.6–5.1)
Alkaline phosphatase (APISO): 54 U/L (ref 35–144)
BUN/Creatinine Ratio: 14 (calc) (ref 6–22)
BUN: 10 mg/dL (ref 7–25)
CO2: 27 mmol/L (ref 20–32)
Calcium: 9.4 mg/dL (ref 8.6–10.3)
Chloride: 104 mmol/L (ref 98–110)
Creat: 0.69 mg/dL — ABNORMAL LOW (ref 0.70–1.33)
GFR, Est African American: 120 mL/min/{1.73_m2} (ref >=60)
GFR, Est Non African American: 104 mL/min/{1.73_m2} (ref >=60)
Globulin: 2.3 g/dL (calc) (ref 1.9–3.7)
Glucose, Bld: 133 mg/dL — ABNORMAL HIGH (ref 65–99)
Potassium: 4.1 mmol/L (ref 3.5–5.3)
Sodium: 139 mmol/L (ref 135–146)
Total Bilirubin: 0.3 mg/dL (ref 0.2–1.2)
Total Protein: 6.5 g/dL (ref 6.1–8.1)

## 2021-05-01 MED ORDER — METHOTREXATE 2.5 MG PO TABS: 15.0000 mg | ORAL_TABLET | ORAL | 0 refills | Status: DC

## 2021-05-01 NOTE — Progress Notes (Signed)
Lab test looks okay for continuing methotrexate treatment. If he is doing okay with the medication it might make sense to reschedule his follow up till a few weeks later more like 3 months after starting the medicine (start of June) since we do not need repeat labs and I would not increase dose sooner than that.

## 2021-05-08 ENCOUNTER — Other Ambulatory Visit: Payer: Self-pay

## 2021-05-08 ENCOUNTER — Ambulatory Visit: Payer: Medicaid Other | Admitting: Internal Medicine

## 2021-05-08 ENCOUNTER — Encounter: Payer: Self-pay | Admitting: Internal Medicine

## 2021-05-08 ENCOUNTER — Other Ambulatory Visit: Payer: Self-pay | Admitting: Internal Medicine

## 2021-05-08 VITALS — BP 121/76 | HR 69 | Ht 70.0 in | Wt 283.3 lb

## 2021-05-08 DIAGNOSIS — Z79899 Other long term (current) drug therapy: Secondary | ICD-10-CM | POA: Diagnosis not present

## 2021-05-08 DIAGNOSIS — M059 Rheumatoid arthritis with rheumatoid factor, unspecified: Secondary | ICD-10-CM

## 2021-05-08 DIAGNOSIS — M2540 Effusion, unspecified joint: Secondary | ICD-10-CM

## 2021-05-08 MED ORDER — LEFLUNOMIDE 20 MG PO TABS: 20.0000 mg | ORAL_TABLET | Freq: Every day | ORAL | 0 refills | Status: DC

## 2021-05-08 NOTE — Telephone Encounter (Signed)
Patient is discontinuing methotrexate and has not taken any dose since more than 1 week ago, removing medication from drug list today.

## 2021-05-08 NOTE — Progress Notes (Signed)
Office Visit Note  Patient: Derrick Clark             Date of Birth: 31-Jan-1961           MRN: [id number]             PCP: Caryl Bis, MD Referring: Caryl Bis, MD Visit Date: 05/08/2021   Subjective:  Medication Management (Patient's last dose of MTX was over a week ago, he has some side effects he would like to discuss. )   History of Present Illness: Derrick Clark is a 60 y.o. male here for follow up for seropositive rheumatoid arthritis after starting oral methotrexate about a month ago.  He took this weekly for 4 weeks but noticed persistent fatigue while taking the medication also described an increase in pain in the upper back and back of the neck that he is not sure if it was related.  Overall generally felt malaise and very poorly on the methotrexate so he stopped taking it since a week ago.  He still has some amount of ongoing joint pain but has not noticed any new episode of significant finger swelling.     Review of Systems  Constitutional: Positive for fatigue.  HENT: Positive for mouth dryness and nose dryness. Negative for mouth sores.   Eyes: Positive for pain, itching, visual disturbance and dryness.  Respiratory: Negative for cough, hemoptysis, shortness of breath and difficulty breathing.   Cardiovascular: Negative for chest pain, palpitations and swelling in legs/feet.  Gastrointestinal: Negative for abdominal pain, blood in stool, constipation and diarrhea.  Endocrine: Negative for increased urination.  Genitourinary: Negative for painful urination.  Musculoskeletal: Positive for arthralgias, joint pain, joint swelling, myalgias, muscle weakness, morning stiffness, muscle tenderness and myalgias.  Skin: Negative for color change, rash and redness.  Allergic/Immunologic: Negative for susceptible to infections.  Neurological: Positive for dizziness, headaches and weakness. Negative for numbness and memory loss.  Hematological: Negative for swollen glands.   Psychiatric/Behavioral: Negative for confusion and sleep disturbance.    PMFS History:  Patient Active Problem List   Diagnosis Date Noted  . Seropositive rheumatoid arthritis ([place]) 03/06/2021  . High risk medication use 03/06/2021  . Joint swelling 02/22/2021  . Osteoarthritis of both knees 02/22/2021  . Generalized osteoarthritis of multiple sites 02/22/2021  . Shoulder tendonitis 02/22/2021  . Diastasis recti 07/24/2020  . Umbilical hernia without obstruction and without gangrene 07/24/2020  . Impingement syndrome of right shoulder 11/02/2019  . PAD (peripheral artery disease) ([place]) 02/10/2019  . Displacement of lumbar intervertebral disc without myelopathy 10/01/2018  . Disorder of adrenal gland ([place]) 06/07/2018  . Adhesive capsulitis of left shoulder 06/02/2018  . Sciatica 05/21/2018  . Impingement syndrome of left shoulder region 10/21/2017  . Bursitis/tendonitis, shoulder 09/16/2017  . 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[place]) 12/15/2016  . Major depressive disorder, single episode, in remission ([place]) 01/24/2015  . Dysmetabolic syndrome [id number]  . Mixed hyperlipidemia 09/05/2014  . Tobacco use disorder 09/05/2014  . Type 2 (non-insulin dependent type) or unspecified type diabetes mellitus with neurological manifestations, uncontrolled 09/05/2014  . Balanoposthitis 01/01/2013    Past Medical History:  Diagnosis Date  . Abdominal hernia   . Arthritis   . Diabetes mellitus without complication ([place])   . Nonhealing surgical wound    nonviable tissue  . PAD (peripheral artery disease) ([facility])     Family History  Problem Relation Age of Onset  . Diabetes Mother   . Heart failure Mother   .  Cancer Father   . Diabetes Brother   . Asthma Son   . Healthy Son   . Healthy Son   . Healthy Son   . Healthy Son    Past Surgical History:  Procedure Laterality Date  . ABDOMINAL AORTOGRAM W/LOWER EXTREMITY N/A 01/31/2019   Procedure: ABDOMINAL AORTOGRAM W/LOWER EXTREMITY;   Surgeon: Waynetta Sandy, MD;  Location: [facility];  Service: Cardiovascular;  Laterality: N/A;  . AMPUTATION Right 02/01/2019   Procedure: AMPUTATION RIGHT FIFTH TOE;  Surgeon: Angelia Mould, MD;  Location: [facility];  Service: Vascular;  Laterality: Right;  . AMPUTATION Right 02/10/2019   Procedure: AMPUTATION RAY RIGHT 5TH TOE WITH DEBRIDEMENT WOUND BED;  Surgeon: Waynetta Sandy, MD;  Location: [facility];  Service: Vascular;  Laterality: Right;  . AMPUTATION TOE Right 02/10/2019   5TH   . PERIPHERAL VASCULAR INTERVENTION Right 01/31/2019   Procedure: PERIPHERAL VASCULAR INTERVENTION;  Surgeon: Waynetta Sandy, MD;  Location: [facility];  Service: Cardiovascular;  Laterality: Right;  SFA  . SHOULDER SURGERY Left    Social History   Social History Narrative  . Not on file   Immunization History  Administered Date(s) Administered  . Moderna Sars-Covid-2 Vaccination 05/05/2020, 06/05/2020, 12/14/2020  . Tdap 04/11/2009     Objective: Vital Signs: BP 121/76 (BP Location: Left Arm, Patient Position: Sitting, Cuff Size: Large)   Pulse 69   Ht '5\' 10"'$  (1.778 m)   Wt 283 lb 4.8 oz (128.5 kg)   BMI 40.65 kg/m    Physical Exam Constitutional:      Appearance: He is obese.  Skin:    General: Skin is warm and dry.     Findings: No rash.  Neurological:     General: No focal deficit present.     Mental Status: He is alert.  Psychiatric:        Mood and Affect: Mood normal.      Musculoskeletal Exam:  Elbows full ROM no tenderness or swelling Wrists full ROM no tenderness or swelling Fingers full ROM no tenderness or swelling, Right 1st MCP mild stiffness and tenderness, 3rd PIP swelling slightly decreased flexion ROM nontender Knees full ROM no tenderness or swelling  CDAI Exam: CDAI Score: 8  Patient Global: 30 mm; Provider Global: 20 mm Swollen: 2 ; Tender: 1  Joint Exam 05/08/2021      Right  Left  MCP 1  Swollen Tender      PIP 3  Swollen         Investigation: No additional findings.  Imaging: No results found.  Recent Labs: Lab Results  Component Value Date   WBC 9.0 04/30/2021   HGB 15.6 04/30/2021   PLT 198 04/30/2021   NA 139 04/30/2021   K 4.1 04/30/2021   CL 104 04/30/2021   CO2 27 04/30/2021   GLUCOSE 133 (H) 04/30/2021   BUN 10 04/30/2021   CREATININE 0.69 (L) 04/30/2021   BILITOT 0.3 04/30/2021   ALKPHOS 51 08/09/2020   AST 20 04/30/2021   ALT 15 04/30/2021   PROT 6.5 04/30/2021   ALBUMIN 4.0 08/09/2020   CALCIUM 9.4 04/30/2021   GFRAA 120 04/30/2021    Speciality Comments: No specialty comments available.  Procedures:  No procedures performed Allergies: Jardiance [empagliflozin] and Sulfa antibiotics   Assessment / Plan:     Visit Diagnoses: Seropositive rheumatoid arthritis ([place]) - Plan: leflunomide (ARAVA) 20 MG tablet  Rheumatoid arthritis there is some evidence of disease activity  with a few swollen joints on exam today but he is not tolerating the methotrexate well so discussed alternative treatment options.  Recommend trial of leflunomide 20 mg p.o. daily discussed this can have a similar side effect profile as methotrexate but we will see if he tolerates it any better.  If not probably have to discuss concerning biologic DMARD he has some hesitation towards side effects especially such as infection.  Recommended we will need to recheck labs again within 4 weeks on new medication change.  High risk medication use  No repeat lab testing for methotrexate toxicity at this time since changing medications.  Orders: No orders of the defined types were placed in this encounter.  Meds ordered this encounter  Medications  . leflunomide (ARAVA) 20 MG tablet    Sig: Take 1 tablet (20 mg total) by mouth daily.    Dispense:  30 tablet    Refill:  0     Follow-Up Instructions: Return in about 4 weeks (around 06/05/2021) for RA LEF start after d/c MTX.   Collier Salina, MD  Note - This record has been created using [employer].  Chart creation errors have been sought, but may not always  have been located. Such creation errors do not reflect on  the standard of medical care.

## 2021-05-11 ENCOUNTER — Other Ambulatory Visit: Payer: Self-pay

## 2021-05-11 ENCOUNTER — Ambulatory Visit
Admission: RE | Admit: 2021-05-11 | Discharge: 2021-05-11 | Disposition: A | Discharge status: Home / Self Care | Payer: Medicaid Other | Source: Ambulatory Visit | Attending: Orthopedic Surgery | Admitting: Orthopedic Surgery

## 2021-05-11 DIAGNOSIS — M7541 Impingement syndrome of right shoulder: Secondary | ICD-10-CM

## 2021-05-11 DIAGNOSIS — Z9889 Other specified postprocedural states: Secondary | ICD-10-CM

## 2021-05-15 DIAGNOSIS — I358 Other nonrheumatic aortic valve disorders: Secondary | ICD-10-CM | POA: Diagnosis not present

## 2021-05-15 DIAGNOSIS — I2510 Atherosclerotic heart disease of native coronary artery without angina pectoris: Secondary | ICD-10-CM | POA: Diagnosis not present

## 2021-05-15 DIAGNOSIS — J439 Emphysema, unspecified: Secondary | ICD-10-CM | POA: Diagnosis not present

## 2021-05-15 DIAGNOSIS — I700 Atherosclerosis of aorta: Secondary | ICD-10-CM | POA: Diagnosis not present

## 2021-05-15 DIAGNOSIS — E279 Disorder of adrenal gland, unspecified: Secondary | ICD-10-CM | POA: Diagnosis not present

## 2021-05-15 DIAGNOSIS — Z87891 Personal history of nicotine dependence: Secondary | ICD-10-CM | POA: Diagnosis not present

## 2021-06-04 NOTE — Progress Notes (Signed)
Office Visit Note  Patient: Derrick Clark             Date of Birth: [birth year]           MRN: [id number]             PCP: Caryl Bis, MD Referring: Caryl Bis, MD Visit Date: 06/05/2021   Subjective:  Follow-up (Per patient he has noticed some improvement in symptoms since starting [place].)   History of Present Illness: Derrick Clark is a 60 y.o. male here for follow up for seropositive rheumatoid arthritis on leflunomide 20 mg p.o. daily.  He he started this medicine 1 month ago after discontinuing methotrexate due to limited joint improvement and side effects including severe fatigue.  He has not noticed any particular problems starting the medicine.  He has had a slight increase in his frequency of bowel movements but previously had constipation so does not causing any problem.  He has tried reducing sugary drink contents and thinks he lost a few pounds related to this.  He has noticed a bit of swelling in the right knee but no significant swelling in his bilateral hands since the last visit.    Review of Systems  Constitutional: Positive for fatigue.  HENT: Negative for mouth sores, mouth dryness and nose dryness.   Eyes: Negative for pain, itching, visual disturbance and dryness.  Respiratory: Negative for cough, hemoptysis, shortness of breath and difficulty breathing.   Cardiovascular: Negative for chest pain, palpitations and swelling in legs/feet.  Gastrointestinal: Negative for abdominal pain, blood in stool, constipation and diarrhea.  Endocrine: Negative for increased urination.  Genitourinary: Negative for painful urination.  Musculoskeletal: Positive for arthralgias, joint pain, joint swelling and morning stiffness. Negative for myalgias, muscle weakness, muscle tenderness and myalgias.  Skin: Negative for color change, rash and redness.  Allergic/Immunologic: Negative for susceptible to infections.  Neurological: Positive for dizziness, headaches, parasthesias,  memory loss and weakness. Negative for numbness.  Hematological: Negative for swollen glands.  Psychiatric/Behavioral: Positive for confusion. Negative for sleep disturbance.    PMFS History:  Patient Active Problem List   Diagnosis Date Noted  . Seropositive rheumatoid arthritis ([place]) 03/06/2021  . High risk medication use 03/06/2021  . Joint swelling 02/22/2021  . Osteoarthritis of both knees 02/22/2021  . Generalized osteoarthritis of multiple sites 02/22/2021  . Shoulder tendonitis 02/22/2021  . Diastasis recti 07/24/2020  . Umbilical hernia without obstruction and without gangrene 07/24/2020  . Impingement syndrome of right shoulder 11/02/2019  . PAD (peripheral artery disease) ([place]) 02/10/2019  . Displacement of lumbar intervertebral disc without myelopathy 10/01/2018  . Disorder of adrenal gland ([place]) 06/07/2018  . Adhesive capsulitis of left shoulder 06/02/2018  . Sciatica 05/21/2018  . Impingement syndrome of left shoulder region 10/21/2017  . Bursitis/tendonitis, shoulder 09/16/2017  . 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[place]) 12/15/2016  . Major depressive disorder, single episode, in remission ([place]) 01/24/2015  . Dysmetabolic syndrome [id number]  . Mixed hyperlipidemia 09/05/2014  . Tobacco use disorder 09/05/2014  . Type 2 (non-insulin dependent type) or unspecified type diabetes mellitus with neurological manifestations, uncontrolled 09/05/2014  . Balanoposthitis 01/01/2013    Past Medical History:  Diagnosis Date  . Abdominal hernia   . Arthritis   . Diabetes mellitus without complication ([place])   . Nonhealing surgical wound    nonviable tissue  . PAD (peripheral artery disease) ([facility])     Family History  Problem Relation Age of Onset  . Diabetes Mother   .  Heart failure Mother   . Cancer Father   . Diabetes Brother   . Asthma Son   . Healthy Son   . Healthy Son   . Healthy Son   . Healthy Son    Past Surgical History:  Procedure Laterality Date  .  ABDOMINAL AORTOGRAM W/LOWER EXTREMITY N/A 01/31/2019   Procedure: ABDOMINAL AORTOGRAM W/LOWER EXTREMITY;  Surgeon: Waynetta Sandy, MD;  Location: [facility];  Service: Cardiovascular;  Laterality: N/A;  . AMPUTATION Right 02/01/2019   Procedure: AMPUTATION RIGHT FIFTH TOE;  Surgeon: Angelia Mould, MD;  Location: [facility];  Service: Vascular;  Laterality: Right;  . AMPUTATION Right 02/10/2019   Procedure: AMPUTATION RAY RIGHT 5TH TOE WITH DEBRIDEMENT WOUND BED;  Surgeon: Waynetta Sandy, MD;  Location: [facility];  Service: Vascular;  Laterality: Right;  . AMPUTATION TOE Right 02/10/2019   5TH   . PERIPHERAL VASCULAR INTERVENTION Right 01/31/2019   Procedure: PERIPHERAL VASCULAR INTERVENTION;  Surgeon: Waynetta Sandy, MD;  Location: [facility];  Service: Cardiovascular;  Laterality: Right;  SFA  . SHOULDER SURGERY Left    Social History   Social History Narrative  . Not on file   Immunization History  Administered Date(s) Administered  . Moderna Sars-Covid-2 Vaccination 05/05/2020, 06/05/2020, 12/14/2020  . Tdap 04/11/2009     Objective: Vital Signs: BP 128/80 (BP Location: Left Arm, Patient Position: Sitting, Cuff Size: Large)   Pulse 81   Ht '5\' 10"'$  (1.778 m)   Wt 280 lb (127 kg)   BMI 40.18 kg/m    Physical Exam Constitutional:      Appearance: He is obese.  Cardiovascular:     Rate and Rhythm: Normal rate and regular rhythm.  Pulmonary:     Effort: Pulmonary effort is normal.     Breath sounds: Normal breath sounds.  Skin:    General: Skin is warm and dry.     Findings: No rash.  Neurological:     General: No focal deficit present.     Mental Status: He is alert.  Psychiatric:        Mood and Affect: Mood normal.     Musculoskeletal Exam:  Elbows full ROM no tenderness or swelling Wrists full ROM no tenderness or swelling Fingers full ROM, bony digit enlargement no palpable synovitis Knees full ROM, mild medial joint  line tenderness to palpation, no effusion, partial lateral deviation of patella bilaterally worse on right Ankles full ROM no tenderness or swelling MTPs negative for squeeze tenderness   CDAI Exam: CDAI Score: 7  Patient Global: 40 mm; Provider Global: 20 mm Swollen: 0 ; Tender: 1  Joint Exam 06/05/2021      Right  Left  Knee   Tender        Investigation: No additional findings.  Imaging: No results found.  Recent Labs: Lab Results  Component Value Date   WBC 9.0 04/30/2021   HGB 15.6 04/30/2021   PLT 198 04/30/2021   NA 139 04/30/2021   K 4.1 04/30/2021   CL 104 04/30/2021   CO2 27 04/30/2021   GLUCOSE 133 (H) 04/30/2021   BUN 10 04/30/2021   CREATININE 0.69 (L) 04/30/2021   BILITOT 0.3 04/30/2021   ALKPHOS 51 08/09/2020   AST 20 04/30/2021   ALT 15 04/30/2021   PROT 6.5 04/30/2021   ALBUMIN 4.0 08/09/2020   CALCIUM 9.4 04/30/2021   GFRAA 120 04/30/2021    Speciality Comments: No specialty comments available.  Procedures:  No procedures performed Allergies: Jardiance [empagliflozin] and Sulfa antibiotics   Assessment / Plan:     Visit Diagnoses: Seropositive rheumatoid arthritis ([place])  Rheumatoid arthritis with joint pain primarily in bilateral hands and knees currently appears to be an low disease activity by clinical assessment no episode of severe flareup since last visit.  Too early to determine clinical response to leflunomide only 4 weeks after starting.  We will continue 20 mg of leflunomide daily plan to reassess in 8 weeks for response and serology at that time.  High risk medication use - Plan: CBC with Differential/Platelet, COMPLETE METABOLIC PANEL WITH GFR  Recent new start of leflunomide 20 mg p.o. daily without loading dose checking CBC and CMP today for drug toxicity monitoring.  Tobacco use disorder  Discussed skin relationship between ongoing cigarette use can contribute disease activity for rheumatoid arthritis although currently  appears to be low activity.  Orders: Orders Placed This Encounter  Procedures  . CBC with Differential/Platelet  . COMPLETE METABOLIC PANEL WITH GFR   No orders of the defined types were placed in this encounter.    Follow-Up Instructions: Return in about 2 months (around 08/05/2021) for Seropositive RA on LEF 8wks f/u.   Collier Salina, MD  Note - This record has been created using [employer].  Chart creation errors have been sought, but may not always  have been located. Such creation errors do not reflect on  the standard of medical care.

## 2021-06-05 ENCOUNTER — Ambulatory Visit: Payer: Medicaid Other | Admitting: Internal Medicine

## 2021-06-05 ENCOUNTER — Encounter: Payer: Self-pay | Admitting: Internal Medicine

## 2021-06-05 ENCOUNTER — Other Ambulatory Visit: Payer: Self-pay

## 2021-06-05 VITALS — BP 128/80 | HR 81 | Ht 70.0 in | Wt 280.0 lb

## 2021-06-05 DIAGNOSIS — Z79899 Other long term (current) drug therapy: Secondary | ICD-10-CM

## 2021-06-05 DIAGNOSIS — M059 Rheumatoid arthritis with rheumatoid factor, unspecified: Secondary | ICD-10-CM | POA: Diagnosis not present

## 2021-06-05 DIAGNOSIS — F17200 Nicotine dependence, unspecified, uncomplicated: Secondary | ICD-10-CM

## 2021-06-05 MED ORDER — LEFLUNOMIDE 20 MG PO TABS
20.0000 mg | ORAL_TABLET | Freq: Every day | ORAL | 1 refills | Status: DC
Start: 2021-06-05 — End: 2021-07-19

## 2021-06-06 LAB — CBC WITH DIFFERENTIAL/PLATELET
Absolute Monocytes: 896 cells/uL (ref 200–950)
Basophils Absolute: 61 cells/uL (ref 0–200)
Basophils Relative: 0.7 %
Eosinophils Absolute: 191 cells/uL (ref 15–500)
Eosinophils Relative: 2.2 %
HCT: 50.4 % — ABNORMAL HIGH (ref 38.5–50.0)
Hemoglobin: 17.2 g/dL — ABNORMAL HIGH (ref 13.2–17.1)
Lymphs Abs: 2314 cells/uL (ref 850–3900)
MCH: 31.7 pg (ref 27.0–33.0)
MCHC: 34.1 g/dL (ref 32.0–36.0)
MCV: 93 fL (ref 80.0–100.0)
MPV: 10.7 fL (ref 7.5–12.5)
Monocytes Relative: 10.3 %
Neutro Abs: 5237 cells/uL (ref 1500–7800)
Neutrophils Relative %: 60.2 %
Platelets: 179 10*3/uL (ref 140–400)
RBC: 5.42 10*6/uL (ref 4.20–5.80)
RDW: 12.8 % (ref 11.0–15.0)
Total Lymphocyte: 26.6 %
WBC: 8.7 10*3/uL (ref 3.8–10.8)

## 2021-06-06 LAB — COMPLETE METABOLIC PANEL WITH GFR
AG Ratio: 1.6 (calc) (ref 1.0–2.5)
ALT: 19 U/L (ref 9–46)
AST: 24 U/L (ref 10–35)
Albumin: 4.4 g/dL (ref 3.6–5.1)
Alkaline phosphatase (APISO): 59 U/L (ref 35–144)
BUN: 11 mg/dL (ref 7–25)
CO2: 27 mmol/L (ref 20–32)
Calcium: 9.7 mg/dL (ref 8.6–10.3)
Chloride: 104 mmol/L (ref 98–110)
Creat: 0.73 mg/dL (ref 0.70–1.25)
GFR, Est African American: 117 mL/min/{1.73_m2} (ref >=60)
GFR, Est Non African American: 101 mL/min/{1.73_m2} (ref >=60)
Globulin: 2.8 g/dL (calc) (ref 1.9–3.7)
Glucose, Bld: 102 mg/dL — ABNORMAL HIGH (ref 65–99)
Potassium: 4.6 mmol/L (ref 3.5–5.3)
Sodium: 139 mmol/L (ref 135–146)
Total Bilirubin: 0.6 mg/dL (ref 0.2–1.2)
Total Protein: 7.2 g/dL (ref 6.1–8.1)

## 2021-06-09 NOTE — Progress Notes (Signed)
Blood tests look fine no evidence of complications from the arava.

## 2021-06-10 NOTE — Progress Notes (Signed)
Spoke with patient, advised blood tests look fine no evidence of complications from the arava.

## 2021-06-18 DIAGNOSIS — E782 Mixed hyperlipidemia: Secondary | ICD-10-CM | POA: Diagnosis not present

## 2021-06-18 DIAGNOSIS — E1165 Type 2 diabetes mellitus with hyperglycemia: Secondary | ICD-10-CM | POA: Diagnosis not present

## 2021-06-18 DIAGNOSIS — E7849 Other hyperlipidemia: Secondary | ICD-10-CM | POA: Diagnosis not present

## 2021-06-18 DIAGNOSIS — E8881 Metabolic syndrome: Secondary | ICD-10-CM | POA: Diagnosis not present

## 2021-06-21 DIAGNOSIS — Z7189 Other specified counseling: Secondary | ICD-10-CM | POA: Diagnosis not present

## 2021-06-21 DIAGNOSIS — Z608 Other problems related to social environment: Secondary | ICD-10-CM | POA: Diagnosis not present

## 2021-06-21 DIAGNOSIS — E7849 Other hyperlipidemia: Secondary | ICD-10-CM | POA: Diagnosis not present

## 2021-06-21 DIAGNOSIS — E1142 Type 2 diabetes mellitus with diabetic polyneuropathy: Secondary | ICD-10-CM | POA: Diagnosis not present

## 2021-06-21 DIAGNOSIS — L219 Seborrheic dermatitis, unspecified: Secondary | ICD-10-CM | POA: Diagnosis not present

## 2021-06-21 DIAGNOSIS — J441 Chronic obstructive pulmonary disease with (acute) exacerbation: Secondary | ICD-10-CM | POA: Diagnosis not present

## 2021-06-21 DIAGNOSIS — E8881 Metabolic syndrome: Secondary | ICD-10-CM | POA: Diagnosis not present

## 2021-06-21 DIAGNOSIS — E1165 Type 2 diabetes mellitus with hyperglycemia: Secondary | ICD-10-CM | POA: Diagnosis not present

## 2021-07-19 ENCOUNTER — Other Ambulatory Visit: Payer: Self-pay

## 2021-07-19 DIAGNOSIS — M059 Rheumatoid arthritis with rheumatoid factor, unspecified: Secondary | ICD-10-CM

## 2021-07-19 MED ORDER — LEFLUNOMIDE 20 MG PO TABS: 20.0000 mg | ORAL_TABLET | Freq: Every day | ORAL | 1 refills | Status: DC

## 2021-07-19 NOTE — Telephone Encounter (Signed)
Patient called requesting prescription refill of Leflunomide to be sent to [facility] in [place].

## 2021-07-19 NOTE — Telephone Encounter (Signed)
Next Visit: 08/12/2021 Last Visit: 06/05/2021  Last Fill: 06/05/2021  DX: Seropositive rheumatoid arthritis  Current Dose per office note 06/05/2021:  20 mg of leflunomide daily  Labs: CMP, CBC 06/05/2021  Okay to refill Arava?

## 2021-08-11 NOTE — Progress Notes (Signed)
Office Visit Note  Patient: Derrick Clark             Date of Birth: [birth year]           MRN: [id number]             PCP: Caryl Bis, MD Referring: Caryl Bis, MD Visit Date: 08/12/2021   Subjective:  Follow-up (Patient is taking Arava 20 mg daily. Patient complains of bilateral ankle pain and low back pain. )   History of Present Illness: Derrick Clark is a 60 y.o. male here for follow up for seropositive RA on leflunomide 20 mg PO daily. Since our last visit he has had joint pain mostly in the low back and bilateral ankles. Right is slightly worse, particularly around medial malleolus no associated swelling or erythema and he reports this is similar to chronic symptoms at that area. His back pain has also been present for years, previously treated with injection in [facility] in 2019 never finished the series for this. He has pain radiating down the leg sometimes no current complaint.  Previous HPI: 06/05/21 Derrick Clark is a 60 y.o. male here for follow up for seropositive rheumatoid arthritis on leflunomide 20 mg p.o. daily.  He he started this medicine 1 month ago after discontinuing methotrexate due to limited joint improvement and side effects including severe fatigue.  He has not noticed any particular problems starting the medicine.  He has had a slight increase in his frequency of bowel movements but previously had constipation so does not causing any problem.  He has tried reducing sugary drink contents and thinks he lost a few pounds related to this.  He has noticed a bit of swelling in the right knee but no significant swelling in his bilateral hands since the last visit.   Review of Systems  Constitutional:  Positive for fatigue.  HENT:  Positive for mouth dryness. Negative for mouth sores and nose dryness.   Eyes:  Positive for itching and visual disturbance. Negative for pain and dryness.  Respiratory:  Negative for cough, hemoptysis, shortness of breath and  difficulty breathing.   Cardiovascular:  Negative for chest pain, palpitations and swelling in legs/feet.  Gastrointestinal:  Negative for abdominal pain, blood in stool, constipation and diarrhea.  Endocrine: Negative for increased urination.  Genitourinary:  Negative for painful urination.  Musculoskeletal:  Positive for joint pain and joint pain. Negative for joint swelling, myalgias, muscle weakness, morning stiffness, muscle tenderness and myalgias.  Skin:  Negative for color change, rash and redness.  Allergic/Immunologic: Negative for susceptible to infections.  Neurological:  Positive for dizziness, headaches, parasthesias and memory loss. Negative for numbness and weakness.  Hematological:  Negative for swollen glands.  Psychiatric/Behavioral:  Positive for confusion and sleep disturbance.    PMFS History:  Patient Active Problem List   Diagnosis Date Noted   Seropositive rheumatoid arthritis ([place]) 03/06/2021   High risk medication use 03/06/2021   Joint swelling 02/22/2021   Osteoarthritis of both knees 02/22/2021   Generalized osteoarthritis of multiple sites 02/22/2021   Shoulder tendonitis 02/22/2021   Diastasis recti [id number]   Umbilical hernia without obstruction and without gangrene 07/24/2020   Impingement syndrome of right shoulder 11/02/2019   PAD (peripheral artery disease) ([place]) 02/10/2019   Displacement of lumbar intervertebral disc without myelopathy 10/01/2018   Disorder of adrenal gland ([place]) 06/07/2018   Adhesive capsulitis of left shoulder 06/02/2018   Sciatica 05/21/2018   Impingement syndrome of left  shoulder region 10/21/2017   Bursitis/tendonitis, shoulder 09/16/2017   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[place]) 12/15/2016   Major depressive disorder, single episode, in remission ([place]) [id number]   Dysmetabolic syndrome X [id number]   Mixed hyperlipidemia 09/05/2014   Tobacco use disorder 09/05/2014   Type 2 (non-insulin dependent type) or unspecified  type diabetes mellitus with neurological manifestations, uncontrolled 09/05/2014   Balanoposthitis 01/01/2013    Past Medical History:  Diagnosis Date   Abdominal hernia    Arthritis    Diabetes mellitus without complication (HCC)    Nonhealing surgical wound    nonviable tissue   PAD (peripheral artery disease) ([place])     Family History  Problem Relation Age of Onset   Diabetes Mother    Heart failure Mother    Cancer Father    Diabetes Brother    Asthma Son    Healthy Son    Healthy Son    Healthy Son    Healthy Son    Past Surgical History:  Procedure Laterality Date   ABDOMINAL AORTOGRAM W/LOWER EXTREMITY N/A 01/31/2019   Procedure: ABDOMINAL AORTOGRAM W/LOWER EXTREMITY;  Surgeon: Waynetta Sandy, MD;  Location: [facility];  Service: Cardiovascular;  Laterality: N/A;   AMPUTATION Right 02/01/2019   Procedure: AMPUTATION RIGHT FIFTH TOE;  Surgeon: Angelia Mould, MD;  Location: [facility];  Service: Vascular;  Laterality: Right;   AMPUTATION Right 02/10/2019   Procedure: AMPUTATION RAY RIGHT 5TH TOE WITH DEBRIDEMENT WOUND BED;  Surgeon: Waynetta Sandy, MD;  Location: [facility];  Service: Vascular;  Laterality: Right;   AMPUTATION TOE Right 02/10/2019   5TH    PERIPHERAL VASCULAR INTERVENTION Right 01/31/2019   Procedure: PERIPHERAL VASCULAR INTERVENTION;  Surgeon: Waynetta Sandy, MD;  Location: [facility];  Service: Cardiovascular;  Laterality: Right;  SFA   SHOULDER SURGERY Left    Social History   Social History Narrative   Not on file   Immunization History  Administered Date(s) Administered   Moderna Sars-Covid-2 Vaccination 05/05/2020, 06/05/2020, 12/14/2020   Tdap 04/11/2009     Objective: Vital Signs: BP 126/77 (BP Location: Left Arm, Patient Position: Sitting, Cuff Size: Large)   Pulse 74   Ht '5\' 10"'$  (1.778 m)   Wt 271 lb 12.8 oz (123.3 kg)   BMI 39.00 kg/m    Physical Exam Constitutional:      Appearance: He  is obese.  Skin:    General: Skin is warm and dry.     Comments: Chronic residual hyperpigmentation over bilateral ankles, extensive torturous superficial veins on bilateral feet no lesions no ulceration no erythema  Neurological:     Mental Status: He is alert.  Psychiatric:        Mood and Affect: Mood normal.     Musculoskeletal Exam:  Shoulders full ROM no tenderness or swelling Elbows full ROM no tenderness or swelling Wrists full ROM no tenderness or swelling Fingers full ROM no tenderness or swelling Tenderness to palpation over the lumbar spine and bilateral paraspinal muscle tenderness Knees full ROM with patellofemoral crepitus no tenderness or effusions Ankles slightly decreased inversion and eversion range of motion, tenderness to palpation just distal and posterior to the medial malleolus right worse than left, limited ultrasound inspection does not reveal any apparent joint effusion or color Doppler enhancement   CDAI Exam: CDAI Score: -- Patient Global: --; Provider Global: -- Swollen: 0 ; Tender: 3  Joint Exam 08/12/2021      Right  Left  Lumbar  Spine   Tender     Subtalar   Tender   Tender     Investigation: No additional findings.  Imaging: No results found.  Recent Labs: Lab Results  Component Value Date   WBC 8.7 06/05/2021   HGB 17.2 (H) 06/05/2021   PLT 179 06/05/2021   NA 139 06/05/2021   K 4.6 06/05/2021   CL 104 06/05/2021   CO2 27 06/05/2021   GLUCOSE 102 (H) 06/05/2021   BUN 11 06/05/2021   CREATININE 0.73 06/05/2021   BILITOT 0.6 06/05/2021   ALKPHOS 51 08/09/2020   AST 24 06/05/2021   ALT 19 06/05/2021   PROT 7.2 06/05/2021   ALBUMIN 4.0 08/09/2020   CALCIUM 9.7 06/05/2021   GFRAA 117 06/05/2021    Speciality Comments: No specialty comments available.  Procedures:  No procedures performed Allergies: Jardiance [empagliflozin] and Sulfa antibiotics   Assessment / Plan:     Visit Diagnoses: Seropositive rheumatoid arthritis  ([place]) - Plan: Sedimentation rate  RA symptoms appear improved there is no active synovitis in the hands or knees which were previously painful and swollen earlier this year.  The low back pain and bilateral ankle pain seem more related to chronic injuries and degenerative joint change rather than RA.  Plan to continue leflunomide 20 mg p.o. daily.  Checking sedimentation rate today for disease activity monitoring.  Displacement of lumbar intervertebral disc without myelopathy - Plan: Ambulatory referral to Neurosurgery  Chronic low back pain he reports prior are what sounds like MRI indicating myelopathy with either foraminal or spinal stenosis previously injection treated.  He does not have a current orthopedist or neurosurgeon for this problem will refer to local office in [place].  It looks like his prior physician was with [facility] neurosurgery not sure if they have other partners in the practice that would be first choice with easier sharing of records.  High risk medication use - Plan: CBC with Differential/Platelet, COMPLETE METABOLIC PANEL WITH GFR  Need for leflunomide toxicity monitoring checking CBC and CMP today. He denies any symptoms of drug intolerance.  Orders: Orders Placed This Encounter  Procedures   Sedimentation rate   CBC with Differential/Platelet   COMPLETE METABOLIC PANEL WITH GFR   Ambulatory referral to Neurosurgery    No orders of the defined types were placed in this encounter.    Follow-Up Instructions: Return in about 3 months (around 11/12/2021) for RA on LEF f/u 34mo.   CCollier Salina MD  Note - This record has been created using [employer]  Chart creation errors have been sought, but may not always  have been located. Such creation errors do not reflect on  the standard of medical care.

## 2021-08-12 ENCOUNTER — Encounter: Payer: Self-pay | Admitting: Internal Medicine

## 2021-08-12 ENCOUNTER — Ambulatory Visit: Payer: Medicaid Other | Admitting: Internal Medicine

## 2021-08-12 ENCOUNTER — Other Ambulatory Visit: Payer: Self-pay

## 2021-08-12 VITALS — BP 126/77 | HR 74 | Ht 70.0 in | Wt 271.8 lb

## 2021-08-12 DIAGNOSIS — M5126 Other intervertebral disc displacement, lumbar region: Secondary | ICD-10-CM | POA: Diagnosis not present

## 2021-08-12 DIAGNOSIS — Z79899 Other long term (current) drug therapy: Secondary | ICD-10-CM | POA: Diagnosis not present

## 2021-08-12 DIAGNOSIS — M059 Rheumatoid arthritis with rheumatoid factor, unspecified: Secondary | ICD-10-CM | POA: Diagnosis not present

## 2021-08-13 LAB — CBC WITH DIFFERENTIAL/PLATELET
Absolute Monocytes: 945 cells/uL (ref 200–950)
Basophils Absolute: 63 cells/uL (ref 0–200)
Basophils Relative: 0.6 %
Eosinophils Absolute: 200 cells/uL (ref 15–500)
Eosinophils Relative: 1.9 %
HCT: 51.1 % — ABNORMAL HIGH (ref 38.5–50.0)
Hemoglobin: 17.3 g/dL — ABNORMAL HIGH (ref 13.2–17.1)
Lymphs Abs: 1901 cells/uL (ref 850–3900)
MCH: 31.3 pg (ref 27.0–33.0)
MCHC: 33.9 g/dL (ref 32.0–36.0)
MCV: 92.4 fL (ref 80.0–100.0)
MPV: 11 fL (ref 7.5–12.5)
Monocytes Relative: 9 %
Neutro Abs: 7392 cells/uL (ref 1500–7800)
Neutrophils Relative %: 70.4 %
Platelets: 186 10*3/uL (ref 140–400)
RBC: 5.53 10*6/uL (ref 4.20–5.80)
RDW: 11.9 % (ref 11.0–15.0)
Total Lymphocyte: 18.1 %
WBC: 10.5 10*3/uL (ref 3.8–10.8)

## 2021-08-13 LAB — SEDIMENTATION RATE: Sed Rate: 9 mm/h (ref 0–20)

## 2021-08-13 LAB — COMPLETE METABOLIC PANEL WITH GFR
AG Ratio: 1.5 (calc) (ref 1.0–2.5)
ALT: 28 U/L (ref 9–46)
AST: 28 U/L (ref 10–35)
Albumin: 4.3 g/dL (ref 3.6–5.1)
Alkaline phosphatase (APISO): 61 U/L (ref 35–144)
BUN/Creatinine Ratio: 19 (calc) (ref 6–22)
BUN: 11 mg/dL (ref 7–25)
CO2: 25 mmol/L (ref 20–32)
Calcium: 9.5 mg/dL (ref 8.6–10.3)
Chloride: 105 mmol/L (ref 98–110)
Creat: 0.58 mg/dL — ABNORMAL LOW (ref 0.70–1.35)
Globulin: 2.8 g/dL (calc) (ref 1.9–3.7)
Glucose, Bld: 121 mg/dL — ABNORMAL HIGH (ref 65–99)
Potassium: 4.3 mmol/L (ref 3.5–5.3)
Sodium: 140 mmol/L (ref 135–146)
Total Bilirubin: 0.5 mg/dL (ref 0.2–1.2)
Total Protein: 7.1 g/dL (ref 6.1–8.1)
eGFR: 112 mL/min/{1.73_m2} (ref >=60)

## 2021-08-13 NOTE — Progress Notes (Signed)
Labs show no problem with continuing the leflunomide and inflammation markers are normal.

## 2021-10-23 ENCOUNTER — Other Ambulatory Visit: Payer: Self-pay | Admitting: Internal Medicine

## 2021-10-23 DIAGNOSIS — M059 Rheumatoid arthritis with rheumatoid factor, unspecified: Secondary | ICD-10-CM

## 2021-11-11 NOTE — Progress Notes (Deleted)
Office Visit Note  Patient: Derrick Clark             Date of Birth: January 08, 1961           MRN: [id number]             PCP: Caryl Bis, MD Referring: Caryl Bis, MD Visit Date: 11/12/2021   Subjective:  No chief complaint on file.   History of Present Illness: Derrick Clark is a 60 y.o. male here for follow up for seropositive RA on leflunomide 20 mg PO daily. At last visit he had ongoing joint pain without evidence of inflammation on exam or sedimentation rate. He has done some work with physical therapy for neck exercise recommendations.***   Previous HPI 08/12/21 Derrick Clark is a 60 y.o. male here for follow up for seropositive RA on leflunomide 20 mg PO daily. Since our last visit he has had joint pain mostly in the low back and bilateral ankles. Right is slightly worse, particularly around medial malleolus no associated swelling or erythema and he reports this is similar to chronic symptoms at that area. His back pain has also been present for years, previously treated with injection in [facility] in 2019 never finished the series for this. He has pain radiating down the leg sometimes no current complaint.   Previous HPI: 06/05/21 Derrick Clark is a 60 y.o. male here for follow up for seropositive rheumatoid arthritis on leflunomide 20 mg p.o. daily.  He he started this medicine 1 month ago after discontinuing methotrexate due to limited joint improvement and side effects including severe fatigue.  He has not noticed any particular problems starting the medicine.  He has had a slight increase in his frequency of bowel movements but previously had constipation so does not causing any problem.  He has tried reducing sugary drink contents and thinks he lost a few pounds related to this.  He has noticed a bit of swelling in the right knee but no significant swelling in his bilateral hands since the last visit.   No Rheumatology ROS completed.   PMFS History:  Patient Active  Problem List   Diagnosis Date Noted   Seropositive rheumatoid arthritis ([place]) 03/06/2021   High risk medication use 03/06/2021   Joint swelling 02/22/2021   Osteoarthritis of both knees 02/22/2021   Generalized osteoarthritis of multiple sites 02/22/2021   Shoulder tendonitis 02/22/2021   Diastasis recti 70/26/3785   Umbilical hernia without obstruction and without gangrene 07/24/2020   Impingement syndrome of right shoulder 11/02/2019   PAD (peripheral artery disease) ([place]) 02/10/2019   Displacement of lumbar intervertebral disc without myelopathy 10/01/2018   Disorder of adrenal gland ([place]) 06/07/2018   Adhesive capsulitis of left shoulder 06/02/2018   Sciatica 05/21/2018   Impingement syndrome of left shoulder region 10/21/2017   Bursitis/tendonitis, shoulder 09/16/2017   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[place]) 12/15/2016   Major depressive disorder, single episode, in remission ([place]) 88/50/2774   Dysmetabolic syndrome X 12/87/8676   Mixed hyperlipidemia 09/05/2014   Tobacco use disorder 09/05/2014   Type 2 (non-insulin dependent type) or unspecified type diabetes mellitus with neurological manifestations, uncontrolled 09/05/2014   Balanoposthitis 01/01/2013    Past Medical History:  Diagnosis Date   Abdominal hernia    Arthritis    Diabetes mellitus without complication ([place])    Nonhealing surgical wound    nonviable tissue   PAD (peripheral artery disease) ([place])     Family History  Problem Relation  Age of Onset   Diabetes Mother    Heart failure Mother    Cancer Father    Diabetes Brother    Asthma Son    Healthy Son    Healthy Son    Healthy Son    Healthy Son    Past Surgical History:  Procedure Laterality Date   ABDOMINAL AORTOGRAM W/LOWER EXTREMITY N/A 01/31/2019   Procedure: ABDOMINAL AORTOGRAM W/LOWER EXTREMITY;  Surgeon: Waynetta Sandy, MD;  Location: [facility];  Service: Cardiovascular;  Laterality: N/A;   AMPUTATION Right 02/01/2019    Procedure: AMPUTATION RIGHT FIFTH TOE;  Surgeon: Angelia Mould, MD;  Location: [facility];  Service: Vascular;  Laterality: Right;   AMPUTATION Right 02/10/2019   Procedure: AMPUTATION RAY RIGHT 5TH TOE WITH DEBRIDEMENT WOUND BED;  Surgeon: Waynetta Sandy, MD;  Location: [facility];  Service: Vascular;  Laterality: Right;   AMPUTATION TOE Right 02/10/2019   5TH    PERIPHERAL VASCULAR INTERVENTION Right 01/31/2019   Procedure: PERIPHERAL VASCULAR INTERVENTION;  Surgeon: Waynetta Sandy, MD;  Location: [facility];  Service: Cardiovascular;  Laterality: Right;  SFA   SHOULDER SURGERY Left    Social History   Social History Narrative   Not on file   Immunization History  Administered Date(s) Administered   Moderna Sars-Covid-2 Vaccination 05/05/2020, 06/05/2020, 12/14/2020   Tdap 04/11/2009     Objective: Vital Signs: There were no vitals taken for this visit.   Physical Exam   Musculoskeletal Exam: ***  CDAI Exam: CDAI Score: -- Patient Global: --; Provider Global: -- Swollen: --; Tender: -- Joint Exam 11/12/2021   No joint exam has been documented for this visit   There is currently no information documented on the homunculus. Go to the Rheumatology activity and complete the homunculus joint exam.  Investigation: No additional findings.  Imaging: No results found.  Recent Labs: Lab Results  Component Value Date   WBC 10.5 08/12/2021   HGB 17.3 (H) 08/12/2021   PLT 186 08/12/2021   NA 140 08/12/2021   K 4.3 08/12/2021   CL 105 08/12/2021   CO2 25 08/12/2021   GLUCOSE 121 (H) 08/12/2021   BUN 11 08/12/2021   CREATININE 0.58 (L) 08/12/2021   BILITOT 0.5 08/12/2021   ALKPHOS 51 08/09/2020   AST 28 08/12/2021   ALT 28 08/12/2021   PROT 7.1 08/12/2021   ALBUMIN 4.0 08/09/2020   CALCIUM 9.5 08/12/2021   GFRAA 117 06/05/2021    Speciality Comments: No specialty comments available.  Procedures:  No procedures performed Allergies:  Jardiance [empagliflozin] and Sulfa antibiotics   Assessment / Plan:     Visit Diagnoses: No diagnosis found.  ***  Orders: No orders of the defined types were placed in this encounter.  No orders of the defined types were placed in this encounter.    Follow-Up Instructions: No follow-ups on file.   Collier Salina, MD  Note - This record has been created using [employer].  Chart creation errors have been sought, but may not always  have been located. Such creation errors do not reflect on  the standard of medical care.

## 2021-11-12 ENCOUNTER — Ambulatory Visit: Payer: Medicaid Other | Admitting: Internal Medicine

## 2021-11-19 ENCOUNTER — Encounter (INDEPENDENT_AMBULATORY_CARE_PROVIDER_SITE_OTHER): Payer: Self-pay | Admitting: *Deleted

## 2021-11-19 ENCOUNTER — Other Ambulatory Visit: Payer: Self-pay | Admitting: Internal Medicine

## 2021-11-19 DIAGNOSIS — M059 Rheumatoid arthritis with rheumatoid factor, unspecified: Secondary | ICD-10-CM

## 2021-11-24 NOTE — Progress Notes (Deleted)
Office Visit Note  Patient: Derrick Clark             Date of Birth: [birth year]           MRN: [id number]             PCP: Caryl Bis, MD Referring: Caryl Bis, MD Visit Date: 11/25/2021   Subjective:  No chief complaint on file.   History of Present Illness: Derrick Clark is a 60 y.o. male here for follow up for seropositive RA on leflunomide 20 mg daily. Since our last visit he saw PT for cervical spine arthritis symptoms. ***   Previous HPI 08/12/21 ABDIKADIR Clark is a 60 y.o. male here for follow up for seropositive RA on leflunomide 20 mg PO daily. Since our last visit he has had joint pain mostly in the low back and bilateral ankles. Right is slightly worse, particularly around medial malleolus no associated swelling or erythema and he reports this is similar to chronic symptoms at that area. His back pain has also been present for years, previously treated with injection in [facility] in 2019 never finished the series for this. He has pain radiating down the leg sometimes no current complaint.   Previous HPI: 06/05/21 Derrick Clark is a 60 y.o. male here for follow up for seropositive rheumatoid arthritis on leflunomide 20 mg p.o. daily.  He he started this medicine 1 month ago after discontinuing methotrexate due to limited joint improvement and side effects including severe fatigue.  He has not noticed any particular problems starting the medicine.  He has had a slight increase in his frequency of bowel movements but previously had constipation so does not causing any problem.  He has tried reducing sugary drink contents and thinks he lost a few pounds related to this.  He has noticed a bit of swelling in the right knee but no significant swelling in his bilateral hands since the last visit.   No Rheumatology ROS completed.   PMFS History:  Patient Active Problem List   Diagnosis Date Noted   Seropositive rheumatoid arthritis ([place]) 03/06/2021   High risk medication use  03/06/2021   Joint swelling 02/22/2021   Osteoarthritis of both knees 02/22/2021   Generalized osteoarthritis of multiple sites 02/22/2021   Shoulder tendonitis 02/22/2021   Diastasis recti 61/95/0932   Umbilical hernia without obstruction and without gangrene 07/24/2020   Impingement syndrome of right shoulder 11/02/2019   PAD (peripheral artery disease) ([place]) 02/10/2019   Displacement of lumbar intervertebral disc without myelopathy 10/01/2018   Disorder of adrenal gland ([place]) 06/07/2018   Adhesive capsulitis of left shoulder 06/02/2018   Sciatica 05/21/2018   Impingement syndrome of left shoulder region 10/21/2017   Bursitis/tendonitis, shoulder 09/16/2017   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[place]) 12/15/2016   Major depressive disorder, single episode, in remission ([place]) 67/11/4579   Dysmetabolic syndrome X 99/83/3825   Mixed hyperlipidemia 09/05/2014   Tobacco use disorder 09/05/2014   Type 2 (non-insulin dependent type) or unspecified type diabetes mellitus with neurological manifestations, uncontrolled 09/05/2014   Balanoposthitis 01/01/2013    Past Medical History:  Diagnosis Date   Abdominal hernia    Arthritis    Diabetes mellitus without complication ([place])    Nonhealing surgical wound    nonviable tissue   PAD (peripheral artery disease) ([place])     Family History  Problem Relation Age of Onset   Diabetes Mother    Heart failure Mother    Cancer  Father    Diabetes Brother    Asthma Son    Healthy Son    Healthy Son    Healthy Son    Healthy Son    Past Surgical History:  Procedure Laterality Date   ABDOMINAL AORTOGRAM W/LOWER EXTREMITY N/A 01/31/2019   Procedure: ABDOMINAL AORTOGRAM W/LOWER EXTREMITY;  Surgeon: Waynetta Sandy, MD;  Location: [facility];  Service: Cardiovascular;  Laterality: N/A;   AMPUTATION Right 02/01/2019   Procedure: AMPUTATION RIGHT FIFTH TOE;  Surgeon: Angelia Mould, MD;  Location: [facility];  Service: Vascular;   Laterality: Right;   AMPUTATION Right 02/10/2019   Procedure: AMPUTATION RAY RIGHT 5TH TOE WITH DEBRIDEMENT WOUND BED;  Surgeon: Waynetta Sandy, MD;  Location: [facility];  Service: Vascular;  Laterality: Right;   AMPUTATION TOE Right 02/10/2019   5TH    PERIPHERAL VASCULAR INTERVENTION Right 01/31/2019   Procedure: PERIPHERAL VASCULAR INTERVENTION;  Surgeon: Waynetta Sandy, MD;  Location: [facility];  Service: Cardiovascular;  Laterality: Right;  SFA   SHOULDER SURGERY Left    Social History   Social History Narrative   Not on file   Immunization History  Administered Date(s) Administered   Moderna Sars-Covid-2 Vaccination 05/05/2020, 06/05/2020, 12/14/2020   Tdap 04/11/2009     Objective: Vital Signs: There were no vitals taken for this visit.   Physical Exam   Musculoskeletal Exam: ***  CDAI Exam: CDAI Score: -- Patient Global: --; Provider Global: -- Swollen: --; Tender: -- Joint Exam 11/25/2021   No joint exam has been documented for this visit   There is currently no information documented on the homunculus. Go to the Rheumatology activity and complete the homunculus joint exam.  Investigation: No additional findings.  Imaging: No results found.  Recent Labs: Lab Results  Component Value Date   WBC 10.5 08/12/2021   HGB 17.3 (H) 08/12/2021   PLT 186 08/12/2021   NA 140 08/12/2021   K 4.3 08/12/2021   CL 105 08/12/2021   CO2 25 08/12/2021   GLUCOSE 121 (H) 08/12/2021   BUN 11 08/12/2021   CREATININE 0.58 (L) 08/12/2021   BILITOT 0.5 08/12/2021   ALKPHOS 51 08/09/2020   AST 28 08/12/2021   ALT 28 08/12/2021   PROT 7.1 08/12/2021   ALBUMIN 4.0 08/09/2020   CALCIUM 9.5 08/12/2021   GFRAA 117 06/05/2021    Speciality Comments: No specialty comments available.  Procedures:  No procedures performed Allergies: Jardiance [empagliflozin] and Sulfa antibiotics   Assessment / Plan:     Visit Diagnoses: No diagnosis  found.  ***  Orders: No orders of the defined types were placed in this encounter.  No orders of the defined types were placed in this encounter.    Follow-Up Instructions: No follow-ups on file.   Collier Salina, MD  Note - This record has been created using [employer].  Chart creation errors have been sought, but may not always  have been located. Such creation errors do not reflect on  the standard of medical care.

## 2021-11-25 ENCOUNTER — Ambulatory Visit: Payer: Medicaid Other | Admitting: Internal Medicine

## 2021-12-31 ENCOUNTER — Other Ambulatory Visit: Payer: Self-pay | Admitting: Internal Medicine

## 2021-12-31 DIAGNOSIS — M059 Rheumatoid arthritis with rheumatoid factor, unspecified: Secondary | ICD-10-CM

## 2022-01-01 NOTE — Telephone Encounter (Signed)
Derrick Clark needs to reschedule a follow up visit or at least updated CBC and CMP before we can refill leflunomide.

## 2022-01-05 ENCOUNTER — Other Ambulatory Visit: Payer: Self-pay | Admitting: Internal Medicine

## 2022-01-05 DIAGNOSIS — M059 Rheumatoid arthritis with rheumatoid factor, unspecified: Secondary | ICD-10-CM

## 2022-01-06 NOTE — Telephone Encounter (Signed)
Mr. Dimartino needs to schedule a clinic follow up or at least updated CBC and CMP before we can safely refill leflunomide.

## 2022-01-28 NOTE — H&P (Signed)
°  Patient: Derrick Clark  PID: [id number]  DOB: Jul 17, 1961  SEX: Male   Patient referred by DDS for extraction all remaining teeth  CC: Painful teeth. Keep breaking off.  Past Medical History:  Hay Fever or Sinus Problems, Snoring, Bruise Easily, Diabetes, Rheumatoid Arthritis, Peripheral Artery Disease, Obese    Medications: Leflunomide, Bydureon, Simvastatin, Metformin, celecoxib, Farxiga, Clopidogrel, Symbicort, Azithromycin, Doxycycline    Allergies:     Sulfa    Surgeries:   Stent placed right leg 2019, shoulder surgery, Toe Amputation     Social History       Smoking:  1 ppd          Alcohol: n Drug use: n                             Exam: BMI 36. Multiple carious teeth/tooth fragments # 4, 5, 8, 10, 11, 14, 15, 17, 20-29, 32, Bilateral lingual tori.  No purulence, edema, fluctuance, trismus. Oral cancer screening negative. Pharynx clear. No lymphadenopathy.  Panorex:Multiple carious teeth/tooth fragments # 4, 5, 8, 10, 11, 14, 15, 17, 20-29, 32,   Assessment: ASA 3. Non-restorable  teeth # 4, 5, 8, 10, 11, 14, 15, 17, 20-29, 32,              Plan: 1. MD Clearance  2. Extraction Teeth # 4, 5, 8, 10, 11, 14, 15, 17, 20, 21, 22, 23, 24, 25, 26, 27, 28, 29, 32, Alveoloplasty, removal lingual tori   Hospital Day surgery.                 Rx: n               Risks and complications explained. Questions answered.   Gae Bon, DMD

## 2022-01-29 ENCOUNTER — Other Ambulatory Visit: Payer: Self-pay

## 2022-01-29 ENCOUNTER — Encounter (HOSPITAL_COMMUNITY): Payer: Self-pay | Admitting: Oral Surgery

## 2022-01-29 NOTE — Progress Notes (Signed)
°   01/29/22 1152  OBSTRUCTIVE SLEEP APNEA  Have you ever been diagnosed with sleep apnea through a sleep study? No  Do you snore loudly (loud enough to be heard through closed doors)?  1  Do you often feel tired, fatigued, or sleepy during the daytime (such as falling asleep during driving or talking to someone)? 0  Has anyone observed you stop breathing during your sleep? 0  Do you have, or are you being treated for high blood pressure? 0  BMI more than 35 kg/m2? 1  Age > 50 (1-yes) 1  Neck circumference greater than:Male 16 inches or larger, Male 17inches or larger? 1  Male Gender (Yes=1) 1  Obstructive Sleep Apnea Score 5  Score 5 or greater  Results sent to PCP

## 2022-01-29 NOTE — Progress Notes (Addendum)
SDW CALL  Patient was given pre-op instructions over the phone. The opportunity was given for the patient to ask questions. No further questions asked. Patient verbalized understanding of instructions given.  Out of service phone number listed for pt, called Dr. Lupita Leash office. Current phone [phone]. Updated in demographics.  PCP -  Dr. Kern Alberta Cardiologist - denies   Chest x-ray - n/a EKG -  DOS Stress Test - denies ECHO - denies Cardiac Cath - denies  Sleep Study -  denies--STOP BANG assessment tool routed to PCP CPAP -   Fasting Blood Sugar - 118-140   Checks Blood Sugar twice a day No Diabetes meds DOS, hold Farxiga 2/2. Check CBG DOS, if less than 70, treat with 1/2 cup of clear juice.  Blood Thinner Instructions: HOLD PLAVIX 5 days per PCP, clearance and instructions in paper chart Aspirin Instructions: n/a  ERAS Protcol - NPO  COVID TEST-  n/a: ambulatory  Anesthesia review: No  Patient denies shortness of breath, fever, cough and chest pain over the phone call

## 2022-01-31 ENCOUNTER — Ambulatory Visit (HOSPITAL_COMMUNITY)
Admission: RE | Admit: 2022-01-31 | Discharge: 2022-01-31 | Disposition: A | Discharge status: Home / Self Care | Payer: Medicaid Other | Attending: Oral Surgery | Admitting: Oral Surgery

## 2022-01-31 ENCOUNTER — Other Ambulatory Visit: Payer: Self-pay

## 2022-01-31 ENCOUNTER — Ambulatory Visit (HOSPITAL_COMMUNITY): Payer: Medicaid Other | Admitting: Anesthesiology

## 2022-01-31 ENCOUNTER — Encounter (HOSPITAL_COMMUNITY)
Admission: RE | Disposition: A | Discharge status: Home / Self Care | Payer: Self-pay | Source: Home / Self Care | Attending: Oral Surgery

## 2022-01-31 ENCOUNTER — Encounter (HOSPITAL_COMMUNITY): Payer: Self-pay | Admitting: Oral Surgery

## 2022-01-31 DIAGNOSIS — F419 Anxiety disorder, unspecified: Secondary | ICD-10-CM | POA: Insufficient documentation

## 2022-01-31 DIAGNOSIS — F17200 Nicotine dependence, unspecified, uncomplicated: Secondary | ICD-10-CM | POA: Diagnosis not present

## 2022-01-31 DIAGNOSIS — E669 Obesity, unspecified: Secondary | ICD-10-CM | POA: Insufficient documentation

## 2022-01-31 DIAGNOSIS — Z79899 Other long term (current) drug therapy: Secondary | ICD-10-CM | POA: Insufficient documentation

## 2022-01-31 DIAGNOSIS — E1151 Type 2 diabetes mellitus with diabetic peripheral angiopathy without gangrene: Secondary | ICD-10-CM | POA: Insufficient documentation

## 2022-01-31 DIAGNOSIS — I491 Atrial premature depolarization: Secondary | ICD-10-CM | POA: Diagnosis not present

## 2022-01-31 DIAGNOSIS — E119 Type 2 diabetes mellitus without complications: Secondary | ICD-10-CM | POA: Diagnosis not present

## 2022-01-31 DIAGNOSIS — F32A Depression, unspecified: Secondary | ICD-10-CM | POA: Insufficient documentation

## 2022-01-31 DIAGNOSIS — K029 Dental caries, unspecified: Secondary | ICD-10-CM | POA: Insufficient documentation

## 2022-01-31 DIAGNOSIS — M270 Developmental disorders of jaws: Secondary | ICD-10-CM | POA: Diagnosis not present

## 2022-01-31 HISTORY — DX: Chronic obstructive pulmonary disease, unspecified: J44.9

## 2022-01-31 HISTORY — DX: Claustrophobia: F40.240

## 2022-01-31 HISTORY — DX: Bronchitis, not specified as acute or chronic: J40

## 2022-01-31 HISTORY — PX: TOOTH EXTRACTION: SHX859

## 2022-01-31 LAB — BASIC METABOLIC PANEL
Anion gap: 9 (ref 5–15)
BUN: 11 mg/dL (ref 6–20)
CO2: 23 mmol/L (ref 22–32)
Calcium: 9.2 mg/dL (ref 8.9–10.3)
Chloride: 106 mmol/L (ref 98–111)
Creatinine, Ser: 0.62 mg/dL (ref 0.61–1.24)
GFR, Estimated: >60 mL/min (ref >60)
Glucose, Bld: 148 mg/dL — ABNORMAL HIGH (ref 70–99)
Potassium: 4.3 mmol/L (ref 3.5–5.1)
Sodium: 138 mmol/L (ref 135–145)

## 2022-01-31 LAB — GLUCOSE, CAPILLARY
Glucose-Capillary: 152 mg/dL — ABNORMAL HIGH (ref 70–99)
Glucose-Capillary: 184 mg/dL — ABNORMAL HIGH (ref 70–99)

## 2022-01-31 LAB — CBC
HCT: 50 % (ref 39.0–52.0)
Hemoglobin: 16.4 g/dL (ref 13.0–17.0)
MCH: 31.3 pg (ref 26.0–34.0)
MCHC: 32.8 g/dL (ref 30.0–36.0)
MCV: 95.4 fL (ref 80.0–100.0)
Platelets: 183 10*3/uL (ref 150–400)
RBC: 5.24 MIL/uL (ref 4.22–5.81)
RDW: 12.4 % (ref 11.5–15.5)
WBC: 8.8 10*3/uL (ref 4.0–10.5)
nRBC: 0 % (ref 0.0–0.2)

## 2022-01-31 SURGERY — DENTAL RESTORATION/EXTRACTIONS
Anesthesia: General

## 2022-01-31 MED ORDER — SODIUM CHLORIDE 0.9 % IR SOLN
Status: DC | PRN
  Administered 2022-01-31: 1000 mL

## 2022-01-31 MED ORDER — PHENYLEPHRINE 40 MCG/ML (10ML) SYRINGE FOR IV PUSH (FOR BLOOD PRESSURE SUPPORT)
PREFILLED_SYRINGE | INTRAVENOUS | Status: AC
  Filled 2022-01-31: qty 10

## 2022-01-31 MED ORDER — ACETAMINOPHEN 10 MG/ML IV SOLN
1000.0000 mg | Freq: Once | INTRAVENOUS | Status: DC | PRN
  Administered 2022-01-31: 1000 mg via INTRAVENOUS

## 2022-01-31 MED ORDER — OXYCODONE HCL 5 MG PO TABS
5.0000 mg | ORAL_TABLET | Freq: Once | ORAL | Status: AC | PRN
  Administered 2022-01-31: 5 mg via ORAL

## 2022-01-31 MED ORDER — ORAL CARE MOUTH RINSE: 15.0000 mL | Freq: Once | OROMUCOSAL | Status: AC

## 2022-01-31 MED ORDER — MIDAZOLAM HCL 2 MG/2ML IJ SOLN
INTRAMUSCULAR | Status: DC | PRN
  Administered 2022-01-31: 2 mg via INTRAVENOUS

## 2022-01-31 MED ORDER — CEFAZOLIN SODIUM-DEXTROSE 2-4 GM/100ML-% IV SOLN: 2.0000 g | INTRAVENOUS | Status: DC

## 2022-01-31 MED ORDER — OXYCODONE-ACETAMINOPHEN 5-325 MG PO TABS: 1.0000 | ORAL_TABLET | ORAL | 0 refills | Status: DC | PRN

## 2022-01-31 MED ORDER — ACETAMINOPHEN 10 MG/ML IV SOLN
INTRAVENOUS | Status: AC
  Filled 2022-01-31: qty 100

## 2022-01-31 MED ORDER — FENTANYL CITRATE (PF) 100 MCG/2ML IJ SOLN
INTRAMUSCULAR | Status: AC
  Filled 2022-01-31: qty 2

## 2022-01-31 MED ORDER — PHENYLEPHRINE 40 MCG/ML (10ML) SYRINGE FOR IV PUSH (FOR BLOOD PRESSURE SUPPORT)
PREFILLED_SYRINGE | INTRAVENOUS | Status: DC | PRN
Start: 2022-01-31 — End: 2022-01-31
  Administered 2022-01-31 (×2): 40 ug via INTRAVENOUS
  Administered 2022-01-31: 80 ug via INTRAVENOUS

## 2022-01-31 MED ORDER — CEFAZOLIN SODIUM-DEXTROSE 2-4 GM/100ML-% IV SOLN
INTRAVENOUS | Status: AC
  Filled 2022-01-31: qty 100

## 2022-01-31 MED ORDER — LIDOCAINE 2% (20 MG/ML) 5 ML SYRINGE
INTRAMUSCULAR | Status: DC | PRN
Start: 2022-01-31 — End: 2022-01-31
  Administered 2022-01-31: 100 mg via INTRAVENOUS

## 2022-01-31 MED ORDER — SUGAMMADEX SODIUM 200 MG/2ML IV SOLN
INTRAVENOUS | Status: DC | PRN
  Administered 2022-01-31: 500 mg via INTRAVENOUS

## 2022-01-31 MED ORDER — OXYCODONE HCL 5 MG PO TABS
ORAL_TABLET | ORAL | Status: AC
  Filled 2022-01-31: qty 1

## 2022-01-31 MED ORDER — CHLORHEXIDINE GLUCONATE 0.12 % MT SOLN
OROMUCOSAL | Status: AC
  Administered 2022-01-31: 15 mL via OROMUCOSAL
  Filled 2022-01-31: qty 15

## 2022-01-31 MED ORDER — LIDOCAINE-EPINEPHRINE 2 %-1:100000 IJ SOLN
INTRAMUSCULAR | Status: DC | PRN
  Administered 2022-01-31: 12 mL via INTRADERMAL

## 2022-01-31 MED ORDER — AMOXICILLIN 500 MG PO CAPS: 500.0000 mg | ORAL_CAPSULE | Freq: Two times a day (BID) | ORAL | 0 refills | Status: DC

## 2022-01-31 MED ORDER — FENTANYL CITRATE (PF) 250 MCG/5ML IJ SOLN
INTRAMUSCULAR | Status: DC | PRN
Start: 2022-01-31 — End: 2022-01-31
  Administered 2022-01-31: 50 ug via INTRAVENOUS
  Administered 2022-01-31: 150 ug via INTRAVENOUS
  Administered 2022-01-31: 50 ug via INTRAVENOUS

## 2022-01-31 MED ORDER — LIDOCAINE 2% (20 MG/ML) 5 ML SYRINGE
INTRAMUSCULAR | Status: AC
  Filled 2022-01-31: qty 5

## 2022-01-31 MED ORDER — PROPOFOL 10 MG/ML IV BOLUS
INTRAVENOUS | Status: AC
  Filled 2022-01-31: qty 20

## 2022-01-31 MED ORDER — ROCURONIUM BROMIDE 10 MG/ML (PF) SYRINGE
PREFILLED_SYRINGE | INTRAVENOUS | Status: AC
  Filled 2022-01-31: qty 10

## 2022-01-31 MED ORDER — CEFAZOLIN IN SODIUM CHLORIDE 3-0.9 GM/100ML-% IV SOLN
3.0000 g | INTRAVENOUS | Status: AC
  Administered 2022-01-31: 2 g via INTRAVENOUS

## 2022-01-31 MED ORDER — FENTANYL CITRATE (PF) 100 MCG/2ML IJ SOLN
25.0000 ug | INTRAMUSCULAR | Status: DC | PRN
  Administered 2022-01-31: 25 ug via INTRAVENOUS

## 2022-01-31 MED ORDER — ROCURONIUM BROMIDE 10 MG/ML (PF) SYRINGE
PREFILLED_SYRINGE | INTRAVENOUS | Status: DC | PRN
Start: 2022-01-31 — End: 2022-01-31
  Administered 2022-01-31: 80 mg via INTRAVENOUS
  Administered 2022-01-31: 20 mg via INTRAVENOUS

## 2022-01-31 MED ORDER — DEXMEDETOMIDINE (PRECEDEX) IN NS 20 MCG/5ML (4 MCG/ML) IV SYRINGE
PREFILLED_SYRINGE | INTRAVENOUS | Status: DC | PRN
  Administered 2022-01-31: 8 ug via INTRAVENOUS
  Administered 2022-01-31: 4 ug via INTRAVENOUS

## 2022-01-31 MED ORDER — CHLORHEXIDINE GLUCONATE 0.12 % MT SOLN: 15.0000 mL | Freq: Once | OROMUCOSAL | Status: AC

## 2022-01-31 MED ORDER — PROPOFOL 10 MG/ML IV BOLUS
INTRAVENOUS | Status: DC | PRN
  Administered 2022-01-31: 200 mg via INTRAVENOUS

## 2022-01-31 MED ORDER — MIDAZOLAM HCL 2 MG/2ML IJ SOLN
INTRAMUSCULAR | Status: AC
  Filled 2022-01-31: qty 2

## 2022-01-31 MED ORDER — ONDANSETRON HCL 4 MG/2ML IJ SOLN
INTRAMUSCULAR | Status: DC | PRN
Start: 2022-01-31 — End: 2022-01-31
  Administered 2022-01-31: 4 mg via INTRAVENOUS

## 2022-01-31 MED ORDER — ACETAMINOPHEN 500 MG PO TABS: 1000.0000 mg | ORAL_TABLET | Freq: Once | ORAL | Status: DC | PRN

## 2022-01-31 MED ORDER — SUGAMMADEX SODIUM 500 MG/5ML IV SOLN
INTRAVENOUS | Status: AC
  Filled 2022-01-31: qty 5

## 2022-01-31 MED ORDER — 0.9 % SODIUM CHLORIDE (POUR BTL) OPTIME
TOPICAL | Status: DC | PRN
  Administered 2022-01-31: 1000 mL

## 2022-01-31 MED ORDER — ONDANSETRON HCL 4 MG/2ML IJ SOLN
INTRAMUSCULAR | Status: AC
  Filled 2022-01-31: qty 2

## 2022-01-31 MED ORDER — ACETAMINOPHEN 160 MG/5ML PO SOLN: 1000.0000 mg | Freq: Once | ORAL | Status: DC | PRN

## 2022-01-31 MED ORDER — OXYCODONE HCL 5 MG/5ML PO SOLN: 5.0000 mg | Freq: Once | ORAL | Status: AC | PRN

## 2022-01-31 MED ORDER — FENTANYL CITRATE (PF) 250 MCG/5ML IJ SOLN
INTRAMUSCULAR | Status: AC
  Filled 2022-01-31: qty 5

## 2022-01-31 MED ORDER — LACTATED RINGERS IV SOLN: INTRAVENOUS | Status: DC

## 2022-01-31 MED ORDER — DEXAMETHASONE SODIUM PHOSPHATE 10 MG/ML IJ SOLN
INTRAMUSCULAR | Status: DC | PRN
Start: 2022-01-31 — End: 2022-01-31
  Administered 2022-01-31: 5 mg via INTRAVENOUS

## 2022-01-31 MED ORDER — DEXAMETHASONE SODIUM PHOSPHATE 10 MG/ML IJ SOLN
INTRAMUSCULAR | Status: AC
  Filled 2022-01-31: qty 1

## 2022-01-31 SURGICAL SUPPLY — 35 items
BAG COUNTER SPONGE SURGICOUNT (BAG) IMPLANT
BLADE SURG 15 STRL LF DISP TIS (BLADE) ×1 IMPLANT
BLADE SURG 15 STRL SS (BLADE) ×1
BUR CROSS CUT FISSURE 1.6 (BURR) ×2 IMPLANT
BUR EGG ELITE 4.0 (BURR) ×2 IMPLANT
CANISTER SUCT 3000ML PPV (MISCELLANEOUS) ×2 IMPLANT
COVER SURGICAL LIGHT HANDLE (MISCELLANEOUS) ×2 IMPLANT
DRAPE U-SHAPE 76X120 STRL (DRAPES) ×2 IMPLANT
GAUZE PACKING FOLDED 2  STR (GAUZE/BANDAGES/DRESSINGS) ×1
GAUZE PACKING FOLDED 2 STR (GAUZE/BANDAGES/DRESSINGS) ×1 IMPLANT
GLOVE SURG ENC MOIS LTX SZ6.5 (GLOVE) IMPLANT
GLOVE SURG ENC MOIS LTX SZ7 (GLOVE) IMPLANT
GLOVE SURG ENC MOIS LTX SZ8 (GLOVE) ×2 IMPLANT
GLOVE SURG UNDER POLY LF SZ6.5 (GLOVE) IMPLANT
GLOVE SURG UNDER POLY LF SZ7 (GLOVE) IMPLANT
GOWN STRL REUS W/ TWL LRG LVL3 (GOWN DISPOSABLE) ×1 IMPLANT
GOWN STRL REUS W/ TWL XL LVL3 (GOWN DISPOSABLE) ×1 IMPLANT
GOWN STRL REUS W/TWL LRG LVL3 (GOWN DISPOSABLE) ×1
GOWN STRL REUS W/TWL XL LVL3 (GOWN DISPOSABLE) ×1
IV NS 1000ML (IV SOLUTION) ×1
IV NS 1000ML BAXH (IV SOLUTION) ×1 IMPLANT
KIT BASIN OR (CUSTOM PROCEDURE TRAY) ×2 IMPLANT
KIT TURNOVER KIT B (KITS) ×2 IMPLANT
NDL HYPO 25GX1X1/2 BEV (NEEDLE) ×2 IMPLANT
NEEDLE HYPO 25GX1X1/2 BEV (NEEDLE) ×4 IMPLANT
NS IRRIG 1000ML POUR BTL (IV SOLUTION) ×2 IMPLANT
PAD ARMBOARD 7.5X6 YLW CONV (MISCELLANEOUS) ×2 IMPLANT
SLEEVE IRRIGATION ELITE 7 (MISCELLANEOUS) ×2 IMPLANT
SPONGE SURGIFOAM ABS GEL 12-7 (HEMOSTASIS) IMPLANT
SUT CHROMIC 3 0 PS 2 (SUTURE) ×3 IMPLANT
SYR BULB IRRIG 60ML STRL (SYRINGE) ×2 IMPLANT
SYR CONTROL 10ML LL (SYRINGE) ×2 IMPLANT
TRAY ENT MC OR (CUSTOM PROCEDURE TRAY) ×2 IMPLANT
TUBING IRRIGATION (MISCELLANEOUS) ×2 IMPLANT
YANKAUER SUCT BULB TIP NO VENT (SUCTIONS) ×2 IMPLANT

## 2022-01-31 NOTE — Op Note (Signed)
NAME: Derrick Clark, Derrick Clark MEDICAL RECORD NO: [id number] ACCOUNT NO: [id number] DATE OF BIRTH: 1961-02-13 FACILITY: [facility] LOCATION: [facility] PHYSICIAN: Gae Bon, DDS  Operative Report   DATE OF PROCEDURE: 01/31/2022  PREOPERATIVE DIAGNOSES:  Nonrestorable teeth numbers 5, 9, 10, 11, 14, 15, 20, 21, 22, 23, 24, 25, 26, 27, 28, 29 secondary to dental caries and bilateral mandibular lingual tori.  POSTOPERATIVE DIAGNOSES:  Nonrestorable teeth numbers 5, 9, 10, 11, 14, 15, 20, 21, 22, 23, 24, 25, 26, 27, 28, 29 secondary to dental caries and bilateral mandibular lingual tori.  PROCEDURE:  Extraction teeth numbers 5, 9, 10, 11, 14, 15, 20, 21, 22, 23, 24, 25, 26, 27, 28, 29; alveoloplasty left maxilla and mandible and right mandible and removal of bilateral mandibular lingual tori.  SURGEON:  Diona Browner, DDS  ANESTHESIA:  Cassandria Santee, attending.  DESCRIPTION OF PROCEDURE:  The patient was taken to the operating room and placed on the table in supine position.  General anesthesia was administered and nasal endotracheal tube was placed and the eyes were protected.  The patient was draped for  surgery.  Timeout was performed.  The posterior pharynx was suctioned and the throat pack was placed.  General anesthesia was administered.  An oral endotracheal tube was placed after a nasal tube could not be passed.  The tube was secured to the right  side of the mouth and then the patient was draped for surgery.  Timeout was performed.  The posterior pharynx was suctioned and a throat pack was placed.  2% lidocaine 1:100,000 epinephrine was infiltrated in an inferior alveolar block on the right and  left sides and in buccal and palatal infiltration in the maxilla.  Bite block was placed on the right side of the mouth. A sweetheart retractor was used to retract the tongue.  The left mandible was operated first.  A 15 blade was used to make an  incision in the area of tooth #18 on the alveolar crest in  this edentulous area and then the incision was carried forward to tooth #20 and then, an incision was made buccally in the gingival sulcus and lingually in the gingival sulcus across the midline  until tooth #27 was encountered.  The periosteum was reflected from around these teeth.  Because the teeth were severely decayed and no crown remained for purchase, the Stryker handpiece with a fissure bur was used to remove interproximal bone from  around teeth numbers 20, 21 and 22.  Then, the teeth were elevated with the 301 elevator and then removed from the mouth with the Ash dental forceps.  After these teeth were removed, the periosteum was reflected to expose the alveolar crest, which was  irregular in contour and required alveoloplasty and also to expose the lingual torus. The lingual torus was reduced using the Stryker handpiece with egg bur under irrigation to recontour the lingual bone.  Then, the area was smoothed with a bone file. In  the buccal aspect, there were undercuts and alveoplasty was performed using the egg bur followed by the bone file and then this quadrant was irrigated and closed with 3-0 chromic.  Then in the left maxilla, the 15 blade was used to make an incision  around teeth numbers 14 and 15 and carried forward along the edentulous crest to tooth numbers 11, 10 and 9.  The incision was created around these teeth in the gingival sulcus.  The periosteum was reflected.  Tooth #15 was removed using the dental  forceps.  Tooth #14 was the root fragment that was removed with a rongeur.  Then, Stryker handpiece with a fissure bur was used to remove interproximal bone around teeth numbers 9, 10 and 11.  The teeth were elevated and then removed with the rongeurs  and dental forceps.  The sockets were curetted.  Alveoloplasty was performed using the egg bur and the bone file and then the left maxilla was irrigated and closed with 3-0 chromic.  Then, the throat pack was removed.  The  endotracheal tube was re-taped  to the left side of the mouth and a new throat pack was placed.  Then, the 15 blade was used to make an incision beginning in the area of tooth #30 in the edentulous space, carried forward around teeth numbers 29, 28 and 27 in the buccal and lingual  sulcus.  The periosteum was reflected.  The teeth were unable to be removed with a forceps and so the Stryker handpiece was used to remove interproximal bone.  Then, the teeth were elevated and removed with the 301 elevator.  The sockets were curetted.   The periosteum was reflected to expose the lingual torus and the alveolar crest.  Alveoloplasty was performed with egg bur and the bone file was used to further smooth this area and the lingual torus was reduced using the Stryker handpiece with egg bur  under irrigation.  When this was completed, the area was irrigated and closed with 3-0 chromic.  Then, the 15 blade was used to make an incision proximally and distally to tooth #5 along the alveolar crest.  The tooth was luxated with 301 elevator and  removed from the mouth with a dental forceps.  The socket was curetted.  Tissue was reflected and the Stryker handpiece was used to reduce the bony prominence in the area of tooth #5 on the buccal aspect.  Then, this area was closed with 3-0 chromic.   The oral cavity was then irrigated and suctioned.  The throat pack was removed.  The patient was left under the care of anesthesia for extubation and transported to recovery room with plans for discharge home through day surgery.  ESTIMATED BLOOD LOSS:  Minimal.  COMPLICATIONS:  None.   SHW D: 01/31/2022 9:51:33 am T: 01/31/2022 10:47:00 am  JOB: [id number]/ [id number]

## 2022-01-31 NOTE — Op Note (Signed)
01/31/2022  9:43 AM  PATIENT:  Derrick Clark  61 y.o. male  PRE-OPERATIVE DIAGNOSIS:  NON-RESTORABLE TEETH # 5, 9, 10, 11, 14,15, 20, 21, 22, 23, 24, 25, 26, 27, 28, 29 SECONDARY TO DENTAL CARIES; BILATERAL MANDIBULAR LINGUAL TORI  POST-OPERATIVE DIAGNOSIS:  SAME  PROCEDURE:  Procedure(s): EXTRACTION TEETH # 5, 9, 10, 11, 14,15, 20, 21, 22, 23, 24, 25, 26, 27, 28, 29 ALVEOLOPLASTY; REMOVAL BILATERAL MANDIBULAR LINGUAL TORI  SURGEON:  Surgeon(s): Diona Browner, DMD  ANESTHESIA:   local and general  EBL:  minimal  DRAINS: none   SPECIMEN:  No Specimen  COUNTS:  YES  PLAN OF CARE: Discharge to home after PACU  PATIENT DISPOSITION:  PACU - hemodynamically stable.   PROCEDURE DETAILS: Dictation # [id number]  Gae Bon, DMD 01/31/2022 9:43 AM

## 2022-01-31 NOTE — Anesthesia Procedure Notes (Signed)
Procedure Name: Intubation Date/Time: 01/31/2022 8:46 AM Performed by: Lorie Phenix, CRNA Pre-anesthesia Checklist: Patient identified, Emergency Drugs available, Suction available, Patient being monitored and Timeout performed Patient Re-evaluated:Patient Re-evaluated prior to induction Oxygen Delivery Method: Circle system utilized Preoxygenation: Pre-oxygenation with 100% oxygen Induction Type: IV induction Ventilation: Mask ventilation without difficulty and Oral airway inserted - appropriate to patient size Laryngoscope Size: Mac and 4 Grade View: Grade I Tube type: Oral Tube size: 7.5 mm Number of attempts: 2 Airway Equipment and Method: Stylet Placement Confirmation: ETT inserted through vocal cords under direct vision, positive ETCO2 and breath sounds checked- equal and bilateral Secured at: 21 cm Tube secured with: Tape Dental Injury: Teeth and Oropharynx as per pre-operative assessment  Comments: Attempt at passage of nasal ETT in right nare with resistance and no view of tube in oropharynx after easy passage through right nare. No trauma noted but decision made to proceed with oral ETT

## 2022-01-31 NOTE — Anesthesia Postprocedure Evaluation (Signed)
Anesthesia Post Note  Patient: Derrick Clark  Procedure(s) Performed: DENTAL RESTORATION/EXTRACTIONS     Patient location during evaluation: PACU Anesthesia Type: General Level of consciousness: awake and alert Pain management: pain level controlled Vital Signs Assessment: post-procedure vital signs reviewed and stable Respiratory status: spontaneous breathing, nonlabored ventilation, respiratory function stable and patient connected to nasal cannula oxygen Cardiovascular status: blood pressure returned to baseline and stable Postop Assessment: no apparent nausea or vomiting Anesthetic complications: no   No notable events documented.  Last Vitals:  Vitals:   01/31/22 1012 01/31/22 1027  BP: (!) 141/73 127/75  Pulse: 78 74  Resp: 20 19  Temp:  36.7 C  SpO2: 95% 95%    Last Pain:  Vitals:   01/31/22 1027  TempSrc:   PainSc: 4                  Barnet Glasgow

## 2022-01-31 NOTE — Transfer of Care (Signed)
Immediate Anesthesia Transfer of Care Note  Patient: Derrick Clark  Procedure(s) Performed: DENTAL RESTORATION/EXTRACTIONS  Patient Location: PACU  Anesthesia Type:General  Level of Consciousness: awake  Airway & Oxygen Therapy: Patient Spontanous Breathing and Patient connected to face mask oxygen  Post-op Assessment: Report given to RN and Post -op Vital signs reviewed and stable  Post vital signs: Reviewed and stable  Last Vitals:  Vitals Value Taken Time  BP 153/88 01/31/22 0955  Temp    Pulse 79 01/31/22 0957  Resp 19 01/31/22 0957  SpO2 99 % 01/31/22 0957  Vitals shown include unvalidated device data.  Last Pain:  Vitals:   01/31/22 0645  TempSrc:   PainSc: 4       Patients Stated Pain Goal: 2 (16/38/45 3646)  Complications: No notable events documented.

## 2022-01-31 NOTE — Anesthesia Preprocedure Evaluation (Signed)
Anesthesia Evaluation  Patient identified by MRN, date of birth, ID band Patient awake    Reviewed: Allergy & Precautions, NPO status , Patient's Chart, lab work & pertinent test results  History of Anesthesia Complications Negative for: history of anesthetic complications  Airway Mallampati: II  TM Distance: >3 FB Neck ROM: Full    Dental  (+) Poor Dentition, Chipped, Missing   Pulmonary neg shortness of breath, COPD,  COPD inhaler, neg recent URI, Current Smoker and Patient abstained from smoking.,    breath sounds clear to auscultation       Cardiovascular (-) angina+ Peripheral Vascular Disease   Rhythm:Regular     Neuro/Psych PSYCHIATRIC DISORDERS Anxiety Depression  Neuromuscular disease    GI/Hepatic negative GI ROS, Neg liver ROS,   Endo/Other  diabetes, Type 2  Renal/GU negative Renal ROS     Musculoskeletal  (+) Arthritis ,   Abdominal   Peds  Hematology negative hematology ROS (+) Lab Results      Component                Value               Date                      WBC                      8.8                 01/31/2022                HGB                      16.4                01/31/2022                HCT                      50.0                01/31/2022                MCV                      95.4                01/31/2022                PLT                      183                 01/31/2022            plavix held for 5 days   Anesthesia Other Findings   Reproductive/Obstetrics                             Anesthesia Physical Anesthesia Plan  ASA: 2  Anesthesia Plan: General   Post-op Pain Management: Minimal or no pain anticipated   Induction: Intravenous  PONV Risk Score and Plan: 1 and Ondansetron and Dexamethasone  Airway Management Planned: Nasal ETT  Additional Equipment: None  Intra-op Plan:   Post-operative Plan: Extubation in OR  Informed  Consent: I have reviewed the patients History and  Physical, chart, labs and discussed the procedure including the risks, benefits and alternatives for the proposed anesthesia with the patient or authorized representative who has indicated his/her understanding and acceptance.     Dental advisory given  Plan Discussed with: CRNA and Anesthesiologist  Anesthesia Plan Comments: (5mg  decadron)        Anesthesia Quick Evaluation

## 2022-01-31 NOTE — H&P (Signed)
H&P documentation  -History and Physical Reviewed  -Patient has been re-examined  -No change in the plan of care  Derrick Clark  

## 2022-02-01 ENCOUNTER — Encounter (HOSPITAL_COMMUNITY): Payer: Self-pay | Admitting: Oral Surgery

## 2022-03-10 ENCOUNTER — Ambulatory Visit (INDEPENDENT_AMBULATORY_CARE_PROVIDER_SITE_OTHER): Payer: Medicaid Other | Admitting: Gastroenterology

## 2022-03-10 ENCOUNTER — Encounter (INDEPENDENT_AMBULATORY_CARE_PROVIDER_SITE_OTHER): Payer: Self-pay | Admitting: Gastroenterology

## 2022-03-10 ENCOUNTER — Encounter (INDEPENDENT_AMBULATORY_CARE_PROVIDER_SITE_OTHER): Payer: Self-pay | Admitting: *Deleted

## 2022-05-13 ENCOUNTER — Ambulatory Visit
Admission: EM | Admit: 2022-05-13 | Discharge: 2022-05-13 | Disposition: A | Discharge status: Home / Self Care | Payer: Medicaid Other | Attending: Family Medicine | Admitting: Family Medicine

## 2022-05-13 DIAGNOSIS — S39012A Strain of muscle, fascia and tendon of lower back, initial encounter: Secondary | ICD-10-CM

## 2022-05-13 DIAGNOSIS — M25521 Pain in right elbow: Secondary | ICD-10-CM

## 2022-05-13 MED ORDER — CYCLOBENZAPRINE HCL 10 MG PO TABS: 10.0000 mg | ORAL_TABLET | Freq: Three times a day (TID) | ORAL | 0 refills | Status: DC | PRN

## 2022-05-13 MED ORDER — DICLOFENAC SODIUM 1 % EX GEL: 2.0000 g | Freq: Four times a day (QID) | CUTANEOUS | 2 refills | Status: DC

## 2022-05-13 NOTE — ED Triage Notes (Signed)
Pt states a few weeks ago he injured his right elbow while lifting something ? ?Pt states his right elbow is sore to the touch ? ?Pt states the same day his lower back started hurting ? ?Pt states he tried Ice pack and heating pack, lidocaine patches and muscle rubs without any relief ?

## 2022-05-13 NOTE — ED Provider Notes (Signed)
?[facility] ? ? ? ?CSN: [id number] ?Arrival date & time: 05/13/22  1720 ? ? ?  ? ?History   ?Chief Complaint ?Chief Complaint  ?Patient presents with  ? Back Pain  ?  Lower back pain and right elbow is swollen ?  ? ? ?HPI ?Derrick Clark is a 61 y.o. male.  ? ?Presenting today with several week history of right medial elbow pain that he thinks he injured while lifting something.  He states the elbow is sore to the touch, worse with movement and rotation of the wrist.  Swelling to the area but denies any discoloration, weakness, numbness, tingling.  Has been trying topical pain gels, ice, heat with mild temporary relief.  He also states that around the same time he started having low back soreness bilaterally.  Denies any known injury to this area.  Pain is worse with movement.  No radiation of pain down legs, numbness, tingling, weakness, bowel or bladder incontinence, saddle anesthesia. ? ? ?Past Medical History:  ?Diagnosis Date  ? Abdominal hernia   ? Arthritis   ? Bronchitis   ? Claustrophobia   ? COPD (chronic obstructive pulmonary disease) ([place])   ? Diabetes mellitus without complication ([place])   ? Nonhealing surgical wound   ? nonviable tissue  ? PAD (peripheral artery disease) ([place])   ? ? ?Patient Active Problem List  ? Diagnosis Date Noted  ? Seropositive rheumatoid arthritis ([place]) 03/06/2021  ? High risk medication use 03/06/2021  ? Joint swelling 02/22/2021  ? Osteoarthritis of both knees 02/22/2021  ? Generalized osteoarthritis of multiple sites 02/22/2021  ? Shoulder tendonitis 02/22/2021  ? Diastasis recti 07/24/2020  ? Umbilical hernia without obstruction and without gangrene 07/24/2020  ? Impingement syndrome of right shoulder 11/02/2019  ? PAD (peripheral artery disease) ([place]) 02/10/2019  ? Displacement of lumbar intervertebral disc without myelopathy 10/01/2018  ? Disorder of adrenal gland ([place]) 06/07/2018  ? Adhesive capsulitis of left shoulder 06/02/2018  ? Sciatica 05/21/2018  ?  Impingement syndrome of left shoulder region 10/21/2017  ? Bursitis/tendonitis, shoulder 09/16/2017  ? 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 Major depressive disorder, single episode, in remission ([place]) 01/24/2015  ? Dysmetabolic syndrome X 57/32/2025  ? Mixed hyperlipidemia 09/05/2014  ? Tobacco use disorder 09/05/2014  ? Type 2 (non-insulin dependent type) or unspecified type diabetes mellitus with neurological manifestations, uncontrolled 09/05/2014  ? Balanoposthitis 01/01/2013  ? ? ?Past Surgical History:  ?Procedure Laterality Date  ? ABDOMINAL AORTOGRAM W/LOWER EXTREMITY N/A 01/31/2019  ? Procedure: ABDOMINAL AORTOGRAM W/LOWER EXTREMITY;  Surgeon: Waynetta Sandy, MD;  Location: [facility];  Service: Cardiovascular;  Laterality: N/A;  ? AMPUTATION Right 02/01/2019  ? Procedure: AMPUTATION RIGHT FIFTH TOE;  Surgeon: Angelia Mould, MD;  Location: [facility];  Service: Vascular;  Laterality: Right;  ? AMPUTATION Right 02/10/2019  ? Procedure: AMPUTATION RAY RIGHT 5TH TOE WITH DEBRIDEMENT WOUND BED;  Surgeon: Waynetta Sandy, MD;  Location: [facility];  Service: Vascular;  Laterality: Right;  ? AMPUTATION TOE Right 02/10/2019  ? 5TH   ? PERIPHERAL VASCULAR INTERVENTION Right 01/31/2019  ? Procedure: PERIPHERAL VASCULAR INTERVENTION;  Surgeon: Waynetta Sandy, MD;  Location: [facility];  Service: Cardiovascular;  Laterality: Right;  SFA  ? SHOULDER SURGERY Left   ? TOOTH EXTRACTION N/A 01/31/2022  ? Procedure: DENTAL RESTORATION/EXTRACTIONS;  Surgeon: Diona Browner, DMD;  Location: [facility];  Service: Oral Surgery;  Laterality: N/A;  ? ? ? ? ? ?Home  Medications   ? ?Prior to Admission medications   ?Medication Sig Start Date End Date Taking? Authorizing Provider  ?cyclobenzaprine (FLEXERIL) 10 MG tablet Take 1 tablet (10 mg total) by mouth 3 (three) times daily as needed for muscle spasms. Do not drink alcohol or drive while taking this medication.  May cause  drowsiness. 05/13/22  Yes Volney American, PA-C  ?diclofenac Sodium (VOLTAREN) 1 % GEL Apply 2 g topically 4 (four) times daily. 05/13/22  Yes Volney American, PA-C  ?amoxicillin (AMOXIL) 500 MG capsule Take 1 capsule (500 mg total) by mouth 2 (two) times daily. 01/31/22   Diona Browner, DMD  ?celecoxib (CELEBREX) 200 MG capsule Take 200 mg by mouth daily. 12/06/20   [provider]  ?clopidogrel (PLAVIX) 75 MG tablet Take 1 tablet (75 mg total) by mouth daily with breakfast. 02/02/19   Ulyses Amor, PA-C  ?dapagliflozin propanediol (FARXIGA) 10 MG TABS tablet Take 10 mg by mouth daily.    [provider]  ?Exenatide ER (BYDUREON) 2 MG PEN Inject 2 mg as directed once a week. 03/17/20   [provider]  ?gabapentin (NEURONTIN) 600 MG tablet Take 600 mg by mouth 3 (three) times daily. 04/17/17   [provider]  ?glipiZIDE (GLUCOTROL XL) 10 MG 24 hr tablet Take 10 mg by mouth daily.    [provider]  ?leflunomide (ARAVA) 20 MG tablet Take 1 tablet by mouth once daily 11/20/21   Rice, Resa Miner, MD  ?metFORMIN (GLUCOPHAGE-XR) 500 MG 24 hr tablet Take 1,000 mg by mouth 2 (two) times daily. 01/11/19   [provider]  ?oxyCODONE-acetaminophen (PERCOCET) 5-325 MG tablet Take 1 tablet by mouth every 4 (four) hours as needed. 01/31/22   Diona Browner, DMD  ?sildenafil (REVATIO) 20 MG tablet Take 20 mg by mouth daily as needed for erectile dysfunction. 12/04/21   [provider]  ?simvastatin (ZOCOR) 40 MG tablet Take 40 mg by mouth every evening.     [provider]  ?SYMBICORT 160-4.5 MCG/ACT inhaler 2 puffs daily as needed (COPD). 07/16/21   [provider]  ?tamsulosin (FLOMAX) 0.4 MG CAPS capsule Take 0.4 mg by mouth at bedtime. 08/31/21   [provider]  ? ? ?Family History ?Family History  ?Problem Relation Age of Onset  ? Diabetes Mother   ? Heart failure Mother   ? Cancer Father   ? Diabetes Brother   ? Asthma Son   ?  Healthy Son   ? Healthy Son   ? Healthy Son   ? Healthy Son   ? ? ?Social History ?Social History  ? ?Tobacco Use  ? Smoking status: Every Day  ?  Packs/day: 1.00  ?  Years: 40.00  ?  Pack years: 40.00  ?  Types: Cigarettes  ? Smokeless tobacco: Never  ?Vaping Use  ? Vaping Use: Never used  ?Substance Use Topics  ? Alcohol use: Yes  ?  Comment: Rarely  ? Drug use: No  ? ? ? ?Allergies   ?Jardiance [empagliflozin] and Sulfa antibiotics ? ? ?Review of Systems ?Review of Systems ?Per HPI ? ?Physical Exam ?Triage Vital Signs ?ED Triage Vitals  ?Enc Vitals Group  ?   BP 05/13/22 1749 120/77  ?   Pulse Rate 05/13/22 1749 80  ?   Resp 05/13/22 1749 18  ?   Temp 05/13/22 1749 98.3 ?F (36.8 ?C)  ?   Temp Source 05/13/22 1749 Oral  ?   SpO2 05/13/22 1749 94 %  ?  Weight --   ?   Height --   ?   Head Circumference --   ?   Peak Flow --   ?   Pain Score 05/13/22 1752 8  ?   Pain Loc --   ?   Pain Edu? --   ?   Excl. in [place]? --   ? ?No data found. ? ?Updated Vital Signs ?BP 120/77 (BP Location: Right Arm)   Pulse 80   Temp 98.3 ?F (36.8 ?C) (Oral)   Resp 18   SpO2 94%  ? ?Visual Acuity ?Right Eye Distance:   ?Left Eye Distance:   ?Bilateral Distance:   ? ?Right Eye Near:   ?Left Eye Near:    ?Bilateral Near:    ? ?Physical Exam ?Vitals and nursing note reviewed.  ?Constitutional:   ?   Appearance: Normal appearance.  ?HENT:  ?   Head: Atraumatic.  ?   Mouth/Throat:  ?   Mouth: Mucous membranes are moist.  ?Eyes:  ?   Extraocular Movements: Extraocular movements intact.  ?   Conjunctiva/sclera: Conjunctivae normal.  ?Cardiovascular:  ?   Rate and Rhythm: Normal rate and regular rhythm.  ?Pulmonary:  ?   Effort: Pulmonary effort is normal.  ?   Breath sounds: Normal breath sounds.  ?Musculoskeletal:     ?   General: Swelling, tenderness and signs of injury present. Normal range of motion.  ?   Cervical back: Normal range of motion and neck supple.  ?   Comments: Minimal edema, tenderness to palpation right medial elbow.  Range  of motion intact but painful.  Grip strength full and equal bilateral hands.  Bilateral lateral lumbar musculature tender to palpation, bilateral lumbar paraspinal muscles tender to palpation.  No midline spinal tend

## 2022-07-17 ENCOUNTER — Encounter: Payer: Self-pay | Admitting: Internal Medicine

## 2022-08-12 ENCOUNTER — Ambulatory Visit: Payer: Medicaid Other | Admitting: Gastroenterology

## 2022-08-12 ENCOUNTER — Encounter: Payer: Self-pay | Admitting: Gastroenterology

## 2022-08-12 ENCOUNTER — Encounter: Payer: Self-pay | Admitting: *Deleted

## 2022-08-12 VITALS — BP 125/71 | HR 66 | Temp 98.2°F | Ht 70.0 in | Wt 255.2 lb

## 2022-08-12 DIAGNOSIS — K5900 Constipation, unspecified: Secondary | ICD-10-CM | POA: Insufficient documentation

## 2022-08-12 DIAGNOSIS — R195 Other fecal abnormalities: Secondary | ICD-10-CM | POA: Insufficient documentation

## 2022-08-12 MED ORDER — CLENPIQ 10-3.5-12 MG-GM -GM/175ML PO SOLN: 1.0000 | ORAL | 0 refills | Status: DC

## 2022-08-12 NOTE — Progress Notes (Signed)
Gastroenterology Office Note    Referring Provider: Caryl Bis, MD Primary Care Physician:  Caryl Bis, MD  Primary GI: Dr. Gala Romney, previously unassigned   Chief Complaint   Chief Complaint  Patient presents with   Blood In Stools    Abdominal pains, bloating, constipation. Sometimes its 7 to 8 days before he has a bowel movement.      History of Present Illness   Derrick Clark is a 61 y.o. male presenting today at the request of Caryl Bis, MD due to heme positive stool and constipation. His last colonoscopy was in 2019 by Dr. Anthony Sar and normal. Family history of colon cancer in father, paternal grandfather, and 2 paternal uncles.   History of chronic constipation. Took Miralax daily without improvement. Had to take BID and finally went. Strained to get it out. Going multiple days without a BM. Hasn't had as long stretches as he is having now. Bloated when constipated. Abdominal pain with constipation. Went about 4am this morning. Tries to eat fiber. No overt GI bleeding. Did have heme positive stool through PCP. No unexplained weight loss or lack of appetite. No dysphagia. Rolaids prn indigestion.     Past Medical History:  Diagnosis Date   Abdominal hernia    Arthritis    Bronchitis    Claustrophobia    COPD (chronic obstructive pulmonary disease) (HCC)    Diabetes mellitus without complication ([place])    Nonhealing surgical wound    nonviable tissue   PAD (peripheral artery disease) ([place])     Past Surgical History:  Procedure Laterality Date   ABDOMINAL AORTOGRAM W/LOWER EXTREMITY N/A 01/31/2019   Procedure: ABDOMINAL AORTOGRAM W/LOWER EXTREMITY;  Surgeon: Waynetta Sandy, MD;  Location: [facility];  Service: Cardiovascular;  Laterality: N/A;   AMPUTATION Right 02/01/2019   Procedure: AMPUTATION RIGHT FIFTH TOE;  Surgeon: Angelia Mould, MD;  Location: [facility];  Service: Vascular;  Laterality: Right;   AMPUTATION Right  02/10/2019   Procedure: AMPUTATION RAY RIGHT 5TH TOE WITH DEBRIDEMENT WOUND BED;  Surgeon: Waynetta Sandy, MD;  Location: [facility];  Service: Vascular;  Laterality: Right;   AMPUTATION TOE Right 02/10/2019   5TH    COLONOSCOPY  2019   Dr. Anthony Sar: normal   PERIPHERAL VASCULAR INTERVENTION Right 01/31/2019   Procedure: PERIPHERAL VASCULAR INTERVENTION;  Surgeon: Waynetta Sandy, MD;  Location: [facility];  Service: Cardiovascular;  Laterality: Right;  SFA   SHOULDER SURGERY Left    TOOTH EXTRACTION N/A 01/31/2022   Procedure: DENTAL RESTORATION/EXTRACTIONS;  Surgeon: Diona Browner, DMD;  Location: [facility];  Service: Oral Surgery;  Laterality: N/A;    Current Outpatient Medications  Medication Sig Dispense Refill   celecoxib (CELEBREX) 200 MG capsule Take 200 mg by mouth daily.     clopidogrel (PLAVIX) 75 MG tablet Take 1 tablet (75 mg total) by mouth daily with breakfast. 30 tablet 2   dapagliflozin propanediol (FARXIGA) 10 MG TABS tablet Take 10 mg by mouth daily.     Exenatide ER (BYDUREON) 2 MG PEN Inject 2 mg as directed once a week.     gabapentin (NEURONTIN) 600 MG tablet Take 600 mg by mouth 3 (three) times daily.  0   glipiZIDE (GLUCOTROL XL) 10 MG 24 hr tablet Take 10 mg by mouth daily.     metFORMIN (GLUCOPHAGE-XR) 500 MG 24 hr tablet Take 1,000 mg by mouth 2 (two) times daily.     sildenafil (  REVATIO) 20 MG tablet Take 20 mg by mouth daily as needed for erectile dysfunction.     simvastatin (ZOCOR) 40 MG tablet Take 40 mg by mouth every evening.      SYMBICORT 160-4.5 MCG/ACT inhaler 2 puffs daily as needed (COPD).     tamsulosin (FLOMAX) 0.4 MG CAPS capsule Take 0.4 mg by mouth at bedtime.     Sod Picosulfate-Mag Ox-Cit Acd (CLENPIQ) 10-3.5-12 MG-GM -GM/175ML SOLN Take 1 kit by mouth as directed. 350 mL 0   No current facility-administered medications for this visit.    Allergies as of 08/12/2022 - Review Complete 08/12/2022  Allergen Reaction Noted    Jardiance [empagliflozin] Rash 07/24/2017   Sulfa antibiotics Rash 07/24/2017    Family History  Problem Relation Age of Onset   Diabetes Mother    Heart failure Mother    Colon cancer Father    Diabetes Brother    Colon cancer Paternal Grandfather    Asthma Son    Healthy Son    Healthy Son    Healthy Son    Healthy Son     Social History   Socioeconomic History   Marital status: Legally Separated    Spouse name: Not on file   Number of children: Not on file   Years of education: Not on file   Highest education level: Not on file  Occupational History   Not on file  Tobacco Use   Smoking status: Every Day    Packs/day: 1.00    Years: 40.00    Total pack years: 40.00    Types: Cigarettes   Smokeless tobacco: Never  Vaping Use   Vaping Use: Never used  Substance and Sexual Activity   Alcohol use: Yes    Comment: Rarely   Drug use: No   Sexual activity: Not Currently    Birth control/protection: None  Other Topics Concern   Not on file  Social History Narrative   Not on file   Social Determinants of Health   Financial Resource Strain: Not on file  Food Insecurity: Not on file  Transportation Needs: Not on file  Physical Activity: Not on file  Stress: Not on file  Social Connections: Not on file  Intimate Partner Violence: Not on file     Review of Systems   Gen: Denies any fever, chills, fatigue, weight loss, lack of appetite.  CV: Denies chest pain, heart palpitations, peripheral edema, syncope.  Resp: Denies shortness of breath at rest or with exertion. Denies wheezing or cough.  GI: see HPI GU : Denies urinary burning, urinary frequency, urinary hesitancy MS: Denies joint pain, muscle weakness, cramps, or limitation of movement.  Derm: Denies rash, itching, dry skin Psych: Denies depression, anxiety, memory loss, and confusion Heme: Denies bruising, bleeding, and enlarged lymph nodes.   Physical Exam   BP 125/71 (BP Location: Right Arm,  Patient Position: Sitting, Cuff Size: Large)   Pulse 66   Temp 98.2 F (36.8 C) (Temporal)   Ht 5' 10"  (1.778 m)   Wt 255 lb 3.2 oz (115.8 kg)   SpO2 95%   BMI 36.62 kg/m  General:   Alert and oriented. Pleasant and cooperative. Well-nourished and well-developed.  Head:  Normocephalic and atraumatic. Eyes:  Without icterus Ears:  Normal auditory acuity. Lungs:  Clear to auscultation bilaterally.  Heart:  S1, S2 present without murmurs appreciated.  Abdomen:  +BS, soft, non-tender and non-distended. No HSM noted. No guarding or rebound. No masses appreciated.  Rectal:  no mass on DRE, no gross blood appreciated, brown stool in rectal vault Msk:  Symmetrical without gross deformities. Normal posture. Extremities:  Without edema. Neurologic:  Alert and  oriented x4;  grossly normal neurologically. Skin:  Intact without significant lesions or rashes. Psych:  Alert and cooperative. Normal mood and affect.   Assessment   Derrick Clark is a 61 y.o. male presenting today at the request of Dr. Quillian Quince due to heme positive stool and constipation. His last colonoscopy was in 2019 by Dr. Anthony Sar and normal. Family history of colon cancer in father, paternal grandfather, and 2 paternal uncles.   He has had no overt GI bleeding, unexplained weight loss, lack of appetite. Chronic constipation has been long-standing but seems to be worsening recently. Rectal exam completed today.  Will optimize bowel regimen with Linzess 290 mcg daily. I have provided samples and asked him to call with update.   Diagnostic colonoscopy in near future due to heme positive stool.    PLAN   Proceed with colonoscopy by Dr. Gala Romney in near future: the risks, benefits, and alternatives have been discussed with the patient in detail. The patient states understanding and desires to proceed. ASA 3  Linzess 290 mcg daily  Further recommendations to follow   Annitta Needs, PhD, ANP-BC [facility]

## 2022-08-12 NOTE — Patient Instructions (Addendum)
We are arranging a colonoscopy with Dr. Gala Romney in the near future.  You can continue Plavix!  Please stop Farxiga 72 hours prior. No metformin or glipizide the day of the procedure.  Start taking Linzess 1 capsule 30 minutes before breakfast daily. It is normal to have some looser stool starting out for the first few days, but this should improve. If it does not, please call us, as we will need to adjust the dosage. Let us know how this is working for you, so we can make sure to get you on the best regimen for constipation!  It was a pleasure to see you today. I want to create trusting relationships with patients to provide genuine, compassionate, and quality care. I value your feedback. If you receive a survey regarding your visit,  I greatly appreciate you taking time to fill this out.   Annitta Needs, PhD, ANP-BC [facility]

## 2022-08-13 ENCOUNTER — Encounter: Payer: Self-pay | Admitting: *Deleted

## 2022-09-11 ENCOUNTER — Telehealth: Payer: Self-pay | Admitting: Internal Medicine

## 2022-09-11 NOTE — Telephone Encounter (Signed)
LMOVM advising will cancel procedure for 9/22 with Dr. Gala Romney and will call to reschedule once we get Nov/Dec schedule

## 2022-09-11 NOTE — Telephone Encounter (Signed)
Pt's wife, Kieth Brightly, called to verify her OV for today and then said patient (her husband) needed to reschedule his colonoscopy with [facility] for 09/19/2022, [phone]

## 2022-09-17 ENCOUNTER — Encounter (HOSPITAL_COMMUNITY): Payer: Medicaid Other

## 2022-09-19 ENCOUNTER — Encounter (HOSPITAL_COMMUNITY): Admission: RE | Payer: Self-pay | Source: Ambulatory Visit

## 2022-09-19 ENCOUNTER — Ambulatory Visit (HOSPITAL_COMMUNITY): Admission: RE | Admit: 2022-09-19 | Payer: Medicaid Other | Source: Ambulatory Visit | Admitting: Internal Medicine

## 2022-09-19 SURGERY — COLONOSCOPY WITH PROPOFOL
Anesthesia: Monitor Anesthesia Care

## 2022-10-01 ENCOUNTER — Telehealth: Payer: Self-pay | Admitting: *Deleted

## 2022-10-01 NOTE — Telephone Encounter (Signed)
Called pt, no answer and no VM. Needs to schedule for TCS with Dr. Gala Romney, ASA 3

## 2022-10-14 NOTE — Telephone Encounter (Signed)
LMOVM to call back. Letter mailed. 

## 2023-04-13 ENCOUNTER — Other Ambulatory Visit (HOSPITAL_COMMUNITY): Payer: Self-pay | Admitting: Family Medicine

## 2023-04-13 DIAGNOSIS — F17200 Nicotine dependence, unspecified, uncomplicated: Secondary | ICD-10-CM

## 2023-04-13 DIAGNOSIS — F17209 Nicotine dependence, unspecified, with unspecified nicotine-induced disorders: Secondary | ICD-10-CM

## 2023-05-28 ENCOUNTER — Ambulatory Visit (HOSPITAL_COMMUNITY): Admission: RE | Admit: 2023-05-28 | Payer: Medicaid Other | Source: Ambulatory Visit

## 2023-06-05 ENCOUNTER — Ambulatory Visit (HOSPITAL_COMMUNITY)
Admission: RE | Admit: 2023-06-05 | Discharge: 2023-06-05 | Disposition: A | Discharge status: Home / Self Care | Payer: Medicaid Other | Source: Ambulatory Visit | Attending: Family Medicine | Admitting: Family Medicine

## 2023-06-05 DIAGNOSIS — F17209 Nicotine dependence, unspecified, with unspecified nicotine-induced disorders: Secondary | ICD-10-CM | POA: Diagnosis present

## 2023-06-05 DIAGNOSIS — F17200 Nicotine dependence, unspecified, uncomplicated: Secondary | ICD-10-CM | POA: Diagnosis present

## 2023-10-24 ENCOUNTER — Other Ambulatory Visit: Payer: Self-pay | Admitting: Family Medicine

## 2024-04-13 ENCOUNTER — Other Ambulatory Visit: Payer: Self-pay

## 2024-04-13 ENCOUNTER — Encounter: Payer: Self-pay | Admitting: Neurology

## 2024-04-13 DIAGNOSIS — R202 Paresthesia of skin: Secondary | ICD-10-CM

## 2024-05-09 ENCOUNTER — Ambulatory Visit: Admitting: Neurology

## 2024-05-09 DIAGNOSIS — G5603 Carpal tunnel syndrome, bilateral upper limbs: Secondary | ICD-10-CM

## 2024-05-09 DIAGNOSIS — R202 Paresthesia of skin: Secondary | ICD-10-CM | POA: Diagnosis not present

## 2024-05-09 DIAGNOSIS — M5412 Radiculopathy, cervical region: Secondary | ICD-10-CM

## 2024-05-09 NOTE — Procedures (Signed)
 [facility]  [address] [place], [address]  [place], [place] [address] Tel: [phone] Fax: [phone] Test Date:  05/09/2024  Patient: Derrick Clark DOB: 14-Jan-1961 Physician: Rommie Coats, MD  Sex: Male Height: 5\' 10"  Ref Phys: Patra Bonnet, MD  ID#: [id number]   Technician:    History: This is a 63 year old male with numbness and pain in his arms and hands.  NCV & EMG Findings: Extensive electrodiagnostic evaluation of bilateral upper limbs shows: Bilateral median sensory responses are absent. Bilateral ulnar and radial sensory responses are within normal limits. Right median (APB) motor response is absent. Left median (APB) motor response shows prolonged distal onset latency (7.9 ms) and reduced amplitude (4.8 mV). Bilateral ulnar (ADM) motor responses are within normal limits. There are no motor units and atrophy seen in the right abductor pollicis brevis muscle. Chronic motor axon loss changes without accompanying active denervation changes seen in the left triceps, left pronator teres, bilateral biceps, bilateral deltoid, and left cervical paraspinal (C7 level) muscles.  Impression: This is an abnormal study. The findings are most consistent with the following: Bilateral median mononeuropathy at or distal to the wrist, consistent with carpal tunnel syndrome, severe in degree electrically on the left and very severe in degree electrically on the right. The residuals of old intraspinal canal lesions (ie: motor radiculopathy) at bilateral C5 and left C7 roots or segments. The findings are mild in degree electrically at bilateral C5 roots and moderate in degree electrically at the left C7 root.    ___________________________ Rommie Coats, MD    Nerve Conduction Studies Motor Nerve Results    Latency Amplitude F-Lat Segment Distance CV Comment  Site (ms) Norm (mV) Norm (ms)  (cm) (m/s) Norm   Left Median (APB) Motor  Wrist *7.9  < 4.0 *4.8  > 5.0        Elbow 14.0 -  4.2 -  Elbow-Wrist 31 51  > 50   Right Median (APB) Motor  Wrist *NR  < 4.0 *NR  > 5.0        Elbow *NR - *NR -  Elbow-Wrist 30 *NR  > 50   Left Ulnar (ADM) Motor  Wrist 1.95  < 3.1 7.7  > 7.0        Bel elbow 6.1 - 7.0 -  Bel elbow-Wrist 23 55  > 50   Ab elbow 7.9 - 6.4 -  Ab elbow-Bel elbow 10 56 -   Right Ulnar (ADM) Motor  Wrist 2.1  < 3.1 7.5  > 7.0        Bel elbow 6.2 - 6.6 -  Bel elbow-Wrist 23 56  > 50   Ab elbow 8.2 - 6.6 -  Ab elbow-Bel elbow 10 50 -    Sensory Sites    Neg Peak Lat Amplitude (O-P) Segment Distance Velocity Comment  Site (ms) Norm (V) Norm  (cm) (ms)   Left Median Sensory  Wrist-Dig II *NR  < 3.8 *NR  > 10 Wrist-Dig II 13    Right Median Sensory  Wrist-Dig II *NR  < 3.8 *NR  > 10 Wrist-Dig II 13    Left Radial Sensory  Forearm-Wrist 1.70  < 2.8 10  > 10 Forearm-Wrist 10    Right Radial Sensory  Forearm-Wrist 2.2  < 2.8 10  > 10 Forearm-Wrist 10    Left Ulnar Sensory  Wrist-Dig V 2.9  < 3.2 11  > 5 Wrist-Dig V 11    Right Ulnar Sensory  Wrist-Dig V 2.9  < 3.2 11  > 5 Wrist-Dig V 11     Electromyography   Side Muscle Ins.Act Fibs Fasc Recrt Amp Dur Poly Activation Comment  Right C7 PSP Nml Nml Nml Nml Nml Nml Nml Nml N/A  Right FDI Nml Nml Nml Nml Nml Nml Nml Nml N/A  Right Triceps Nml Nml Nml Nml Nml Nml Nml Nml N/A  Right EIP Nml Nml Nml Nml Nml Nml Nml Nml N/A  Right Pronator teres Nml Nml Nml Nml Nml Nml Nml Nml N/A  Right APB *1- Nml Nml *None *- *- *- *2- *ATR  Right Biceps Nml Nml Nml *1- *1+ *1+ Nml Nml N/A  Right Deltoid Nml Nml Nml *1- *1+ *1+ Nml Nml N/A  Left APB Nml Nml Nml Nml Nml Nml Nml Nml N/A  Left FDI Nml Nml Nml Nml Nml Nml Nml Nml N/A  Left EIP Nml Nml Nml Nml Nml Nml Nml Nml N/A  Left Pronator teres Nml Nml Nml *1- *1+ *1+ Nml Nml N/A  Left Biceps Nml Nml Nml *1- *1+ *1+ Nml Nml N/A  Left Triceps Nml Nml Nml *2- *1+ *1+ Nml Nml N/A  Left Deltoid Nml Nml Nml *1- *1+ *1+ Nml Nml N/A  Left C7 PSP Nml Nml Nml *1- *1+ *1+ Nml  Nml N/A      Waveforms:  Motor           Sensory

## 2024-09-19 ENCOUNTER — Encounter: Payer: Self-pay | Admitting: Emergency Medicine

## 2024-09-19 ENCOUNTER — Ambulatory Visit
Admission: EM | Admit: 2024-09-19 | Discharge: 2024-09-19 | Disposition: A | Discharge status: Home / Self Care | Attending: Nurse Practitioner | Admitting: Nurse Practitioner

## 2024-09-19 ENCOUNTER — Other Ambulatory Visit: Payer: Self-pay

## 2024-09-19 DIAGNOSIS — T1592XA Foreign body on external eye, part unspecified, left eye, initial encounter: Secondary | ICD-10-CM

## 2024-09-19 MED ORDER — POLYMYXIN B-TRIMETHOPRIM 10000-0.1 UNIT/ML-% OP SOLN: 1.0000 [drp] | Freq: Four times a day (QID) | OPHTHALMIC | 0 refills | Status: AC

## 2024-09-19 NOTE — ED Provider Notes (Signed)
 [facility]    CSN: [id number] Arrival date & time: 09/19/24  1326      History   Chief Complaint Chief Complaint  Patient presents with   Eye Problem    HPI Derrick Clark is a 63 y.o. male.   The history is provided by the patient.   Patient presents with a 2-day history of a foreign object in the left eye.  He states that there is a black spot on the outside of the iris of his eye.  He complains of mild pain, tearing and mild visual changes.  He denies fever, chills, drainage from the eye, swelling, light sensitivity, or loss of vision.  States he has attempted to rinse the eye with minimal relief of his symptoms.  Also states that he used over-the-counter eyedrops.  Denies use of contacts or eyeglasses. Past Medical History:  Diagnosis Date   Abdominal hernia    Arthritis    Bronchitis    Claustrophobia    COPD (chronic obstructive pulmonary disease) (HCC)    Diabetes mellitus without complication (HCC)    Nonhealing surgical wound    nonviable tissue   PAD (peripheral artery disease)     Patient Active Problem List   Diagnosis Date Noted   Heme positive stool 08/12/2022   Constipation 08/12/2022   Seropositive rheumatoid arthritis (HCC) 03/06/2021   High risk medication use 03/06/2021   Joint swelling 02/22/2021   Osteoarthritis of both knees 02/22/2021   Generalized osteoarthritis of multiple sites 02/22/2021   Shoulder tendonitis 02/22/2021   Diastasis recti 07/24/2020   Umbilical hernia without obstruction and without gangrene 07/24/2020   Impingement syndrome of right shoulder 11/02/2019   PAD (peripheral artery disease) 02/10/2019   Displacement of lumbar intervertebral disc without myelopathy 10/01/2018   Disorder of adrenal gland 06/07/2018   Adhesive capsulitis of left shoulder 06/02/2018   Sciatica 05/21/2018   Impingement syndrome of left shoulder region 10/21/2017   Bursitis/tendonitis, shoulder 09/16/2017   Inflammatory and toxic  neuropathy (HCC) 12/15/2016   Major depressive disorder, single episode, in remission (HCC) 01/24/2015   Dysmetabolic syndrome X 09/10/2014   Mixed hyperlipidemia 09/05/2014   Tobacco use disorder 09/05/2014   Type 2 (non-insulin  dependent type) or unspecified type diabetes mellitus with neurological manifestations, uncontrolled 09/05/2014   Balanoposthitis 01/01/2013    Past Surgical History:  Procedure Laterality Date   ABDOMINAL AORTOGRAM W/LOWER EXTREMITY N/A 01/31/2019   Procedure: ABDOMINAL AORTOGRAM W/LOWER EXTREMITY;  Surgeon: Sheree Penne Bruckner, MD;  Location: [facility] INVASIVE CV LAB;  Service: Cardiovascular;  Laterality: N/A;   AMPUTATION Right 02/01/2019   Procedure: AMPUTATION RIGHT FIFTH TOE;  Surgeon: Eliza Bruckner RAMAN, MD;  Location: [facility] OR;  Service: Vascular;  Laterality: Right;   AMPUTATION Right 02/10/2019   Procedure: AMPUTATION RAY RIGHT 5TH TOE WITH DEBRIDEMENT WOUND BED;  Surgeon: Sheree Penne Bruckner, MD;  Location: [facility] OR;  Service: Vascular;  Laterality: Right;   AMPUTATION TOE Right 02/10/2019   5TH    CARPAL TUNNEL RELEASE Left    COLONOSCOPY  2019   Dr. Ivery: normal   PERIPHERAL VASCULAR INTERVENTION Right 01/31/2019   Procedure: PERIPHERAL VASCULAR INTERVENTION;  Surgeon: Sheree Penne Bruckner, MD;  Location: [facility] INVASIVE CV LAB;  Service: Cardiovascular;  Laterality: Right;  SFA   SHOULDER SURGERY Left    TOOTH EXTRACTION N/A 01/31/2022   Procedure: DENTAL RESTORATION/EXTRACTIONS;  Surgeon: Sheryle Hamilton, DMD;  Location: [facility] OR;  Service: Oral Surgery;  Laterality: N/A;       Home  Medications    Prior to Admission medications   Medication Sig Start Date End Date Taking? Authorizing Provider  trimethoprim -polymyxin b  (POLYTRIM ) ophthalmic solution Place 1 drop into the left eye every 6 (six) hours for 7 days. 09/19/24 09/26/24 Yes Leath-Warren, Etta PARAS, NP  celecoxib (CELEBREX) 200 MG capsule Take 200 mg by mouth daily. 12/06/20    [provider]  clopidogrel  (PLAVIX ) 75 MG tablet Take 1 tablet (75 mg total) by mouth daily with breakfast. 02/02/19   Gerome Maurilio HERO, PA-C  dapagliflozin propanediol (FARXIGA) 10 MG TABS tablet Take 10 mg by mouth daily.    [provider]  Exenatide ER (BYDUREON) 2 MG PEN Inject 2 mg as directed once a week. 03/17/20   [provider]  gabapentin  (NEURONTIN ) 600 MG tablet Take 600 mg by mouth 3 (three) times daily. 04/17/17   [provider]  glipiZIDE  (GLUCOTROL  XL) 10 MG 24 hr tablet Take 10 mg by mouth daily.    [provider]  metFORMIN  (GLUCOPHAGE -XR) 500 MG 24 hr tablet Take 1,000 mg by mouth 2 (two) times daily. 01/11/19   [provider]  sildenafil (REVATIO) 20 MG tablet Take 20 mg by mouth daily as needed for erectile dysfunction. 12/04/21   [provider]  simvastatin  (ZOCOR ) 40 MG tablet Take 40 mg by mouth every evening.     [provider]  Sod Picosulfate-Mag Ox-Cit Acd (CLENPIQ ) 10-3.5-12 MG-GM -GM/175ML SOLN Take 1 kit by mouth as directed. 08/12/22   Shaaron Lamar HERO, MD  SYMBICORT 160-4.5 MCG/ACT inhaler 2 puffs daily as needed (COPD). 07/16/21   [provider]  tamsulosin (FLOMAX) 0.4 MG CAPS capsule Take 0.4 mg by mouth at bedtime. 08/31/21   [provider]    Family History Family History  Problem Relation Age of Onset   Diabetes Mother    Heart failure Mother    Colon cancer Father    Diabetes Brother    Colon cancer Paternal Grandfather    Asthma Son    Healthy Son    Healthy Son    Healthy Son    Healthy Son     Social History Social History   Tobacco Use   Smoking status: Every Day    Current packs/day: 0.75    Average packs/day: 0.8 packs/day for 40.0 years (30.0 ttl pk-yrs)    Types: Cigarettes   Smokeless tobacco: Never  Vaping Use   Vaping status: Never Used  Substance Use Topics   Alcohol use: Yes    Comment: Rarely   Drug use: No     Allergies    Jardiance [empagliflozin] and Sulfa antibiotics   Review of Systems Review of Systems Per HPI  Physical Exam Triage Vital Signs ED Triage Vitals  Encounter Vitals Group     BP 09/19/24 1540 137/86     Girls Systolic BP Percentile --      Girls Diastolic BP Percentile --      Boys Systolic BP Percentile --      Boys Diastolic BP Percentile --      Pulse Rate 09/19/24 1540 64     Resp 09/19/24 1540 20     Temp 09/19/24 1540 97.8 F (36.6 C)     Temp Source 09/19/24 1540 Oral     SpO2 09/19/24 1540 97 %     Weight --      Height --      Head Circumference --      Peak Flow --  Pain Score 09/19/24 1537 2     Pain Loc --      Pain Education --      Exclude from Growth Chart --    No data found.  Updated Vital Signs BP 137/86 (BP Location: Right Arm)   Pulse 64   Temp 97.8 F (36.6 C) (Oral)   Resp 20   SpO2 97%   Visual Acuity Right Eye Distance: 20/15 Left Eye Distance: 20/20 Bilateral Distance: 20/13  Right Eye Near:   Left Eye Near:    Bilateral Near:     Physical Exam Vitals and nursing note reviewed.  Constitutional:      General: He is not in acute distress.    Appearance: Normal appearance.  HENT:     Head: Normocephalic.     Nose: Nose normal.  Eyes:     General: Lids are normal. Vision grossly intact. No visual field deficit.    Extraocular Movements: Extraocular movements intact.     Left eye: Normal extraocular motion and no nystagmus.     Conjunctiva/sclera:     Left eye: Left conjunctiva is injected. No chemosis, exudate or hemorrhage.    Pupils: Pupils are equal, round, and reactive to light.     Comments: Foreign body present at the 9 o'clock position of the left iris.  Mild injection is present.  Eye Exam: Eyelids everted and swept for foreign body. The eye was anesthetized with 2 drops of Tetracaine and stained with fluorescein. Q-tip used to remove foreign object with success. The eye was then irrigated copiously with saline.  Patient tolerated the procedure well.   Pulmonary:     Effort: Pulmonary effort is normal.  Musculoskeletal:     Cervical back: Normal range of motion.  Skin:    General: Skin is warm and dry.  Neurological:     General: No focal deficit present.     Mental Status: He is alert and oriented to person, place, and time.  Psychiatric:        Mood and Affect: Mood normal.        Behavior: Behavior normal.      UC Treatments / Results  Labs (all labs ordered are listed, but only abnormal results are displayed) Labs Reviewed - No data to display  EKG   Radiology No results found.  Procedures Foreign Body Removal  Date/Time: 09/19/2024 3:57 PM  Performed by: Gilmer Etta PARAS, NP Authorized by: Gilmer Etta PARAS, NP   Consent:    Consent obtained:  Verbal   Consent given by:  Patient   Risks discussed:  Incomplete removal, pain and infection   Alternatives discussed:  No treatment Universal protocol:    Procedure explained and questions answered to patient or proxy's satisfaction: yes     Patient identity confirmed:  Verbally with patient Location:    Location:  Face (left eye) Anesthesia:    Anesthesia method:  Topical application   Topical anesthesia: Tetracaine drops. Procedure details:    Localization method:  Visualized   Foreign bodies recovered:  1   Intact foreign body removal: yes   Post-procedure details:    Confirmation:  No additional foreign bodies on visualization Comments:     Eyelids everted and swept for foreign body. The eye was anesthetized with 2 drops of Tetracaine and stained with fluorescein. Q-tip used to remove foreign object with success. The eye was then irrigated copiously with saline. Patient tolerated the procedure well.   (including critical care time)  Medications  Ordered in UC Medications - No data to display  Initial Impression / Assessment and Plan / UC Course  I have reviewed the triage vital signs and the nursing  notes.  Pertinent labs & imaging results that were available during my care of the patient were reviewed by me and considered in my medical decision making (see chart for details).  Small foreign body removed from the iris of the left eye.  Will start patient on Polytrim  eyedrops to cover for possible corneal abrasion.  Supportive care recommendations were provided and discussed with the patient to include over-the-counter analgesics, warm compresses, and to continue use of over-the-counter eyedrops as needed.  Discussed indications with patient regarding follow-up with his eye doctor.  Patient was in agreement with this plan of care and verbalizes understanding.  All questions were answered.  Patient stable for discharge.   Final Clinical Impressions(s) / UC Diagnoses   Final diagnoses:  Foreign body, eye, left, initial encounter     Discharge Instructions      Use eyedrops as prescribed.   Apply warm compresses to the left eye as needed to help with pain or discomfort.  Cool compresses to the eyes to help with pain or swelling. May continue the over-the-counter eyedrops you are using to help keep the eyes moist and decreased redness. Strict handwashing when applying medication.  Avoid rubbing or manipulating the eyes while symptoms persist. If symptoms fail to improve, please follow-up with your eye doctor for further evaluation. Follow-up as needed.     ED Prescriptions     Medication Sig Dispense Auth. Provider   trimethoprim -polymyxin b  (POLYTRIM ) ophthalmic solution Place 1 drop into the left eye every 6 (six) hours for 7 days. 10 mL Leath-Warren, Etta PARAS, NP      PDMP not reviewed this encounter.   Gilmer Etta PARAS, NP 09/19/24 1601

## 2024-09-19 NOTE — ED Triage Notes (Signed)
 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 Pt reports watery drainage for last several days. Has tried dry eye drops with no change in symptoms. Denies any visual changes. Denies requiring corrective lenses at baseline.

## 2024-09-19 NOTE — Discharge Instructions (Signed)
 Use eyedrops as prescribed.   Apply warm compresses to the left eye as needed to help with pain or discomfort.  Cool compresses to the eyes to help with pain or swelling. May continue the over-the-counter eyedrops you are using to help keep the eyes moist and decreased redness. Strict handwashing when applying medication.  Avoid rubbing or manipulating the eyes while symptoms persist. If symptoms fail to improve, please follow-up with your eye doctor for further evaluation. Follow-up as needed.

## 2024-09-22 ENCOUNTER — Other Ambulatory Visit (HOSPITAL_COMMUNITY): Payer: Self-pay | Admitting: Family Medicine

## 2024-09-22 ENCOUNTER — Encounter: Payer: Self-pay | Admitting: Gastroenterology

## 2024-09-22 DIAGNOSIS — F17210 Nicotine dependence, cigarettes, uncomplicated: Secondary | ICD-10-CM

## 2024-09-28 ENCOUNTER — Ambulatory Visit: Admitting: Gastroenterology

## 2024-09-29 ENCOUNTER — Encounter: Payer: Self-pay | Admitting: Gastroenterology

## 2024-10-09 ENCOUNTER — Ambulatory Visit (HOSPITAL_COMMUNITY): Admission: RE | Admit: 2024-10-09 | Source: Ambulatory Visit

## 2024-10-09 ENCOUNTER — Encounter (HOSPITAL_COMMUNITY): Payer: Self-pay

## 2024-11-13 ENCOUNTER — Ambulatory Visit
Admission: EM | Admit: 2024-11-13 | Discharge: 2024-11-13 | Disposition: A | Discharge status: Home / Self Care | Attending: Family Medicine | Admitting: Family Medicine

## 2024-11-13 DIAGNOSIS — R1024 Suprapubic pain: Secondary | ICD-10-CM

## 2024-11-13 DIAGNOSIS — R300 Dysuria: Secondary | ICD-10-CM

## 2024-11-13 LAB — POCT URINE DIPSTICK
Bilirubin, UA: NEGATIVE
Blood, UA: NEGATIVE
Glucose, UA: >=1000 mg/dL — AB
Ketones, POC UA: NEGATIVE mg/dL
Leukocytes, UA: NEGATIVE
Nitrite, UA: NEGATIVE
POC PROTEIN,UA: NEGATIVE
Spec Grav, UA: 1.015 (ref 1.010–1.025)
Urobilinogen, UA: 0.2 U/dL
pH, UA: 7 (ref 5.0–8.0)

## 2024-11-13 NOTE — ED Triage Notes (Signed)
 Pt reports lower back pain, urine is bright orange, pain and pressure in the abdomen burning with urination started Friday.

## 2024-11-13 NOTE — ED Provider Notes (Signed)
 [facility]    CSN: [id number] Arrival date & time: 11/13/24  1224      History   Chief Complaint No chief complaint on file.   HPI Derrick Clark is a 63 y.o. male.   Patient presenting today with 2-day history of lower back pain bilaterally, 1 episode of bright orange urine on day of onset, lower abdominal discomfort, dysuria.  States the color has not returned to normal and his symptoms are somewhat improving but overall still persisting.  Denies fever, chills, nausea, vomiting, bowel changes, new medications or supplements, dietary changes.  History of diabetes, of note does take Farxiga.  States his blood sugars have been under good control and at his baseline.    Past Medical History:  Diagnosis Date   Abdominal hernia    Arthritis    Bronchitis    Claustrophobia    COPD (chronic obstructive pulmonary disease) (HCC)    Diabetes mellitus without complication (HCC)    Nonhealing surgical wound    nonviable tissue   PAD (peripheral artery disease)     Patient Active Problem List   Diagnosis Date Noted   Heme positive stool 08/12/2022   Constipation 08/12/2022   Seropositive rheumatoid arthritis (HCC) 03/06/2021   High risk medication use 03/06/2021   Joint swelling 02/22/2021   Osteoarthritis of both knees 02/22/2021   Generalized osteoarthritis of multiple sites 02/22/2021   Shoulder tendonitis 02/22/2021   Diastasis recti 07/24/2020   Umbilical hernia without obstruction and without gangrene 07/24/2020   Impingement syndrome of right shoulder 11/02/2019   PAD (peripheral artery disease) 02/10/2019   Displacement of lumbar intervertebral disc without myelopathy 10/01/2018   Disorder of adrenal gland 06/07/2018   Adhesive capsulitis of left shoulder 06/02/2018   Sciatica 05/21/2018   Impingement syndrome of left shoulder region 10/21/2017   Bursitis/tendonitis, shoulder 09/16/2017   Inflammatory and toxic neuropathy 12/15/2016   Major depressive  disorder, single episode, in remission 01/24/2015   Dysmetabolic syndrome X 09/10/2014   Mixed hyperlipidemia 09/05/2014   Tobacco use disorder 09/05/2014   Type 2 (non-insulin  dependent type) or unspecified type diabetes mellitus with neurological manifestations, uncontrolled 09/05/2014   Balanoposthitis 01/01/2013    Past Surgical History:  Procedure Laterality Date   ABDOMINAL AORTOGRAM W/LOWER EXTREMITY N/A 01/31/2019   Procedure: ABDOMINAL AORTOGRAM W/LOWER EXTREMITY;  Surgeon: Sheree Penne Bruckner, MD;  Location: [facility] INVASIVE CV LAB;  Service: Cardiovascular;  Laterality: N/A;   AMPUTATION Right 02/01/2019   Procedure: AMPUTATION RIGHT FIFTH TOE;  Surgeon: Eliza Bruckner RAMAN, MD;  Location: [facility] OR;  Service: Vascular;  Laterality: Right;   AMPUTATION Right 02/10/2019   Procedure: AMPUTATION RAY RIGHT 5TH TOE WITH DEBRIDEMENT WOUND BED;  Surgeon: Sheree Penne Bruckner, MD;  Location: [facility] OR;  Service: Vascular;  Laterality: Right;   AMPUTATION TOE Right 02/10/2019   5TH    CARPAL TUNNEL RELEASE Left    COLONOSCOPY  2019   Dr. Ivery: normal   PERIPHERAL VASCULAR INTERVENTION Right 01/31/2019   Procedure: PERIPHERAL VASCULAR INTERVENTION;  Surgeon: Sheree Penne Bruckner, MD;  Location: [facility] INVASIVE CV LAB;  Service: Cardiovascular;  Laterality: Right;  SFA   SHOULDER SURGERY Left    TOOTH EXTRACTION N/A 01/31/2022   Procedure: DENTAL RESTORATION/EXTRACTIONS;  Surgeon: Sheryle Hamilton, DMD;  Location: [facility] OR;  Service: Oral Surgery;  Laterality: N/A;       Home Medications    Prior to Admission medications   Medication Sig Start Date End Date Taking? Authorizing Provider  celecoxib (CELEBREX)  200 MG capsule Take 200 mg by mouth daily. 12/06/20   [provider]  clopidogrel  (PLAVIX ) 75 MG tablet Take 1 tablet (75 mg total) by mouth daily with breakfast. 02/02/19   Gerome Maurilio HERO, PA-C  dapagliflozin propanediol (FARXIGA) 10 MG TABS tablet Take 10 mg by mouth  daily.    [provider]  Exenatide ER (BYDUREON) 2 MG PEN Inject 2 mg as directed once a week. 03/17/20   [provider]  gabapentin  (NEURONTIN ) 600 MG tablet Take 600 mg by mouth 3 (three) times daily. 04/17/17   [provider]  glipiZIDE  (GLUCOTROL  XL) 10 MG 24 hr tablet Take 10 mg by mouth daily.    [provider]  metFORMIN  (GLUCOPHAGE -XR) 500 MG 24 hr tablet Take 1,000 mg by mouth 2 (two) times daily. 01/11/19   [provider]  sildenafil (REVATIO) 20 MG tablet Take 20 mg by mouth daily as needed for erectile dysfunction. 12/04/21   [provider]  simvastatin  (ZOCOR ) 40 MG tablet Take 40 mg by mouth every evening.     [provider]  Sod Picosulfate-Mag Ox-Cit Acd (CLENPIQ ) 10-3.5-12 MG-GM -GM/175ML SOLN Take 1 kit by mouth as directed. 08/12/22   Shaaron Lamar HERO, MD  SYMBICORT 160-4.5 MCG/ACT inhaler 2 puffs daily as needed (COPD). 07/16/21   [provider]  tamsulosin (FLOMAX) 0.4 MG CAPS capsule Take 0.4 mg by mouth at bedtime. 08/31/21   [provider]    Family History Family History  Problem Relation Age of Onset   Diabetes Mother    Heart failure Mother    Colon cancer Father    Diabetes Brother    Colon cancer Paternal Grandfather    Asthma Son    Healthy Son    Healthy Son    Healthy Son    Healthy Son     Social History Social History   Tobacco Use   Smoking status: Every Day    Current packs/day: 0.75    Average packs/day: 0.8 packs/day for 40.0 years (30.0 ttl pk-yrs)    Types: Cigarettes   Smokeless tobacco: Never  Vaping Use   Vaping status: Never Used  Substance Use Topics   Alcohol use: Yes    Comment: Rarely   Drug use: No     Allergies   Jardiance [empagliflozin] and Sulfa antibiotics   Review of Systems Review of Systems PER HPI  Physical Exam Triage Vital Signs ED Triage Vitals  Encounter Vitals Group     BP 11/13/24 1247 131/75     Girls Systolic BP  Percentile --      Girls Diastolic BP Percentile --      Boys Systolic BP Percentile --      Boys Diastolic BP Percentile --      Pulse Rate 11/13/24 1247 71     Resp 11/13/24 1247 18     Temp 11/13/24 1247 98.2 F (36.8 C)     Temp Source 11/13/24 1247 Oral     SpO2 11/13/24 1247 91 %     Weight --      Height --      Head Circumference --      Peak Flow --      Pain Score 11/13/24 1250 7     Pain Loc --      Pain Education --      Exclude from Growth Chart --    No data found.  Updated Vital Signs BP 131/75 (BP Location: Right  Arm)   Pulse 71   Temp 98.2 F (36.8 C) (Oral)   Resp 18   SpO2 91%   Visual Acuity Right Eye Distance:   Left Eye Distance:   Bilateral Distance:    Right Eye Near:   Left Eye Near:    Bilateral Near:     Physical Exam Vitals and nursing note reviewed.  Constitutional:      Appearance: Normal appearance.  HENT:     Head: Atraumatic.  Eyes:     Extraocular Movements: Extraocular movements intact.     Conjunctiva/sclera: Conjunctivae normal.  Cardiovascular:     Rate and Rhythm: Normal rate.  Pulmonary:     Effort: Pulmonary effort is normal.  Abdominal:     General: Bowel sounds are normal. There is no distension.     Palpations: Abdomen is soft.     Tenderness: There is no abdominal tenderness. There is no right CVA tenderness, left CVA tenderness or guarding.  Musculoskeletal:        General: Normal range of motion.     Cervical back: Normal range of motion and neck supple.  Skin:    General: Skin is warm and dry.  Neurological:     Mental Status: He is oriented to person, place, and time.  Psychiatric:        Mood and Affect: Mood normal.        Thought Content: Thought content normal.        Judgment: Judgment normal.      UC Treatments / Results  Labs (all labs ordered are listed, but only abnormal results are displayed) Labs Reviewed  POCT URINE DIPSTICK - Abnormal; Notable for the following components:       Result Value   Glucose, UA >=1,000 (*)    All other components within normal limits    EKG   Radiology No results found.  Procedures Procedures (including critical care time)  Medications Ordered in UC Medications - No data to display  Initial Impression / Assessment and Plan / UC Course  I have reviewed the triage vital signs and the nursing notes.  Pertinent labs & imaging results that were available during my care of the patient were reviewed by me and considered in my medical decision making (see chart for details).     Urinalysis today with no concerning features, positive for glucose secondary to Farxiga but otherwise no abnormalities.  Discussed importance of good hydration, keeping blood sugars stable and avoiding sweetened or caffeinated beverages as they can be bladder irritants.  Follow-up with PCP for recheck if worsening or not resolving.  Final Clinical Impressions(s) / UC Diagnoses   Final diagnoses:  Dysuria  Suprapubic pressure     Discharge Instructions      Your urine test today does not show any evidence of blood, protein, bacteria or anything else concerning.  Ensure that you stay well-hydrated, keep blood sugars under optimal control and follow-up with your primary care if not resolving.  Return sooner if worsening at any time.    ED Prescriptions   None    PDMP not reviewed this encounter.   Stuart Vernell Norris, [place] 11/13/24 1352

## 2024-11-13 NOTE — Discharge Instructions (Signed)
 Your urine test today does not show any evidence of blood, protein, bacteria or anything else concerning.  Ensure that you stay well-hydrated, keep blood sugars under optimal control and follow-up with your primary care if not resolving.  Return sooner if worsening at any time.

## 2024-12-06 ENCOUNTER — Telehealth: Payer: Self-pay | Admitting: *Deleted

## 2024-12-06 ENCOUNTER — Ambulatory Visit: Admitting: Gastroenterology

## 2024-12-06 ENCOUNTER — Encounter: Payer: Self-pay | Admitting: Gastroenterology

## 2024-12-06 VITALS — BP 119/68 | HR 67 | Temp 98.2°F | Ht 70.0 in | Wt 272.0 lb

## 2024-12-06 DIAGNOSIS — Z800 Family history of malignant neoplasm of digestive organs: Secondary | ICD-10-CM

## 2024-12-06 DIAGNOSIS — K5900 Constipation, unspecified: Secondary | ICD-10-CM

## 2024-12-06 DIAGNOSIS — R195 Other fecal abnormalities: Secondary | ICD-10-CM | POA: Diagnosis not present

## 2024-12-06 MED ORDER — LINACLOTIDE 290 MCG PO CAPS: 290.0000 ug | ORAL_CAPSULE | Freq: Every day | ORAL | 5 refills | Status: AC

## 2024-12-06 NOTE — Telephone Encounter (Signed)
  Request for patient to stop medication prior to procedure or is needing cleareance  12/06/24  Derrick Clark [birth year]  What type of surgery is being performed? Colonoscopy   When is surgery scheduled? TBD  What type of clearance is required (medical or pharmacy to hold medication or both? medication  Are there any medications that need to be held prior to surgery and how long? Plavix  x 5 days  Name of physician performing surgery?  Dr. Lamar Shaaron [facility] Phone: [phone], option 5 Fax: [phone]  Anesthesia type (none, local, MAC, general)? MAC   ? Yes ? No Patient can hold medication as requested   Signature: ___________________________

## 2024-12-06 NOTE — Progress Notes (Unsigned)
 Gastroenterology Office Note     Primary Care Physician:  Toribio Jerel MATSU, MD  Primary Gastroenterologist: Dr. Shaaron    Chief Complaint   Chief Complaint  Patient presents with   Follow-up    Follow up on constipation. Pt also complained about his urine (which I advised he may be referred out for that)     History of Present Illness   Derrick Clark is a 63 y.o. male presenting today with a history of heme positive stool, constipation, last colonoscopy in 2019 by Dr. Ivery normal. Family history of colon cancer in father, paternal grandfather, and 2 paternal uncles. He was last seen in Aug 2023 with heme positive stool and recommended colonoscopy but lost to follow-up.   Wife had gotten sick and passed away 04/01/23. Has 4 boys.   No overt GI bleeding. Has LLQ abdominal discomfort about twice per week, no obvious triggers, goes away on own. Symptoms for about 6 months. No fever or chills. BM can be up to 3-4 days without BM but lately having BM daily for last 3-4 days. Taking small little blue pill from OTC that he said is a mild laxative. Sometimes takes a deep breath, bends over, and then can have BM. Tries to avoid straining. Sometimes small pieces, sometimes good size.   No unexplained weight loss or lack of appetite. No GERD or dysphagia.   Believes he tried Linzess  290 mcg daily at last appt and liked this.   Semi-retired.        Past Medical History:  Diagnosis Date   Abdominal hernia    Arthritis    Bronchitis    Claustrophobia    COPD (chronic obstructive pulmonary disease) (HCC)    Diabetes mellitus without complication (HCC)    Nonhealing surgical wound    nonviable tissue   PAD (peripheral artery disease)     Past Surgical History:  Procedure Laterality Date   ABDOMINAL AORTOGRAM W/LOWER EXTREMITY N/A 01/31/2019   Procedure: ABDOMINAL AORTOGRAM W/LOWER EXTREMITY;  Surgeon: Sheree Penne Bruckner, MD;  Location: [facility] INVASIVE CV LAB;  Service:  Cardiovascular;  Laterality: N/A;   AMPUTATION Right 02/01/2019   Procedure: AMPUTATION RIGHT FIFTH TOE;  Surgeon: Eliza Bruckner RAMAN, MD;  Location: [facility] OR;  Service: Vascular;  Laterality: Right;   AMPUTATION Right 02/10/2019   Procedure: AMPUTATION RAY RIGHT 5TH TOE WITH DEBRIDEMENT WOUND BED;  Surgeon: Sheree Penne Bruckner, MD;  Location: [facility] OR;  Service: Vascular;  Laterality: Right;   AMPUTATION TOE Right 02/10/2019   5TH    CARPAL TUNNEL RELEASE Left    COLONOSCOPY  2019   Dr. Ivery: normal   PERIPHERAL VASCULAR INTERVENTION Right 01/31/2019   Procedure: PERIPHERAL VASCULAR INTERVENTION;  Surgeon: Sheree Penne Bruckner, MD;  Location: [facility] INVASIVE CV LAB;  Service: Cardiovascular;  Laterality: Right;  SFA   SHOULDER SURGERY Left    TOOTH EXTRACTION N/A 01/31/2022   Procedure: DENTAL RESTORATION/EXTRACTIONS;  Surgeon: Sheryle Hamilton, DMD;  Location: [facility] OR;  Service: Oral Surgery;  Laterality: N/A;    Current Outpatient Medications  Medication Sig Dispense Refill   celecoxib (CELEBREX) 200 MG capsule Take 200 mg by mouth daily.     clopidogrel  (PLAVIX ) 75 MG tablet Take 1 tablet (75 mg total) by mouth daily with breakfast. 30 tablet 2   dapagliflozin propanediol (FARXIGA) 10 MG TABS tablet Take 10 mg by mouth daily.     gabapentin  (NEURONTIN ) 600 MG tablet Take 600 mg by mouth 3 (three) times daily.  0   glipiZIDE  (GLUCOTROL  XL) 10 MG 24 hr tablet Take 10 mg by mouth daily.     metFORMIN  (GLUCOPHAGE -XR) 500 MG 24 hr tablet Take 1,000 mg by mouth 2 (two) times daily.     sildenafil (REVATIO) 20 MG tablet Take 20 mg by mouth daily as needed for erectile dysfunction.     simvastatin  (ZOCOR ) 40 MG tablet Take 40 mg by mouth every evening.      SYMBICORT 160-4.5 MCG/ACT inhaler 2 puffs daily as needed (COPD).     tamsulosin (FLOMAX) 0.4 MG CAPS capsule Take 0.4 mg by mouth at bedtime.     Exenatide ER (BYDUREON) 2 MG PEN Inject 2 mg as directed once a week. (Patient not  taking: Reported on 12/06/2024)     No current facility-administered medications for this visit.    Allergies as of 12/06/2024 - Review Complete 12/06/2024  Allergen Reaction Noted   Jardiance [empagliflozin] Rash 07/24/2017   Sulfa antibiotics Rash 07/24/2017    Family History  Problem Relation Age of Onset   Diabetes Mother    Heart failure Mother    Colon cancer Father    Diabetes Brother    Colon cancer Paternal Grandfather    Asthma Son    Healthy Son    Healthy Son    Healthy Son    Healthy Son     Social History   Socioeconomic History   Marital status: Widowed    Spouse name: Not on file   Number of children: Not on file   Years of education: Not on file   Highest education level: Not on file  Occupational History   Not on file  Tobacco Use   Smoking status: Every Day    Current packs/day: 0.75    Average packs/day: 0.8 packs/day for 40.0 years (30.0 ttl pk-yrs)    Types: Cigarettes   Smokeless tobacco: Never  Vaping Use   Vaping status: Never Used  Substance and Sexual Activity   Alcohol use: Yes    Comment: Rarely   Drug use: No   Sexual activity: Not Currently    Birth control/protection: None  Other Topics Concern   Not on file  Social History Narrative   Not on file   Social Drivers of Health   Financial Resource Strain: Not on file  Food Insecurity: Not on file  Transportation Needs: Not on file  Physical Activity: Not on file  Stress: Not on file  Social Connections: Not on file  Intimate Partner Violence: Not on file     Review of Systems   Gen: Denies any fever, chills, fatigue, weight loss, lack of appetite.  CV: Denies chest pain, heart palpitations, peripheral edema, syncope.  Resp: Denies shortness of breath at rest or with exertion. Denies wheezing or cough.  GI: Denies dysphagia or odynophagia. Denies jaundice, hematemesis, fecal incontinence. GU : Denies urinary burning, urinary frequency, urinary hesitancy MS: Denies  joint pain, muscle weakness, cramps, or limitation of movement.  Derm: Denies rash, itching, dry skin Psych: Denies depression, anxiety, memory loss, and confusion Heme: Denies bruising, bleeding, and enlarged lymph nodes.   Physical Exam   BP 119/68   Pulse 67   Temp 98.2 F (36.8 C)   Ht 5' 10 (1.778 m)   Wt 272 lb (123.4 kg)   BMI 39.03 kg/m  General:   Alert and oriented. Pleasant and cooperative. Well-nourished and well-developed.  Head:  Normocephalic and atraumatic. Eyes:  Without icterus Abdomen:  +BS, soft, non-tender  and non-distended. No HSM noted. No guarding or rebound. No masses appreciated.  Rectal:  Deferred  Msk:  Symmetrical without gross deformities. Normal posture. Extremities:  Without edema. Neurologic:  Alert and  oriented x4;  grossly normal neurologically. Skin:  Intact without significant lesions or rashes. Psych:  Alert and cooperative. Normal mood and affect.   Assessment   Derrick Clark is a 63 y.o. male presenting today with a history of    PLAN   *****    Therisa MICAEL Stager, PhD, ANP-BC [facility] Gastroenterology

## 2024-12-06 NOTE — Patient Instructions (Signed)
 We are requesting to hold Plavix  for 5 days.  You will also not take glipizide  or metformin  the morning of the procedure. You will need to hold Farxiga for 72 hours prior to the procedure.  I would like for you to start Linzess . This is the highest dosage. Take this on an empty stomach, 30 minutes before breakfast daily. You may have looser stool starting out, but it should get better after about 5 days or so. If not, please call us ! I also sent the prescription to your pharmacy.  We are arranging a colonoscopy in the near future.  We will see you back in 3 months or sooner if needed!   I enjoyed seeing you again today! I value our relationship and want to provide genuine, compassionate, and quality care. You may receive a survey regarding your visit with me, and I welcome your feedback! Thanks so much for taking the time to complete this. I look forward to seeing you again.      Therisa MICAEL Stager, PhD, ANP-BC [facility] Gastroenterology

## 2024-12-07 NOTE — Telephone Encounter (Signed)
Clearance faxed to PCP.

## 2024-12-08 NOTE — Telephone Encounter (Signed)
 Clearance scanned under media tab

## 2024-12-09 NOTE — Telephone Encounter (Signed)
 Noted. Patient can hold Plavix  X 5 days. May arrange colonoscopy with Dr. Shaaron, ASA 3. Hold Plavix  X 5 days.

## 2024-12-12 ENCOUNTER — Encounter: Payer: Self-pay | Admitting: *Deleted

## 2024-12-12 ENCOUNTER — Other Ambulatory Visit: Payer: Self-pay | Admitting: *Deleted

## 2024-12-12 MED ORDER — PEG 3350-KCL-NA BICARB-NACL 420 G PO SOLR: 4000.0000 mL | Freq: Once | ORAL | 0 refills | Status: AC

## 2024-12-12 NOTE — Telephone Encounter (Signed)
 Pt has been scheduled for 01/04/25. Instructions mailed and prep sent to pharmacy.

## 2024-12-30 ENCOUNTER — Encounter (HOSPITAL_COMMUNITY): Payer: Self-pay

## 2024-12-30 ENCOUNTER — Other Ambulatory Visit: Payer: Self-pay

## 2024-12-30 ENCOUNTER — Encounter (HOSPITAL_COMMUNITY)
Admission: RE | Admit: 2024-12-30 | Discharge: 2024-12-30 | Disposition: A | Discharge status: Home / Self Care | Source: Ambulatory Visit | Attending: Internal Medicine | Admitting: Internal Medicine

## 2025-01-02 ENCOUNTER — Telehealth: Payer: Self-pay | Admitting: *Deleted

## 2025-01-02 NOTE — Telephone Encounter (Signed)
 Patient called in. He has been sick all weekend, congestion, cough, sore throat, headache, fever. Has scheduled procedure 1/7 with Dr. Shaaron and he has been rescheduled to 1/28 at 7:30am. Patient aware will send new instructions and message also sent to endo making aware.

## 2025-01-20 ENCOUNTER — Encounter (HOSPITAL_COMMUNITY)
Admission: RE | Admit: 2025-01-20 | Discharge: 2025-01-20 | Disposition: A | Source: Ambulatory Visit | Attending: Internal Medicine

## 2025-01-25 ENCOUNTER — Ambulatory Visit (HOSPITAL_COMMUNITY)
Admission: RE | Admit: 2025-01-25 | Discharge: 2025-01-25 | Disposition: A | Discharge status: Home / Self Care | Attending: Internal Medicine | Admitting: Internal Medicine

## 2025-01-25 ENCOUNTER — Ambulatory Visit (HOSPITAL_COMMUNITY): Admitting: Anesthesiology

## 2025-01-25 ENCOUNTER — Encounter (HOSPITAL_COMMUNITY)
Admission: RE | Disposition: A | Discharge status: Home / Self Care | Payer: Self-pay | Source: Home / Self Care | Attending: Internal Medicine

## 2025-01-25 DIAGNOSIS — D126 Benign neoplasm of colon, unspecified: Secondary | ICD-10-CM | POA: Diagnosis not present

## 2025-01-25 DIAGNOSIS — F419 Anxiety disorder, unspecified: Secondary | ICD-10-CM | POA: Insufficient documentation

## 2025-01-25 DIAGNOSIS — Z1211 Encounter for screening for malignant neoplasm of colon: Secondary | ICD-10-CM | POA: Insufficient documentation

## 2025-01-25 DIAGNOSIS — D127 Benign neoplasm of rectosigmoid junction: Secondary | ICD-10-CM | POA: Insufficient documentation

## 2025-01-25 DIAGNOSIS — E1151 Type 2 diabetes mellitus with diabetic peripheral angiopathy without gangrene: Secondary | ICD-10-CM | POA: Insufficient documentation

## 2025-01-25 DIAGNOSIS — F32A Depression, unspecified: Secondary | ICD-10-CM | POA: Insufficient documentation

## 2025-01-25 DIAGNOSIS — J449 Chronic obstructive pulmonary disease, unspecified: Secondary | ICD-10-CM | POA: Insufficient documentation

## 2025-01-25 DIAGNOSIS — Z7984 Long term (current) use of oral hypoglycemic drugs: Secondary | ICD-10-CM | POA: Diagnosis not present

## 2025-01-25 DIAGNOSIS — D128 Benign neoplasm of rectum: Secondary | ICD-10-CM | POA: Diagnosis not present

## 2025-01-25 DIAGNOSIS — K635 Polyp of colon: Secondary | ICD-10-CM

## 2025-01-25 DIAGNOSIS — Z800 Family history of malignant neoplasm of digestive organs: Secondary | ICD-10-CM | POA: Diagnosis not present

## 2025-01-25 DIAGNOSIS — F17210 Nicotine dependence, cigarettes, uncomplicated: Secondary | ICD-10-CM | POA: Diagnosis not present

## 2025-01-25 DIAGNOSIS — K5730 Diverticulosis of large intestine without perforation or abscess without bleeding: Secondary | ICD-10-CM | POA: Diagnosis not present

## 2025-01-25 MED ORDER — PROPOFOL 10 MG/ML IV BOLUS
INTRAVENOUS | Status: DC | PRN
  Administered 2025-01-25: 100 mg via INTRAVENOUS

## 2025-01-25 MED ORDER — EPHEDRINE SULFATE (PRESSORS) 25 MG/5ML IV SOSY
PREFILLED_SYRINGE | INTRAVENOUS | Status: DC | PRN
  Administered 2025-01-25: 10 mg via INTRAVENOUS
  Administered 2025-01-25: 5 mg via INTRAVENOUS

## 2025-01-25 MED ORDER — PHENYLEPHRINE 80 MCG/ML (10ML) SYRINGE FOR IV PUSH (FOR BLOOD PRESSURE SUPPORT)
PREFILLED_SYRINGE | INTRAVENOUS | Status: DC | PRN
  Administered 2025-01-25 (×3): 160 ug via INTRAVENOUS

## 2025-01-25 MED ORDER — LACTATED RINGERS IV SOLN: INTRAVENOUS | Status: DC | PRN

## 2025-01-25 MED ORDER — LIDOCAINE 2% (20 MG/ML) 5 ML SYRINGE
INTRAMUSCULAR | Status: DC | PRN
  Administered 2025-01-25: 100 mg via INTRAVENOUS

## 2025-01-25 MED ORDER — PROPOFOL 500 MG/50ML IV EMUL
INTRAVENOUS | Status: DC | PRN
  Administered 2025-01-25: 200 ug/kg/min via INTRAVENOUS

## 2025-01-25 NOTE — Op Note (Addendum)
 [facility] Patient Name: Derrick Clark Procedure Date: 01/25/2025 6:59 AM MRN: [id number] Date of Birth: 1961-10-02 Attending MD: Lamar Ozell Hollingshead , MD, [id number] CSN: [id number] Age: 64 Admit Type: Outpatient Procedure:                Colonoscopy Indications:              Screening in patient at increased risk: Family                            history of 1st-degree relative with colorectal                            cancer Providers:                Lamar Ozell Hollingshead, MD, Jon LABOR. Gerome RN, RN,                            Chad Wilson, Technician Referring MD:              Medicines:                Propofol  per Anesthesia Complications:            No immediate complications. Estimated Blood Loss:     Estimated blood loss was minimal. Procedure:                Pre-Anesthesia Assessment:                           - Prior to the procedure, a History and Physical                            was performed, and patient medications and                            allergies were reviewed. The patient's tolerance of                            previous anesthesia was also reviewed. The risks                            and benefits of the procedure and the sedation                            options and risks were discussed with the patient.                            All questions were answered, and informed consent                            was obtained. Prior Anticoagulants: The patient                            last took Plavix  (clopidogrel ) 5 days prior to the  procedure. ASA Grade Assessment: III - A patient                            with severe systemic disease. After reviewing the                            risks and benefits, the patient was deemed in                            satisfactory condition to undergo the procedure.                           After obtaining informed consent, the colonoscope                            was passed under  direct vision. Throughout the                            procedure, the patient's blood pressure, pulse, and                            oxygen saturations were monitored continuously. The                            [id number] ([id number]) Colon was introduced through                            the anus and advanced to the the cecum, identified                            by appendiceal orifice and ileocecal valve. The                            colonoscopy was performed without difficulty. The                            patient tolerated the procedure well. The quality                            of the bowel preparation was adequate. The                            ileocecal valve, appendiceal orifice, and rectum                            were photographed. Scope In: 7:35:49 AM Scope Out: 8:01:02 AM Scope Withdrawal Time: 0 hours 17 minutes 58 seconds  Total Procedure Duration: 0 hours 25 minutes 13 seconds  Findings:      The perianal and digital rectal examinations were normal.      Scattered medium-mouthed diverticula were found in the sigmoid colon and       descending colon. Redundant and elongated colon.      A 10 mm polyp was found in the distal rectum. The polyp was sessile. The  polyp was removed with a hot snare. Resection and retrieval were       complete. Estimated blood loss was minimal. There were 4 diminutive       polyps adjacent to the 10 mm polyp in the rectum they were ablated with       the hot snare tip. This large rectal polyp was 3 cm in from the anal       verge. Pura stat was applied to the base to assure hemostasis.      A 4 mm polyp was found in the recto-sigmoid colon. The polyp was       sessile. It was cold snared.      The exam was otherwise without abnormality on direct and retroflexion       views. Impression:               - Diverticulosis in the sigmoid colon and in the                            descending colon.                           - One 10 mm  polyp in the distal rectum, removed                            with a hot snare. Resected and retrieved. Purastat                            applied to the base.                           - One 4 mm polyp at the recto-sigmoid colon. Cold                            snare removed.                           - Diminutive rectal polyps ablated.                           - The examination was otherwise normal on direct                            and retroflexion views. Moderate Sedation:      Moderate (conscious) sedation was personally administered by an       anesthesia professional. The following parameters were monitored: oxygen       saturation, heart rate, blood pressure, respiratory rate, EKG, adequacy       of pulmonary ventilation, and response to care. Recommendation:           - Patient has a contact number available for                            emergencies. The signs and symptoms of potential                            delayed complications were discussed with the  patient. Return to normal activities tomorrow.                            Written discharge instructions were provided to the                            patient.                           - Advance diet as tolerated.                           - Continue present medications.                           - Repeat colonoscopy date to be determined after                            pending pathology results are reviewed for                            surveillance.                           - Return to GI office (date not yet determined). Procedure Code(s):        --- Professional ---                           [phone], Colonoscopy, flexible; with removal of                            tumor(s), polyp(s), or other lesion(s) by snare                            technique Diagnosis Code(s):        --- Professional ---                           Z80.0, Family history of malignant neoplasm of                             digestive organs                           D12.8, Benign neoplasm of rectum                           D12.7, Benign neoplasm of rectosigmoid junction                           K57.30, Diverticulosis of large intestine without                            perforation or abscess without bleeding CPT copyright 2022 American Medical Association. All rights reserved. The codes documented in this report are preliminary and upon coder review may  be revised to meet current compliance requirements. Lamar HERO. Kerin Kren,  MD Lamar Ozell Hollingshead, MD 01/25/2025 8:11:33 AM This report has been signed electronically. Number of Addenda: 0

## 2025-01-25 NOTE — Anesthesia Preprocedure Evaluation (Signed)
"                                    Anesthesia Evaluation  Patient identified by MRN, date of birth, ID band Patient awake    Reviewed: Allergy & Precautions, H&P , NPO status , Patient's Chart, lab work & pertinent test results, reviewed documented beta blocker date and time   Airway Mallampati: II  TM Distance: >3 FB Neck ROM: full    Dental no notable dental hx.    Pulmonary COPD, Current Smoker and Patient abstained from smoking.   Pulmonary exam normal breath sounds clear to auscultation       Cardiovascular Exercise Tolerance: Good hypertension, + Peripheral Vascular Disease   Rhythm:regular Rate:Normal     Neuro/Psych  PSYCHIATRIC DISORDERS Anxiety Depression     Neuromuscular disease    GI/Hepatic negative GI ROS, Neg liver ROS,,,  Endo/Other  diabetes    Renal/GU negative Renal ROS  negative genitourinary   Musculoskeletal   Abdominal   Peds  Hematology negative hematology ROS (+)   Anesthesia Other Findings   Reproductive/Obstetrics negative OB ROS                              Anesthesia Physical Anesthesia Plan  ASA: 3  Anesthesia Plan: MAC   Post-op Pain Management:    Induction:   PONV Risk Score and Plan: Propofol  infusion  Airway Management Planned:   Additional Equipment:   Intra-op Plan:   Post-operative Plan:   Informed Consent: I have reviewed the patients History and Physical, chart, labs and discussed the procedure including the risks, benefits and alternatives for the proposed anesthesia with the patient or authorized representative who has indicated his/her understanding and acceptance.     Dental Advisory Given  Plan Discussed with: CRNA  Anesthesia Plan Comments:         Anesthesia Quick Evaluation  "

## 2025-01-25 NOTE — Anesthesia Postprocedure Evaluation (Signed)
"   Anesthesia Post Note  Patient: Derrick Clark  Procedure(s) Performed: COLONOSCOPY POLYPECTOMY, INTESTINE  Patient location during evaluation: Phase II Anesthesia Type: MAC Level of consciousness: awake Pain management: pain level controlled Vital Signs Assessment: post-procedure vital signs reviewed and stable Respiratory status: spontaneous breathing and respiratory function stable Cardiovascular status: blood pressure returned to baseline and stable Postop Assessment: no headache and no apparent nausea or vomiting Anesthetic complications: no Comments: Late entry   No notable events documented.   Last Vitals:  Vitals:   01/25/25 0811 01/25/25 0819  BP: (!) 99/52 111/69  Pulse: 76   Resp: 16   Temp: 36.7 C   SpO2: 97%     Last Pain:  Vitals:   01/25/25 0811  TempSrc: Oral  PainSc: 0-No pain                 Yvonna PARAS Barre Aydelott      "

## 2025-01-25 NOTE — H&P (Signed)
 @LOGO @   Gastroenterology Progress Note    Primary Care Physician:  Toribio Jerel MATSU, MD Primary Gastroenterologist:  Dr. Shaaron  Pre-Procedure History & Physical: HPI:  Derrick Clark is a 64 y.o. male here for diagnostic colonoscopy and Hemoccult positive stool 3 years ago but no colonoscopy.  Positive family history and 1 first-degree relative in 3 second-degree relatives.  Reported negative colonoscopy 2019.  Past Medical History:  Diagnosis Date   Abdominal hernia    Arthritis    Bronchitis    Claustrophobia    COPD (chronic obstructive pulmonary disease) (HCC)    Diabetes mellitus without complication (HCC)    Nonhealing surgical wound    nonviable tissue   PAD (peripheral artery disease)     Past Surgical History:  Procedure Laterality Date   ABDOMINAL AORTOGRAM W/LOWER EXTREMITY N/A 01/31/2019   Procedure: ABDOMINAL AORTOGRAM W/LOWER EXTREMITY;  Surgeon: Sheree Penne Bruckner, MD;  Location: [facility] INVASIVE CV LAB;  Service: Cardiovascular;  Laterality: N/A;   AMPUTATION Right 02/01/2019   Procedure: AMPUTATION RIGHT FIFTH TOE;  Surgeon: Eliza Bruckner RAMAN, MD;  Location: [facility] OR;  Service: Vascular;  Laterality: Right;   AMPUTATION Right 02/10/2019   Procedure: AMPUTATION RAY RIGHT 5TH TOE WITH DEBRIDEMENT WOUND BED;  Surgeon: Sheree Penne Bruckner, MD;  Location: [facility] OR;  Service: Vascular;  Laterality: Right;   AMPUTATION TOE Right 02/10/2019   5TH    CARPAL TUNNEL RELEASE Left    COLONOSCOPY  2019   Dr. Ivery: normal   PERIPHERAL VASCULAR INTERVENTION Right 01/31/2019   Procedure: PERIPHERAL VASCULAR INTERVENTION;  Surgeon: Sheree Penne Bruckner, MD;  Location: [facility] INVASIVE CV LAB;  Service: Cardiovascular;  Laterality: Right;  SFA   SHOULDER SURGERY Left    TOOTH EXTRACTION N/A 01/31/2022   Procedure: DENTAL RESTORATION/EXTRACTIONS;  Surgeon: Sheryle Hamilton, DMD;  Location: [facility] OR;  Service: Oral Surgery;  Laterality: N/A;    Prior to Admission  medications  Medication Sig Start Date End Date Taking? Authorizing Provider  celecoxib (CELEBREX) 200 MG capsule Take 200 mg by mouth daily. 12/06/20  Yes [provider]  gabapentin  (NEURONTIN ) 600 MG tablet Take 600 mg by mouth 3 (three) times daily. 04/17/17  Yes [provider]  glipiZIDE  (GLUCOTROL  XL) 10 MG 24 hr tablet Take 10 mg by mouth daily.   Yes [provider]  metFORMIN  (GLUCOPHAGE -XR) 500 MG 24 hr tablet Take 1,000 mg by mouth 2 (two) times daily. 01/11/19  Yes [provider]  simvastatin  (ZOCOR ) 40 MG tablet Take 40 mg by mouth every evening.    Yes [provider]  SYMBICORT 160-4.5 MCG/ACT inhaler 2 puffs daily as needed (COPD). 07/16/21  Yes [provider]  tamsulosin (FLOMAX) 0.4 MG CAPS capsule Take 0.4 mg by mouth at bedtime. 08/31/21  Yes [provider]  clopidogrel  (PLAVIX ) 75 MG tablet Take 1 tablet (75 mg total) by mouth daily with breakfast. 02/02/19   Gerome Maurilio HERO, PA-C  dapagliflozin propanediol (FARXIGA) 10 MG TABS tablet Take 10 mg by mouth daily.    [provider]  Exenatide ER (BYDUREON) 2 MG PEN Inject 2 mg as directed once a week. Patient not taking: Reported on 12/06/2024 03/17/20   [provider]  linaclotide  (LINZESS ) 290 MCG CAPS capsule Take 1 capsule (290 mcg total) by mouth daily before breakfast. 30 minutes before breakfast 12/06/24   Shirlean Therisa ORN, NP  sildenafil (REVATIO) 20 MG tablet Take 20 mg by mouth daily as needed for erectile dysfunction. 12/04/21  [provider]    Allergies as of 12/12/2024 - Review Complete 12/06/2024  Allergen Reaction Noted   Jardiance [empagliflozin] Rash 07/24/2017   Sulfa antibiotics Rash 07/24/2017    Family History  Problem Relation Age of Onset   Diabetes Mother    Heart failure Mother    Colon cancer Father    Diabetes Brother    Colon cancer Paternal Grandfather    Asthma Son    Healthy Son    Healthy Son     Healthy Son    Healthy Son     Social History   Socioeconomic History   Marital status: Widowed    Spouse name: Not on file   Number of children: Not on file   Years of education: Not on file   Highest education level: Not on file  Occupational History   Not on file  Tobacco Use   Smoking status: Every Day    Current packs/day: 0.75    Average packs/day: 0.8 packs/day for 40.0 years (30.0 ttl pk-yrs)    Types: Cigarettes   Smokeless tobacco: Never  Vaping Use   Vaping status: Never Used  Substance and Sexual Activity   Alcohol use: Yes    Comment: Rarely   Drug use: No   Sexual activity: Not Currently    Birth control/protection: None  Other Topics Concern   Not on file  Social History Narrative   Not on file   Social Drivers of Health   Tobacco Use: High Risk (12/30/2024)   Patient History    Smoking Tobacco Use: Every Day    Smokeless Tobacco Use: Never    Passive Exposure: Not on file  Financial Resource Strain: Not on file  Food Insecurity: Not on file  Transportation Needs: Not on file  Physical Activity: Not on file  Stress: Not on file  Social Connections: Not on file  Intimate Partner Violence: Not on file  Depression (EYV7-0): Not on file  Alcohol Screen: Not on file  Housing: Not on file  Utilities: Not on file  Health Literacy: Not on file    Review of Systems   See HPI, otherwise negative ROS  Physical Exam: BP 138/70   Pulse 69   Temp 98.4 F (36.9 C) (Oral)   Resp 12   Ht 5' 10 (1.778 m)   Wt 117.5 kg   SpO2 99%   BMI 37.16 kg/m  General:   Alert,  Well-developed, well-nourished, pleasant and cooperative in NAD Neck:  Supple; no masses or thyromegaly. No significant cervical adenopathy. Lungs:  Clear throughout to auscultation.   No wheezes, crackles, or rhonchi. No acute distress. Heart:  Regular rate and rhythm; no murmurs, clicks, rubs,  or gallops. Abdomen: Non-distended, normal bowel sounds.  Soft and nontender without  appreciable mass or hepatosplenomegaly.    Impression/Plan:   64 year old gentleman somewhat distant Hemoccult positive stool work no work up until now.  Positive family history of colon cancer and 1 first-degree relative and 3 secondary relatives.  Here for colonoscopy.The risks, benefits, limitations, alternatives and imponderables have been reviewed with the patient. Questions have been answered. All parties are agreeable.      Notice: This dictation was prepared with Dragon dictation along with smaller phrase technology. Any transcriptional errors that result from this process are unintentional and may not be corrected upon review.

## 2025-01-25 NOTE — Discharge Instructions (Signed)
" °  Colonoscopy Discharge Instructions  Read the instructions outlined below and refer to this sheet in the next few weeks. These discharge instructions provide you with general information on caring for yourself after you leave the hospital. Your doctor may also give you specific instructions. While your treatment has been planned according to the most current medical practices available, unavoidable complications occasionally occur. If you have any problems or questions after discharge, call Dr. Shaaron at [phone]. ACTIVITY You may resume your regular activity, but move at a slower pace for the next 24 hours.  Take frequent rest periods for the next 24 hours.  Walking will help get rid of the air and reduce the bloated feeling in your belly (abdomen).  No driving for 24 hours (because of the medicine (anesthesia) used during the test).   Do not sign any important legal documents or operate any machinery for 24 hours (because of the anesthesia used during the test).  NUTRITION Drink plenty of fluids.  You may resume your normal diet as instructed by your doctor.  Begin with a light meal and progress to your normal diet. Heavy or fried foods are harder to digest and may make you feel sick to your stomach (nauseated).  Avoid alcoholic beverages for 24 hours or as instructed.  MEDICATIONS You may resume your normal medications unless your doctor tells you otherwise.  WHAT YOU CAN EXPECT TODAY Some feelings of bloating in the abdomen.  Passage of more gas than usual.  Spotting of blood in your stool or on the toilet paper.  IF YOU HAD POLYPS REMOVED DURING THE COLONOSCOPY: No aspirin  products for 7 days or as instructed.  No alcohol for 7 days or as instructed.  Eat a soft diet for the next 24 hours.  FINDING OUT THE RESULTS OF YOUR TEST Not all test results are available during your visit. If your test results are not back during the visit, make an appointment with your caregiver to find out the  results. Do not assume everything is normal if you have not heard from your caregiver or the medical facility. It is important for you to follow up on all of your test results.  SEEK IMMEDIATE MEDICAL ATTENTION IF: You have more than a spotting of blood in your stool.  Your belly is swollen (abdominal distention).  You are nauseated or vomiting.  You have a temperature over 101.  You have abdominal pain or discomfort that is severe or gets worse throughout the day.    Diverticulosis present.  Multiple polyps found and removed or ablated (destroyed)  Further recommendations to follow pending review of pathology report.  At patient request, I called tillmon kisling [phone].  Reviewed findings and recommendations "

## 2025-01-25 NOTE — Transfer of Care (Signed)
 Immediate Anesthesia Transfer of Care Note  Patient: Derrick Clark  Procedure(s) Performed: COLONOSCOPY POLYPECTOMY, INTESTINE  Patient Location: Short Stay  Anesthesia Type:General  Level of Consciousness: awake, alert , oriented, and patient cooperative  Airway & Oxygen Therapy: Patient Spontanous Breathing  Post-op Assessment: Report given to RN, Post -op Vital signs reviewed and stable, and Patient moving all extremities X 4  Post vital signs: Reviewed and stable  Last Vitals:  Vitals Value Taken Time  BP    Temp    Pulse    Resp    SpO2      Last Pain:  Vitals:   01/25/25 0729  TempSrc:   PainSc: 0-No pain      Patients Stated Pain Goal: 7 (01/25/25 0706)  Complications: No notable events documented.

## 2025-01-26 ENCOUNTER — Encounter (HOSPITAL_COMMUNITY): Payer: Self-pay | Admitting: Internal Medicine

## 2025-01-26 ENCOUNTER — Ambulatory Visit: Payer: Self-pay | Admitting: Internal Medicine

## 2025-01-26 LAB — SURGICAL PATHOLOGY
